# Patient Record
Sex: Female | Born: 1985 | Race: Black or African American | Hispanic: No | Marital: Single | Smoking: Former smoker
Health system: Southern US, Community
[De-identification: ages and names within clinical notes are randomized; demographics above are authoritative.]

## PROBLEM LIST (undated history)

## (undated) ENCOUNTER — Inpatient Hospital Stay (HOSPITAL_COMMUNITY): Payer: Self-pay

## (undated) DIAGNOSIS — K649 Unspecified hemorrhoids: Secondary | ICD-10-CM

## (undated) DIAGNOSIS — Z21 Asymptomatic human immunodeficiency virus [HIV] infection status: Secondary | ICD-10-CM

## (undated) DIAGNOSIS — F32A Depression, unspecified: Secondary | ICD-10-CM

## (undated) DIAGNOSIS — F329 Major depressive disorder, single episode, unspecified: Secondary | ICD-10-CM

## (undated) HISTORY — PX: DILATION AND CURETTAGE OF UTERUS: SHX78

## (undated) HISTORY — PX: SALPINGOOPHORECTOMY: SHX82

---
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
## 2006-02-20 ENCOUNTER — Emergency Department (HOSPITAL_COMMUNITY): Admission: EM | Admit: 2006-02-20 | Discharge: 2006-02-20 | Payer: Self-pay | Admitting: Family Medicine

## 2006-12-22 ENCOUNTER — Inpatient Hospital Stay (HOSPITAL_COMMUNITY): Admission: AD | Admit: 2006-12-22 | Discharge: 2006-12-22 | Payer: Self-pay | Admitting: Obstetrics

## 2007-01-03 ENCOUNTER — Ambulatory Visit (HOSPITAL_COMMUNITY): Admission: RE | Admit: 2007-01-03 | Discharge: 2007-01-03 | Payer: Self-pay | Admitting: Obstetrics & Gynecology

## 2007-02-21 ENCOUNTER — Ambulatory Visit (HOSPITAL_COMMUNITY): Admission: RE | Admit: 2007-02-21 | Discharge: 2007-02-21 | Payer: Self-pay | Admitting: Obstetrics & Gynecology

## 2007-04-04 ENCOUNTER — Inpatient Hospital Stay (HOSPITAL_COMMUNITY): Admission: AD | Admit: 2007-04-04 | Discharge: 2007-04-04 | Payer: Self-pay | Admitting: Obstetrics & Gynecology

## 2007-04-15 ENCOUNTER — Ambulatory Visit (HOSPITAL_COMMUNITY): Admission: RE | Admit: 2007-04-15 | Discharge: 2007-04-15 | Payer: Self-pay | Admitting: Obstetrics & Gynecology

## 2007-05-20 ENCOUNTER — Inpatient Hospital Stay (HOSPITAL_COMMUNITY): Admission: AD | Admit: 2007-05-20 | Discharge: 2007-05-20 | Payer: Self-pay | Admitting: Obstetrics

## 2007-05-26 ENCOUNTER — Inpatient Hospital Stay (HOSPITAL_COMMUNITY): Admission: AD | Admit: 2007-05-26 | Discharge: 2007-05-28 | Payer: Self-pay | Admitting: Obstetrics

## 2007-06-01 ENCOUNTER — Inpatient Hospital Stay (HOSPITAL_COMMUNITY): Admission: AD | Admit: 2007-06-01 | Discharge: 2007-06-01 | Payer: Self-pay | Admitting: Obstetrics & Gynecology

## 2007-06-28 ENCOUNTER — Inpatient Hospital Stay (HOSPITAL_COMMUNITY): Admission: AD | Admit: 2007-06-28 | Discharge: 2007-06-30 | Payer: Self-pay | Admitting: Obstetrics & Gynecology

## 2007-06-28 ENCOUNTER — Encounter: Payer: Self-pay | Admitting: Obstetrics & Gynecology

## 2008-01-30 ENCOUNTER — Emergency Department (HOSPITAL_COMMUNITY): Admission: EM | Admit: 2008-01-30 | Discharge: 2008-01-30 | Payer: Self-pay | Admitting: Emergency Medicine

## 2008-07-06 ENCOUNTER — Emergency Department (HOSPITAL_COMMUNITY): Admission: EM | Admit: 2008-07-06 | Discharge: 2008-07-07 | Payer: Self-pay | Admitting: Emergency Medicine

## 2008-08-27 ENCOUNTER — Emergency Department (HOSPITAL_COMMUNITY): Admission: EM | Admit: 2008-08-27 | Discharge: 2008-08-27 | Payer: Self-pay | Admitting: Emergency Medicine

## 2008-12-04 ENCOUNTER — Emergency Department (HOSPITAL_COMMUNITY): Admission: EM | Admit: 2008-12-04 | Discharge: 2008-12-04 | Payer: Self-pay | Admitting: Emergency Medicine

## 2009-05-27 ENCOUNTER — Emergency Department (HOSPITAL_COMMUNITY): Admission: EM | Admit: 2009-05-27 | Discharge: 2009-05-27 | Payer: Self-pay | Admitting: Emergency Medicine

## 2009-05-27 ENCOUNTER — Inpatient Hospital Stay (HOSPITAL_COMMUNITY): Admission: AD | Admit: 2009-05-27 | Discharge: 2009-05-27 | Payer: Self-pay | Admitting: Obstetrics

## 2009-06-04 ENCOUNTER — Encounter (INDEPENDENT_AMBULATORY_CARE_PROVIDER_SITE_OTHER): Payer: Self-pay | Admitting: *Deleted

## 2009-06-04 DIAGNOSIS — B20 Human immunodeficiency virus [HIV] disease: Secondary | ICD-10-CM

## 2009-06-04 LAB — CONVERTED CEMR LAB
Platelets: 228 10*3/uL
WBC: 7 10*3/uL

## 2009-06-28 ENCOUNTER — Encounter (INDEPENDENT_AMBULATORY_CARE_PROVIDER_SITE_OTHER): Payer: Self-pay | Admitting: *Deleted

## 2009-06-28 LAB — CONVERTED CEMR LAB
CD4 T Helper %: 28 %
HIV 1 RNA Quant: 1490 copies/mL
WBC: 7 10*3/uL

## 2009-07-08 ENCOUNTER — Encounter (INDEPENDENT_AMBULATORY_CARE_PROVIDER_SITE_OTHER): Payer: Self-pay | Admitting: *Deleted

## 2009-07-08 DIAGNOSIS — Z8619 Personal history of other infectious and parasitic diseases: Secondary | ICD-10-CM | POA: Insufficient documentation

## 2009-07-08 DIAGNOSIS — J45909 Unspecified asthma, uncomplicated: Secondary | ICD-10-CM | POA: Insufficient documentation

## 2009-07-08 DIAGNOSIS — A630 Anogenital (venereal) warts: Secondary | ICD-10-CM | POA: Insufficient documentation

## 2009-07-09 ENCOUNTER — Encounter (INDEPENDENT_AMBULATORY_CARE_PROVIDER_SITE_OTHER): Payer: Self-pay | Admitting: *Deleted

## 2009-07-19 ENCOUNTER — Ambulatory Visit: Payer: Self-pay | Admitting: Internal Medicine

## 2009-07-19 LAB — CONVERTED CEMR LAB
Albumin: 3.8 g/dL (ref 3.5–5.2)
Alkaline Phosphatase: 18 units/L — ABNORMAL LOW (ref 39–117)
BUN: 9 mg/dL (ref 6–23)
Basophils Absolute: 0 10*3/uL (ref 0.0–0.1)
Basophils Relative: 0 % (ref 0–1)
Chlamydia, Swab/Urine, PCR: NEGATIVE
Creatinine, Ser: 0.52 mg/dL (ref 0.40–1.20)
Eosinophils Absolute: 0.3 10*3/uL (ref 0.0–0.7)
Eosinophils Relative: 3 % (ref 0–5)
GC Probe Amp, Urine: NEGATIVE
Glucose, Bld: 81 mg/dL (ref 70–99)
HCT: 31.9 % — ABNORMAL LOW (ref 36.0–46.0)
HIV 1 RNA Quant: 3220 copies/mL — ABNORMAL HIGH (ref <48)
HIV-1 antibody: POSITIVE — AB
Hemoglobin: 10.4 g/dL — ABNORMAL LOW (ref 12.0–15.0)
Hep A Total Ab: NEGATIVE
Hep B S Ab: POSITIVE — AB
MCHC: 32.6 g/dL (ref 30.0–36.0)
Monocytes Absolute: 0.5 10*3/uL (ref 0.1–1.0)
RDW: 12.9 % (ref 11.5–15.5)
Total Bilirubin: 0.2 mg/dL — ABNORMAL LOW (ref 0.3–1.2)

## 2009-07-23 ENCOUNTER — Encounter: Payer: Self-pay | Admitting: Internal Medicine

## 2009-07-31 ENCOUNTER — Encounter: Payer: Self-pay | Admitting: Internal Medicine

## 2009-08-16 ENCOUNTER — Ambulatory Visit: Payer: Self-pay | Admitting: Internal Medicine

## 2009-08-16 ENCOUNTER — Ambulatory Visit (HOSPITAL_COMMUNITY): Admission: RE | Admit: 2009-08-16 | Discharge: 2009-08-16 | Payer: Self-pay | Admitting: Obstetrics & Gynecology

## 2009-09-13 ENCOUNTER — Ambulatory Visit (HOSPITAL_COMMUNITY): Admission: RE | Admit: 2009-09-13 | Discharge: 2009-09-13 | Payer: Self-pay | Admitting: Obstetrics & Gynecology

## 2009-09-19 ENCOUNTER — Encounter (INDEPENDENT_AMBULATORY_CARE_PROVIDER_SITE_OTHER): Payer: Self-pay | Admitting: *Deleted

## 2009-10-07 ENCOUNTER — Ambulatory Visit: Payer: Self-pay | Admitting: Internal Medicine

## 2009-10-07 LAB — CONVERTED CEMR LAB
BUN: 6 mg/dL (ref 6–23)
CO2: 25 meq/L (ref 19–32)
Calcium: 8.8 mg/dL (ref 8.4–10.5)
Chloride: 106 meq/L (ref 96–112)
Creatinine, Ser: 0.53 mg/dL (ref 0.40–1.20)
Glucose, Bld: 71 mg/dL (ref 70–99)
HIV 1 RNA Quant: 84 copies/mL — ABNORMAL HIGH (ref <48)
HIV-1 RNA Quant, Log: 1.92 — ABNORMAL HIGH (ref <1.68)
Hemoglobin: 9.5 g/dL — ABNORMAL LOW (ref 12.0–15.0)
Lymphocytes Relative: 21 % (ref 12–46)
Lymphs Abs: 2 10*3/uL (ref 0.7–4.0)
MCHC: 32.4 g/dL (ref 30.0–36.0)
Monocytes Absolute: 0.7 10*3/uL (ref 0.1–1.0)
Monocytes Relative: 7 % (ref 3–12)
Neutro Abs: 6.3 10*3/uL (ref 1.7–7.7)
RBC: 2.97 M/uL — ABNORMAL LOW (ref 3.87–5.11)

## 2009-11-07 ENCOUNTER — Telehealth (INDEPENDENT_AMBULATORY_CARE_PROVIDER_SITE_OTHER): Payer: Self-pay | Admitting: *Deleted

## 2009-11-20 ENCOUNTER — Ambulatory Visit (HOSPITAL_COMMUNITY): Admission: RE | Admit: 2009-11-20 | Discharge: 2009-11-20 | Payer: Self-pay | Admitting: Obstetrics & Gynecology

## 2009-12-12 ENCOUNTER — Encounter (INDEPENDENT_AMBULATORY_CARE_PROVIDER_SITE_OTHER): Payer: Self-pay | Admitting: *Deleted

## 2009-12-12 ENCOUNTER — Encounter: Payer: Self-pay | Admitting: Internal Medicine

## 2009-12-13 ENCOUNTER — Inpatient Hospital Stay (HOSPITAL_COMMUNITY): Admission: AD | Admit: 2009-12-13 | Discharge: 2009-12-13 | Payer: Self-pay | Admitting: Obstetrics & Gynecology

## 2009-12-16 ENCOUNTER — Inpatient Hospital Stay (HOSPITAL_COMMUNITY): Admission: AD | Admit: 2009-12-16 | Discharge: 2009-12-16 | Payer: Self-pay | Admitting: Obstetrics & Gynecology

## 2009-12-16 ENCOUNTER — Ambulatory Visit: Payer: Self-pay | Admitting: Nurse Practitioner

## 2009-12-24 ENCOUNTER — Encounter: Payer: Self-pay | Admitting: Internal Medicine

## 2009-12-25 ENCOUNTER — Inpatient Hospital Stay (HOSPITAL_COMMUNITY): Admission: AD | Admit: 2009-12-25 | Discharge: 2009-12-26 | Payer: Self-pay | Admitting: Obstetrics & Gynecology

## 2009-12-26 ENCOUNTER — Inpatient Hospital Stay (HOSPITAL_COMMUNITY): Admission: AD | Admit: 2009-12-26 | Discharge: 2009-12-29 | Payer: Self-pay | Admitting: Obstetrics

## 2009-12-31 ENCOUNTER — Telehealth: Payer: Self-pay | Admitting: Internal Medicine

## 2010-01-27 ENCOUNTER — Encounter (INDEPENDENT_AMBULATORY_CARE_PROVIDER_SITE_OTHER): Payer: Self-pay | Admitting: *Deleted

## 2010-02-28 ENCOUNTER — Ambulatory Visit: Payer: Self-pay | Admitting: Internal Medicine

## 2010-04-03 ENCOUNTER — Encounter (INDEPENDENT_AMBULATORY_CARE_PROVIDER_SITE_OTHER): Payer: Self-pay | Admitting: *Deleted

## 2010-04-13 ENCOUNTER — Encounter: Payer: Self-pay | Admitting: Obstetrics & Gynecology

## 2010-04-14 ENCOUNTER — Encounter: Payer: Self-pay | Admitting: Obstetrics & Gynecology

## 2010-04-14 ENCOUNTER — Encounter (INDEPENDENT_AMBULATORY_CARE_PROVIDER_SITE_OTHER): Payer: Self-pay | Admitting: *Deleted

## 2010-04-22 NOTE — Miscellaneous (Signed)
Summary: Bridge Counseling  Clinical Lists Changes  03/29/09-Bridge Counselors have received numerous referrals on client.  Client has missed numerous scheduled Bridge Counseling appts since 07/17/09.  Client may be frustrated or not wanting to receive bridge counseling services due to BC's having to report client to CPS after client left her children home a lone for almost an hour with parental care.  BC's closed client's file and will be willing work with client if she request bridge counseling services in the future.

## 2010-04-22 NOTE — Miscellaneous (Signed)
Summary: Orders Update  Clinical Lists Changes  Orders: Added new Referral order of Misc. Referral (Misc. Ref) - Signed 

## 2010-04-22 NOTE — Progress Notes (Signed)
Summary: Re: new medications  Phone Note Call from Patient   Caller: Patient Summary of Call: Pt called left message on voicemail: " I had my baby on 12-30-09. I would like to switch meds". Left message on machine for pt.  OV needed with labs.  Tomasita Morrow RN  December 31, 2009 11:02 AM   Initial call taken by: Tomasita Morrow RN,  December 31, 2009 11:00 AM

## 2010-04-22 NOTE — Miscellaneous (Signed)
Summary: Bridge Counselor Referral/for missed appt.  Clinical Lists Changes  Orders: Added new Referral order of Misc. Referral (Misc. Ref) - Signed

## 2010-04-22 NOTE — Miscellaneous (Signed)
Summary: HIPAA Restrictions  HIPAA Restrictions   Imported By: Florinda Marker 07/23/2009 15:51:24  _____________________________________________________________________  External Attachment:    Type:   Image     Comment:   External Document

## 2010-04-22 NOTE — Assessment & Plan Note (Signed)
Summary: new 042/preg 11 wks/   CC:  new patient and lab work.  History of Present Illness: Thisis the first ID clinic visit for [name] who is 3 months pregnant and who recently discovered that she is HIV (+). She has 2 other children at which time she as HIV (-). She is currenlty with a partner who is the father of the baby and she states that he told her he was HIV(-).   Preventive Screening-Counseling & Management  Alcohol-Tobacco     Alcohol drinks/day: 0     Smoking Status: current     Packs/Day: 0.5     Year Started: 2002  Caffeine-Diet-Exercise     Caffeine use/day: soda 3 per day     Does Patient Exercise: yes     Type of exercise: walking     Exercise (avg: min/session): 30-60     Times/week: 3  Safety-Violence-Falls     Seat Belt Use: yes      Sexual History:  currently monogamous.        Drug Use:  current and marijuana.     Current Allergies (reviewed today): No known allergies  Past History:  Past Medical History: Asthma  Social History: Sexual History:  currently monogamous Drug Use:  current, marijuana  Review of Systems  The patient denies anorexia, fever, and weight loss.    Vital Signs:  Patient profile:   25 year old female Height:      63 inches (160.02 cm) Weight:      160.0 pounds (72.73 kg) BMI:     28.45 Temp:     98.8 degrees F (37.11 degrees C) oral Pulse rate:   120 / minute BP sitting:   123 / 84  (right arm)  Vitals Entered By: [name] CMA Duncan Dull) (July 19, 2009 2:23 PM) CC: new patient, lab work Is Patient Diabetic? No Pain Assessment Patient in pain? no      Nutritional Status BMI of 25 - 29 = overweight Nutritional Status Detail appetite "good"  Have you ever been in a relationship where you felt threatened, hurt or afraid?No   Does patient need assistance? Functional Status Self care Ambulation Normal   Physical Exam  General:  alert, well-developed, well-nourished, and well-hydrated.   Head:   normocephalic and atraumatic.   Mouth:  pharynx pink and moist.   Lungs:  normal breath sounds.     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 We will obtain labs today including a genotype. Since she is pregnat she will need to initiate therapy. We will await the results of the genotype to determine the regimen.  I discussed the fact that if we can get the VL to <1000 and hopefully to <48 that she can have a normal vaginal delivery, otherwise she will need to have a c-section in order to protect the baby from being infected. I recommended that she be referred to High [facility] OB at [name] as they have experience delivering HIV (+) women and are familiar with the AZT protocols for the women and baby. The infants are usually followed at [name]. This is a decision she and her present MD will need to discuss. She will return in 3 weeks to get the results of her labs and get started on therapy. At that time I will go over potential side effects of the HIV meds. Orders: New  Patient Level III [phone]) T-CBC w/Diff [phone]) T-CD4SP [facility]) (CD4SP) T-Chlamydia  Probe, urine [phone]) T-GC Probe, urine [phone]) T-Comprehensive Metabolic Panel [phone]) T-Hepatitis B Surface Antigen [phone]) T-Hepatitis B Surface Antibody [phone]) T-Hepatitis C Antibody ([id number]) T-Hepatitis A Antibody ([id number]) T-HIV Antibody  (Reflex) [phone]) T-HIV1 Quant rflx Ultra or Genotype ([phone]) T-RPR (Syphilis) ([id number])  Patient Instructions: 1)  Please schedule a follow-up appointment in 3 weeks.

## 2010-04-22 NOTE — Miscellaneous (Signed)
Summary: RW update  Clinical Lists Changes  Observations: Added new observation of RWPARTICIP: Yes (12/12/2009 9:55)

## 2010-04-22 NOTE — Miscellaneous (Signed)
Summary: lab values updated  Clinical Lists Changes  Observations: Added new observation of CREATININE: 0.2 mg/dL (16/12/9602 54:09) Added new observation of PLATELETK/UL: 228 K/uL (06/28/2009 10:29) Added new observation of WBC COUNT: 7.0 10*3/microliter (06/28/2009 10:29) Added new observation of HGB: 11.8 g/dL (81/19/1478 29:56) Added new observation of GLUCOSE SER: 89 mg/dL (21/30/8657 84:69) Added new observation of HIV1RNA QA: 1490 copies/mL (06/28/2009 10:29) Added new observation of T-HELPER %: 28 % (06/28/2009 10:29) Added new observation of CD4 COUNT: 514 microliters (06/28/2009 10:29) Added new observation of PLATELETK/UL: 228 K/uL (06/04/2009 10:29) Added new observation of WBC COUNT: 7.0 10*3/microliter (06/04/2009 10:29) Added new observation of HGB: 11.8 g/dL (62/95/2841 32:44)      -  Date:  06/28/2009    CD4: 514    Glucose: 89

## 2010-04-22 NOTE — Progress Notes (Signed)
Summary: needs f/u  Phone Note Outgoing Call   Summary of Call: [facility] pt. kept. appt. 10/31/09, has another appt. 11/14/09.  Asked them to notify pt. to call this office to reschedule her appt. If pt. does not reschedule will be refer to [facility]. Initial call taken by: [name] CMA Duncan Dull),  November 07, 2009 9:56 AM  Follow-up for Phone Call        Pt. rescheduled for 12/10/09 at 3:15 Follow-up by: [name] CMA Duncan Dull),  November 20, 2009 12:31 PM

## 2010-04-22 NOTE — Consult Note (Signed)
Summary: [facility]  [facility]   Imported By: Florinda Marker 08/08/2009 09:18:38  _____________________________________________________________________  External Attachment:    Type:   Image     Comment:   External Document

## 2010-04-22 NOTE — Assessment & Plan Note (Signed)
Summary: fukam   CC:  follow-up visit and lab results.  History of Present Illness: Pt here for f/u.  She has an ultrasound schedule for this afternoon. She has been feeling well.  Preventive Screening-Counseling & Management  Alcohol-Tobacco     Alcohol drinks/day: 0     Smoking Status: current     Packs/Day: 0.5     Year Started: 2002  Caffeine-Diet-Exercise     Caffeine use/day: soda occasional     Does Patient Exercise: yes     Type of exercise: walking     Exercise (avg: min/session): 30-60     Times/week: 3  Safety-Violence-Falls     Seat Belt Use: yes      Sexual History:  currently monogamous.        Drug Use:  current and marijuana.     Updated Prior Medication List: COMBIVIR 150-300 MG TABS (LAMIVUDINE-ZIDOVUDINE) Take 1 tablet by mouth two times a day KALETRA 200-50 MG TABS (LOPINAVIR-RITONAVIR) Take 2 tablets by mouth two times a day  Current Allergies (reviewed today): No known allergies  Past History:  Past Medical History: Last updated: 07/19/2009 Asthma  Vital Signs:  Patient profile:   25 year old female Height:      63 inches (160.02 cm) Weight:      163.12 pounds (74.15 kg) BMI:     29.00 Temp:     98.2 degrees F (36.78 degrees C) oral Pulse rate:   92 / minute BP sitting:   116 / 76  (right arm)  Vitals Entered By: [name] CMA Duncan Dull) (Aug 16, 2009 10:10 AM) CC: follow-up visit, lab results Is Patient Diabetic? No Pain Assessment Patient in pain? no      Nutritional Status BMI of 25 - 29 = overweight Nutritional Status Detail appetite "great"  Does patient need assistance? Functional Status Self care Ambulation Normal Comments OBGYN was suppose to refer pt. to [facility]. but she has not heard anything back yet Has appt. today at [facility] for an ultrasound   Physical Exam  General:  alert, well-developed, well-nourished, and well-hydrated.   Head:  normocephalic and atraumatic.   Mouth:  pharynx pink and moist.    Lungs:  normal breath sounds.          Medication Adherence: 08/16/2009   Adherence to medications reviewed with patient. Counseling to provide adequate adherence provided   Prevention For Positives: 08/15/2009   Safe sex practices discussed with patient. Condoms offered.                             Impression & Recommendations:  Problem # 1:  HIV INFECTION (ICD-042) Genotype does not show any resistance. Will stat Kaletra and combivir.  Potential side effects discussed.  She will return in 6 weeks for repeat labs.  The need to take her meds every day was stressed. Diagnostics Reviewed:  HIV: REACTIVE (07/19/2009)   HIV-Western blot: Positive (07/19/2009)   WBC: 8.2 (07/19/2009)   Hgb: 10.4 (07/19/2009)   HCT: 31.9 (07/19/2009)   Platelets: 255 (07/19/2009) HIV genotype: See Comment (07/19/2009)   HIV-1 RNA: 3220 (07/19/2009)   HBSAg: NEG (07/19/2009)  Orders: Est. Patient Level III (99213)Future Orders: T-CD4SP ([facility]) (CD4SP) ... 09/27/2009 T-HIV Viral Load [id number]) ... 09/27/2009 T-Comprehensive Metabolic Panel [id number]) ... 09/27/2009 T-CBC w/Diff ([id number]) ... 09/27/2009  Medications Added to Medication List This Visit: 1)  Combivir 150-300 Mg Tabs (Lamivudine-zidovudine) .... Take 1 tablet by  mouth two times a day 2)  Kaletra 200-50 Mg Tabs (Lopinavir-ritonavir) .... Take 2 tablets by mouth two times a day  Patient Instructions: 1)  Please schedule a follow-up appointment in 8 weeks, 2weeks after labs.  Prescriptions: KALETRA 200-50 MG TABS (LOPINAVIR-RITONAVIR) Take 2 tablets by mouth two times a day  #120 x 5   Entered and Authorized by:   Yisroel Ramming MD   Signed by:   Yisroel Ramming MD on 08/16/2009   Method used:   Print then Give to Patient   RxID:   [id number] COMBIVIR 150-300 MG TABS (LAMIVUDINE-ZIDOVUDINE) Take 1 tablet by mouth two times a day  #60 x 5   Entered and Authorized by:   Yisroel Ramming MD   Signed by:   Yisroel Ramming  MD on 08/16/2009   Method used:   Print then Give to Patient   RxID:   [id number]

## 2010-04-22 NOTE — Miscellaneous (Signed)
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

## 2010-04-24 NOTE — Miscellaneous (Signed)
  Clinical Lists Changes  Observations: Added new observation of #CHILDDELHIV: 0  (04/14/2010 14:58) Added new observation of #CHILDDEL: 1  (04/14/2010 14:58) Added new observation of DELIV TYPE: Vaginal  (04/14/2010 14:58)

## 2010-04-24 NOTE — Miscellaneous (Signed)
  Clinical Lists Changes  Observations: Added new observation of DELIV TYPE: Unknown (04/03/2010 15:19)

## 2010-05-01 ENCOUNTER — Encounter (INDEPENDENT_AMBULATORY_CARE_PROVIDER_SITE_OTHER): Payer: Self-pay | Admitting: *Deleted

## 2010-05-05 ENCOUNTER — Other Ambulatory Visit: Payer: Self-pay | Admitting: Adult Health

## 2010-05-05 ENCOUNTER — Encounter: Payer: Self-pay | Admitting: Adult Health

## 2010-05-05 ENCOUNTER — Other Ambulatory Visit (INDEPENDENT_AMBULATORY_CARE_PROVIDER_SITE_OTHER): Payer: Medicaid Other

## 2010-05-05 DIAGNOSIS — B20 Human immunodeficiency virus [HIV] disease: Secondary | ICD-10-CM

## 2010-05-05 LAB — CONVERTED CEMR LAB
Alkaline Phosphatase: 26 units/L — ABNORMAL LOW (ref 39–117)
BUN: 13 mg/dL (ref 6–23)
Basophils Relative: 0 % (ref 0–1)
Creatinine, Ser: 0.68 mg/dL (ref 0.40–1.20)
Eosinophils Absolute: 0.1 10*3/uL (ref 0.0–0.7)
Glucose, Bld: 83 mg/dL (ref 70–99)
HCT: 38.3 % (ref 36.0–46.0)
Hemoglobin: 12.5 g/dL (ref 12.0–15.0)
Lymphs Abs: 2.1 10*3/uL (ref 0.7–4.0)
MCHC: 32.6 g/dL (ref 30.0–36.0)
MCV: 99.7 fL (ref 78.0–100.0)
Monocytes Absolute: 0.3 10*3/uL (ref 0.1–1.0)
Monocytes Relative: 5 % (ref 3–12)
RBC: 3.84 M/uL — ABNORMAL LOW (ref 3.87–5.11)
Sodium: 137 meq/L (ref 135–145)
Total Bilirubin: 0.3 mg/dL (ref 0.3–1.2)
WBC: 6.6 10*3/uL (ref 4.0–10.5)

## 2010-05-08 NOTE — Miscellaneous (Signed)
  Clinical Lists Changes  Observations: Added new observation of PAYOR: Medicaid (05/01/2010 15:08) Added new observation of LATINO/HISP: No (05/01/2010 15:08) Added new observation of RACE: African American (05/01/2010 15:08)

## 2010-05-14 NOTE — Miscellaneous (Signed)
  Clinical Lists Changes  Orders: Added new Test order of T-CBC w/Diff [phone]) - Signed Added new Test order of T-CD4SP [facility]) (CD4SP) - Signed Added new Test order of T-Comprehensive Metabolic Panel [phone]) - Signed Added new Test order of T-HIV Viral Load [phone]) - Signed     Process Orders Check Orders Results:     [facility]: Order checked:     Talmadge Chad NP NOT AUTHORIZED TO ORDER Tests Sent for requisitioning (May 05, 2010 10:02 AM):     05/05/2010: [facility] -- T-CBC w/Diff [[id number]] (signed)     05/05/2010: [facility] -- T-Comprehensive Metabolic Panel [[id number]] (signed)     05/05/2010: [facility] -- T-HIV Viral Load [phone] (signed)

## 2010-05-20 ENCOUNTER — Ambulatory Visit: Payer: Self-pay | Admitting: Adult Health

## 2010-06-05 LAB — CBC
HCT: 29.2 % — ABNORMAL LOW (ref 36.0–46.0)
HCT: 32.3 % — ABNORMAL LOW (ref 36.0–46.0)
HCT: 32.4 % — ABNORMAL LOW (ref 36.0–46.0)
Hemoglobin: 10.8 g/dL — ABNORMAL LOW (ref 12.0–15.0)
Hemoglobin: 10.9 g/dL — ABNORMAL LOW (ref 12.0–15.0)
MCH: 35.2 pg — ABNORMAL HIGH (ref 26.0–34.0)
MCH: 35.6 pg — ABNORMAL HIGH (ref 26.0–34.0)
MCH: 35.9 pg — ABNORMAL HIGH (ref 26.0–34.0)
MCHC: 33.7 g/dL (ref 30.0–36.0)
MCHC: 33.7 g/dL (ref 30.0–36.0)
MCV: 103.8 fL — ABNORMAL HIGH (ref 78.0–100.0)
MCV: 104.7 fL — ABNORMAL HIGH (ref 78.0–100.0)
MCV: 105.5 fL — ABNORMAL HIGH (ref 78.0–100.0)
MCV: 105.8 fL — ABNORMAL HIGH (ref 78.0–100.0)
Platelets: 178 10*3/uL (ref 150–400)
Platelets: 220 10*3/uL (ref 150–400)
RBC: 3 MIL/uL — ABNORMAL LOW (ref 3.87–5.11)
RBC: 3.09 MIL/uL — ABNORMAL LOW (ref 3.87–5.11)
RDW: 16.2 % — ABNORMAL HIGH (ref 11.5–15.5)
RDW: 16.2 % — ABNORMAL HIGH (ref 11.5–15.5)
RDW: 16.6 % — ABNORMAL HIGH (ref 11.5–15.5)
WBC: 7.6 10*3/uL (ref 4.0–10.5)
WBC: 7.7 10*3/uL (ref 4.0–10.5)
WBC: 7.9 10*3/uL (ref 4.0–10.5)

## 2010-06-05 LAB — COMPREHENSIVE METABOLIC PANEL
AST: 17 U/L (ref 0–37)
Alkaline Phosphatase: 68 U/L (ref 39–117)
CO2: 26 mEq/L (ref 19–32)
Chloride: 103 mEq/L (ref 96–112)
Creatinine, Ser: 0.54 mg/dL (ref 0.4–1.2)
GFR calc Af Amer: >60 mL/min (ref >60)
GFR calc non Af Amer: >60 mL/min (ref >60)
Potassium: 3.5 mEq/L (ref 3.5–5.1)
Total Bilirubin: 0.7 mg/dL (ref 0.3–1.2)

## 2010-06-15 LAB — POCT URINALYSIS DIP (DEVICE)
Glucose, UA: NEGATIVE mg/dL
Hgb urine dipstick: NEGATIVE
Nitrite: NEGATIVE
Protein, ur: NEGATIVE mg/dL
Specific Gravity, Urine: 1.015 (ref 1.005–1.030)
Urobilinogen, UA: 0.2 mg/dL (ref 0.0–1.0)
pH: 8.5 — ABNORMAL HIGH (ref 5.0–8.0)

## 2010-06-15 LAB — HCG, QUANTITATIVE, PREGNANCY: hCG, Beta Chain, Quant, S: 71912 m[IU]/mL — ABNORMAL HIGH (ref <5)

## 2010-06-15 LAB — CBC
Hemoglobin: 13.2 g/dL (ref 12.0–15.0)
MCHC: 33.5 g/dL (ref 30.0–36.0)
MCV: 91.3 fL (ref 78.0–100.0)
RBC: 4.31 MIL/uL (ref 3.87–5.11)
WBC: 7.8 10*3/uL (ref 4.0–10.5)

## 2010-06-15 LAB — URINE CULTURE: Culture: NO GROWTH

## 2010-06-15 LAB — URINALYSIS, ROUTINE W REFLEX MICROSCOPIC
Bilirubin Urine: NEGATIVE
Glucose, UA: NEGATIVE mg/dL
Hgb urine dipstick: NEGATIVE
Ketones, ur: NEGATIVE mg/dL
Protein, ur: NEGATIVE mg/dL
pH: 7 (ref 5.0–8.0)

## 2010-06-15 LAB — WET PREP, GENITAL: Yeast Wet Prep HPF POC: NONE SEEN

## 2010-06-15 LAB — ABO/RH: ABO/RH(D): O POS

## 2010-06-27 LAB — WET PREP, GENITAL
Trich, Wet Prep: NONE SEEN
Yeast Wet Prep HPF POC: NONE SEEN

## 2010-06-27 LAB — DIFFERENTIAL
Eosinophils Relative: 3 % (ref 0–5)
Lymphocytes Relative: 24 % (ref 12–46)
Monocytes Absolute: 0.6 10*3/uL (ref 0.1–1.0)
Monocytes Relative: 6 % (ref 3–12)
Neutro Abs: 6.6 10*3/uL (ref 1.7–7.7)

## 2010-06-27 LAB — URINE MICROSCOPIC-ADD ON

## 2010-06-27 LAB — CBC
HCT: 39.1 % (ref 36.0–46.0)
Hemoglobin: 13.1 g/dL (ref 12.0–15.0)
RBC: 4.28 MIL/uL (ref 3.87–5.11)
RDW: 13.8 % (ref 11.5–15.5)

## 2010-06-27 LAB — URINALYSIS, ROUTINE W REFLEX MICROSCOPIC
Bilirubin Urine: NEGATIVE
Glucose, UA: NEGATIVE mg/dL
Hgb urine dipstick: NEGATIVE
Ketones, ur: NEGATIVE mg/dL
Specific Gravity, Urine: 1.022 (ref 1.005–1.030)
pH: 6.5 (ref 5.0–8.0)

## 2010-06-27 LAB — URINE CULTURE: Colony Count: >=100000

## 2010-06-27 LAB — GC/CHLAMYDIA PROBE AMP, GENITAL: GC Probe Amp, Genital: POSITIVE — AB

## 2010-06-30 LAB — POCT PREGNANCY, URINE: Preg Test, Ur: NEGATIVE

## 2010-07-02 LAB — CBC
HCT: 45.4 % (ref 36.0–46.0)
Hemoglobin: 15.4 g/dL — ABNORMAL HIGH (ref 12.0–15.0)
MCHC: 34 g/dL (ref 30.0–36.0)
RBC: 4.98 MIL/uL (ref 3.87–5.11)
RDW: 12.8 % (ref 11.5–15.5)

## 2010-07-02 LAB — URINALYSIS, ROUTINE W REFLEX MICROSCOPIC
Nitrite: NEGATIVE
Protein, ur: >300 mg/dL — AB
Specific Gravity, Urine: 1.034 — ABNORMAL HIGH (ref 1.005–1.030)
Urobilinogen, UA: 1 mg/dL (ref 0.0–1.0)

## 2010-07-02 LAB — DIFFERENTIAL
Basophils Absolute: 0 10*3/uL (ref 0.0–0.1)
Basophils Relative: 0 % (ref 0–1)
Eosinophils Absolute: 0 10*3/uL (ref 0.0–0.7)
Neutro Abs: 3.7 10*3/uL (ref 1.7–7.7)
Neutrophils Relative %: 67 % (ref 43–77)

## 2010-07-02 LAB — COMPREHENSIVE METABOLIC PANEL
ALT: 14 U/L (ref 0–35)
Alkaline Phosphatase: 28 U/L — ABNORMAL LOW (ref 39–117)
BUN: 15 mg/dL (ref 6–23)
CO2: 24 mEq/L (ref 19–32)
GFR calc non Af Amer: >60 mL/min (ref >60)
Glucose, Bld: 83 mg/dL (ref 70–99)
Potassium: 3.9 mEq/L (ref 3.5–5.1)
Sodium: 130 mEq/L — ABNORMAL LOW (ref 135–145)

## 2010-07-02 LAB — URINE MICROSCOPIC-ADD ON

## 2010-07-02 LAB — GLUCOSE, CAPILLARY: Glucose-Capillary: 97 mg/dL (ref 70–99)

## 2010-08-01 ENCOUNTER — Ambulatory Visit: Payer: Medicaid Other | Admitting: Adult Health

## 2010-08-05 NOTE — Discharge Summary (Signed)
[name], [name]              ACCOUNT NO.:  [id number]   MEDICAL RECORD NO.:  [id number]          PATIENT TYPE:  INP   LOCATION:  9155                          FACILITY:  [facility]   PHYSICIAN:  Roseanna Rainbow, M.D.DATE OF BIRTH:  1985-07-25   DATE OF ADMISSION:  05/26/2007  DATE OF DISCHARGE:  05/28/2007                               DISCHARGE SUMMARY   CHIEF COMPLAINT:  The patient is a 25 year old para 1 with an  intrauterine pregnancy at 31 plus weeks complaining of uterine  contractions.  Please see the dictated history and physical for further  details.   HOSPITAL COURSE:  The patient was admitted.  She received a course of  betamethasone.  An ultrasound on May 27, 2007, demonstrated an  estimated fetal weight percentile at the 71st percentile and the  cervical length was greater than 3 cm long.  She remained without  regular contractions, and she was discharged to home on May 28, 2007.   DISCHARGE DIAGNOSIS:  Intrauterine pregnancy at 31 plus weeks,  threatened preterm labor.   CONDITION:  Stable.   DIET:  Regular.   ACTIVITY:  Pelvic rest, modified bed rest.   MEDICATIONS:  Resume preadmission medications.   DISPOSITION:  The patient was to follow up in the office in 1 week.      Roseanna Rainbow, M.D.  Electronically Signed     LAJ/MEDQ  D:  06/24/2007  T:  06/25/2007  Job:  [id number]

## 2010-08-05 NOTE — H&P (Signed)
NAMEJANEKA, [name]              ACCOUNT NO.:  [id number]   MEDICAL RECORD NO.:  [id number]          PATIENT TYPE:  INP   LOCATION:  9139                          [facility]ACILITY[facility]  [facility]   PHYSICIAN:  Roseanna Rainbow, M.D.DATE OF BIRTH:  1986/03/01   DATE OF ADMISSION:  06/28/2007  DATE OF DISCHARGE:                              HISTORY & PHYSICAL   CHIEF COMPLAINT:  The patient is a 25 year old para 1 with an estimated  date of confinement of May 2 with an intrauterine pregnancy at 36+ weeks  complaining of ruptured membranes.   History of Present Illness, please see the above.   ALLERGIES:  No known drug allergies.   MEDICATIONS:  Please see the medication reconciliation form.   OBSTETRIC RISK FACTORS:  Asthma, positive Chlamydia probe.   PRENATAL LABORATORIES:  Quad screen normal, Chlamydia probe positive  October 2008.  Urine culture sensitivity:  No uropathogens.  Pap smear  negative.  GC probe negative.  Fetal fibronectin negative on April 14, 2007.  1-hour GCT 88.  Hepatitis B surface antigen negative.  Hematocrit  34.5, hemoglobin 10.9.  HIV nonreactive.  Blood type is O+, antibody  screen negative, RPR nonreactive, rubella immune.  Sickle cell negative.  Varicella immune.   PAST OBSTETRICAL HISTORY:  In December 2006 she was delivered of a live  born female vaginal delivery 6 pounds 10 ounces, full-term, no  complications.   Past GYN history, please see the above.   Past Medical History, please see the above.   PAST SURGICAL HISTORY:  No previous surgeries.   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  Recently quit using  cigarettes.  Denies illicit drug use.   FAMILY HISTORY:  Adult-onset diabetes, hypertension, kidney failure,  migraine headaches.   PHYSICAL EXAMINATION:  VITAL SIGNS:  Stable, afebrile.  Fetal heart  tracing reassuring.  Tocodynamometer:  Irregular uterine contractions.  GENERAL:  Minimal distress.  PELVIC:  Per the RN gross  rupture of membranes.   ASSESSMENT:  Primipara at 36+ weeks early labor.  Fetal heart tracing  consistent with fetal well-being.  GBS negative.   PLAN:  Admission, expectant management for now.      Roseanna Rainbow, M.D.  Electronically Signed     LAJ/MEDQ  D:  06/28/2007  T:  06/29/2007  Job:  [id number]

## 2010-08-05 NOTE — H&P (Signed)
[name], [name]              ACCOUNT NO.:  [id number]   MEDICAL RECORD NO.:  [id number]          PATIENT TYPE:  INP   LOCATION:  9155                          [facility]ILITY:  [facility]   PHYSICIAN:  Roseanna Rainbow, M.D.DATE OF BIRTH:  06/26/1985   DATE OF ADMISSION:  05/26/2007  DATE OF DISCHARGE:                              HISTORY & PHYSICAL   CHIEF COMPLAINT:  The patient is a 25 year old, para 1 with an  intrauterine pregnancy at 31+ weeks complaining of uterine contractions.   HISTORY OF PRESENT ILLNESS:  Please see the above.  The patient had  similar complaints several days prior.  She had also had vaginal  bleeding.  She presented to the maternity admissions unit and her  cervical length ultrasound was normal.  She denies any vaginal bleeding  today or rupture of membranes.  She does complain of dysuria.   ALLERGIES:  No known drug allergies.   MEDICATIONS:  Please see the medication reconciliation form.   PRENATAL LABS:  Chlamydia positive October 2008, treated. Several urine  culture and sensitivities insignificant growth.  Quad screen negative.  Pap smear negative.  Fetal fibronectin negative on January 22.  GC probe  negative.  1-hour GTT 88.  Hepatitis B surface antigen negative,  hematocrit 34.5, hemoglobin 10.9, HIV nonreactive. Blood type is O+,  antibody screen negative, platelets 269,000.  RPR nonreactive, rubella  immune.  Sickle cell negative.  Varicella immune.   PAST GYN HISTORY:  Noncontributory.   PAST MEDICAL HISTORY:  Asthma.   PAST SURGICAL HISTORY:  No previous surgeries.   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  She is single.  She denies alcohol usage, recently stopped smoking cigarettes.  Denies  illicit drug use.   FAMILY HISTORY:  Adult-onset diabetes, hypertension, kidney failure,  migraine headaches.   PAST OBSTETRICAL HISTORY:  In December 2006, she was delivered of a live  born female, vaginal delivery 6 pounds 10  ounces, full term.   PHYSICAL EXAM:  VITAL SIGNS:  Stable, afebrile.  Fetal heart rate by  Doppler 140s. Fetal heart tracing in the office minimally reactive.  Tocodynamometer with minimal uterine activity.  ABDOMEN:  Gravid, nontender.  Sterile vaginal exam by me, the cervix is 2 cm dilated, long, soft.  Informal ultrasound confirms a cephalic presentation.   ASSESSMENT:  1. Intrauterine pregnancy at 31+ weeks.  2. Threatened preterm labor.   PLAN:  1. Admission for observation.  2. Possible tocolytics as needed.  3. Will also order a course of steroids.      Roseanna Rainbow, M.D.  Electronically Signed     LAJ/MEDQ  D:  05/27/2007  T:  05/28/2007  Job:  [id number]

## 2010-08-08 NOTE — H&P (Signed)
[name], [name]              ACCOUNT NO.:  [id number]   MEDICAL RECORD NO.:  [id number]          PATIENT TYPE:  INP   LOCATION:  9168                          [facility]ILITY[facility]  [facility]   PHYSICIAN:  Roseanna Rainbow, M.D.DATE OF BIRTH:  [birth year]   DATE OF ADMISSION:  03/12/2005  DATE OF DISCHARGE:                                HISTORY & PHYSICAL   CHIEF COMPLAINT:  The patient an 25 year old gravida 1, para 0, with an  estimated date of confinement of March 20, 2005 with an intrauterine  pregnancy at 38+ weeks with pregnancy-induced hypertension for induction of  labor.   HISTORY OF PRESENT ILLNESS:  Please see the above.  Initial blood pressures  during the second trimester of pregnancy were 130's-140's/80's.  However,  for approximately two weeks prior to presentation her blood pressures have  been in the 140's-150's/80's-90's.  Serial laboratory studies have not been  consistent with pre-eclampsia or HELP syndrome.  Fetal testing has been  reassuring.   ALLERGIES:  No known drug allergies.   MEDICATIONS:  Prenatal vitamins.   LABORATORY DATA:  Blood type is O positive, antibody screen negative,  hemoglobin 10.5, hematocrit 32.1, platelet count 281,000.  Gonorrhea and  Chlamydia negative.  One hour GTT 73.  HBsAg negative.  HIV negative. RPR  nonreactive. Rubella immune.  Sickle cell negative.  Urine culture and  sensitivity no growth.  An ultrasound at 16 weeks, 3 days was a normal  anatomic survey, no previa.  24 hour urine on March 03, 2005 with 253 mg  of protein.  PIH panel normal.  Urinalysis no protein, moderate leukocyte  esterase.   PAST OBSTETRICAL HISTORY:  Unremarkable.   PAST MEDICAL HISTORY:  No significant history of medical disease.   PAST SURGICAL HISTORY:  No previous surgery.   SOCIAL HISTORY:  She is a Consulting civil engineer, a Holiday representative at [employer].  She does  not give any significant history of alcohol usage. She has no significant  smoking history.   She denies illicit drug use.   FAMILY HISTORY:  Adult onset diabetes.   PHYSICAL EXAMINATION:  VITAL SIGNS:  Stable, afebrile.  Blood pressure  148/111.  Fetal heart tracing reassuring.  Tocodynamometer irregular uterine  contractions.  PELVIC:  Sterile vaginal examination as per the R. N. Cervix is 2 cm  dilated, 50% effaced with vertex at a 0 station.   ASSESSMENT:  Primigravida at term with mild pregnancy-induced hypertension,  borderline Bishop's score.  Fetal heart tracing consistent with fetal well  being.   PLAN:  Admission.  Cervical ripening to be followed by Pitocin.      Roseanna Rainbow, M.D.  Electronically Signed     LAJ/MEDQ  D:  03/13/2005  T:  03/13/2005  Job:  [id number]

## 2010-08-19 ENCOUNTER — Ambulatory Visit: Payer: Medicaid Other | Admitting: Adult Health

## 2010-08-20 ENCOUNTER — Ambulatory Visit (INDEPENDENT_AMBULATORY_CARE_PROVIDER_SITE_OTHER): Payer: Medicaid Other | Admitting: Adult Health

## 2010-08-20 ENCOUNTER — Encounter: Payer: Self-pay | Admitting: Adult Health

## 2010-08-20 DIAGNOSIS — B20 Human immunodeficiency virus [HIV] disease: Secondary | ICD-10-CM

## 2010-08-20 DIAGNOSIS — Z21 Asymptomatic human immunodeficiency virus [HIV] infection status: Secondary | ICD-10-CM

## 2010-08-20 DIAGNOSIS — Z79899 Other long term (current) drug therapy: Secondary | ICD-10-CM

## 2010-08-20 LAB — CBC WITH DIFFERENTIAL/PLATELET
Eosinophils Absolute: 0.2 10*3/uL (ref 0.0–0.7)
Hemoglobin: 12.3 g/dL (ref 12.0–15.0)
Lymphocytes Relative: 26 % (ref 12–46)
Lymphs Abs: 1.8 10*3/uL (ref 0.7–4.0)
Monocytes Relative: 6 % (ref 3–12)
Neutro Abs: 4.5 10*3/uL (ref 1.7–7.7)
Neutrophils Relative %: 65 % (ref 43–77)
Platelets: 252 10*3/uL (ref 150–400)
RBC: 4.12 MIL/uL (ref 3.87–5.11)
WBC: 6.8 10*3/uL (ref 4.0–10.5)

## 2010-08-20 NOTE — Progress Notes (Signed)
  Subjective:    Patient ID: [name], female    DOB: May 13, 1985, 25 y.o.   MRN: [id number]  HPI Presents to clinic for followup from labs that were obtained. Postpartum in February 2012. During pregnancy, she was on Kaletra and Combivir, but stopped therapy after she delivered her child and did not have any further followup since then. She denies any specific complaints at present, and denies any documented HIV associated problems since stopping her therapy.  Review of Systems  Constitutional: Negative.   HENT: Negative.   Eyes: Negative.   Respiratory: Negative.   Cardiovascular: Negative.   Gastrointestinal: Negative.   Genitourinary: Negative.   Musculoskeletal: Negative.   Skin: Negative.   Neurological: Negative.   Hematological: Negative.   Psychiatric/Behavioral: Negative.        Objective:   Physical Exam  Constitutional: She is oriented to person, place, and time. She appears well-developed and well-nourished.  HENT:  Head: Normocephalic and atraumatic.  Eyes: Conjunctivae and EOM are normal. Pupils are equal, round, and reactive to light.  Neck: Normal range of motion. Neck supple.  Cardiovascular: Normal rate and regular rhythm.   Pulmonary/Chest: Effort normal and breath sounds normal.  Abdominal: Soft. Bowel sounds are normal.  Musculoskeletal: Normal range of motion.  Neurological: She is alert and oriented to person, place, and time. No cranial nerve deficit. She exhibits normal muscle tone. Coordination normal.  Skin: Skin is warm and dry.  Psychiatric: She has a normal mood and affect. Her behavior is normal. Judgment and thought content normal.          Assessment & Plan:   1. HIV. Her labs obtained in February 2012 show a CD4 count of 540 at 29% with a viral load of 2080 copies/mL. While this is still below viral load or CD4 counts at this point show some decline from when she was pregnant. However, this may be not easy to assess being that this  was immediately postpartum. Before discussing any possibilities or needs for antiretroviral therapy, we should obtain repeat staging labs, with a viral load, reflex to a genotype to determine any problems, that might exist, and see whether she remains virologically an immunologically stable. At that point, we may discuss her feelings about antiretroviral therapy, now, or for future treatment options. We will obtain labs today, and ask that she return in 2 weeks for followup.  She verbally acknowledged this and agreed with plan of care.

## 2010-08-21 LAB — LIPID PANEL
Cholesterol: 151 mg/dL (ref 0–200)
LDL Cholesterol: 86 mg/dL (ref 0–99)
Total CHOL/HDL Ratio: 2.8 Ratio
VLDL: 12 mg/dL (ref 0–40)

## 2010-08-21 LAB — COMPLETE METABOLIC PANEL WITH GFR
ALT: 11 U/L (ref 0–35)
AST: 16 U/L (ref 0–37)
Albumin: 4 g/dL (ref 3.5–5.2)
Alkaline Phosphatase: 27 U/L — ABNORMAL LOW (ref 39–117)
BUN: 10 mg/dL (ref 6–23)
Potassium: 4.6 mEq/L (ref 3.5–5.3)
Sodium: 140 mEq/L (ref 135–145)

## 2010-08-21 LAB — HIV-1 RNA ULTRAQUANT REFLEX TO GENTYP+
HIV 1 RNA Quant: 5340 copies/mL — ABNORMAL HIGH (ref <20)
HIV-1 RNA Quant, Log: 3.73 {Log} — ABNORMAL HIGH (ref <1.30)

## 2010-08-21 LAB — T-HELPER CELL (CD4) - (RCID CLINIC ONLY): CD4 % Helper T Cell: 32 % — ABNORMAL LOW (ref 33–55)

## 2010-09-03 ENCOUNTER — Ambulatory Visit: Payer: Medicaid Other | Admitting: Adult Health

## 2010-09-05 ENCOUNTER — Ambulatory Visit (INDEPENDENT_AMBULATORY_CARE_PROVIDER_SITE_OTHER): Payer: Medicaid Other | Admitting: Internal Medicine

## 2010-09-05 ENCOUNTER — Encounter: Payer: Self-pay | Admitting: Internal Medicine

## 2010-09-05 VITALS — BP 124/84 | HR 87 | Temp 98.1°F | Wt 169.5 lb

## 2010-09-05 DIAGNOSIS — Z23 Encounter for immunization: Secondary | ICD-10-CM

## 2010-09-05 DIAGNOSIS — B20 Human immunodeficiency virus [HIV] disease: Secondary | ICD-10-CM

## 2010-09-05 MED ORDER — ATAZANAVIR SULFATE 150 MG PO CAPS: 300.0000 mg | ORAL_CAPSULE | Freq: Every day | ORAL | Status: DC

## 2010-09-05 MED ORDER — EMTRICITABINE-TENOFOVIR DF 200-300 MG PO TABS: 1.0000 | ORAL_TABLET | Freq: Every day | ORAL | Status: DC

## 2010-09-05 MED ORDER — RITONAVIR 100 MG PO TABS: 100.0000 mg | ORAL_TABLET | Freq: Every day | ORAL | Status: DC

## 2010-09-05 NOTE — Assessment & Plan Note (Addendum)
HIV - discussed at length her options for treatment or waiting, though would need to soon start regardless. I discussed that with any ARV she needs to take daily.  She states that she is ready to take meds daily.  Due to her potential for pregnancy in the future, I will opt for boosted Reyataz with Truvada.  I discussed side effects with her extensively.  I discussed that it is long term.  She voiced her understanding and is eager to start.  Will follow up in 6 weeks following labs.  Refer for PAP.

## 2010-09-05 NOTE — Progress Notes (Signed)
  Subjective:    Patient ID: [name], female    DOB: 1985/10/25, 25 y.o.   MRN: [id number]  HPI 25 year old previously on combivir and Kaletra with pregnancy 1 year ago and then stopped here reestablishing care, now her second visit.  Had recent labs at that visit with genotype wild type, CD4 560 and vl 5500.  Patient has had no recent illnesses or issues.  Her baby from last pregnancy passed away.  No recent STIs, is not on birth control.     Review of Systems  Constitutional: Negative.   HENT: Negative.   Eyes: Negative.   Respiratory: Negative.   Cardiovascular: Negative.   Gastrointestinal: Negative.   Genitourinary: Negative.   Musculoskeletal: Negative.   Skin: Negative.   Neurological: Negative.   Hematological: Negative.   Psychiatric/Behavioral: Negative.        Objective:   Physical Exam  Constitutional: She appears well-developed and well-nourished.  Eyes: No scleral icterus.  Cardiovascular: Normal rate, regular rhythm and normal heart sounds.  Exam reveals no gallop.   No murmur heard. Pulmonary/Chest: Effort normal and breath sounds normal. No respiratory distress. She has no wheezes.  Abdominal: Soft. Bowel sounds are normal. She exhibits no distension.  Lymphadenopathy:    She has no cervical adenopathy.  Skin: Skin is warm and dry. No erythema.  Psychiatric: She has a normal mood and affect. Her behavior is normal.          Assessment & Plan:

## 2010-10-06 ENCOUNTER — Other Ambulatory Visit: Payer: Medicaid Other

## 2010-12-11 LAB — COMPREHENSIVE METABOLIC PANEL
ALT: 11
AST: 12
CO2: 25
Chloride: 106
GFR calc Af Amer: >60
GFR calc non Af Amer: >60
Potassium: 4
Sodium: 136
Total Bilirubin: 0.3

## 2010-12-11 LAB — URINALYSIS, ROUTINE W REFLEX MICROSCOPIC
Glucose, UA: NEGATIVE
Ketones, ur: NEGATIVE
pH: 7.5

## 2010-12-11 LAB — WET PREP, GENITAL

## 2010-12-11 LAB — URINE MICROSCOPIC-ADD ON

## 2010-12-11 LAB — CBC
RBC: 3.34 — ABNORMAL LOW
WBC: 12.4 — ABNORMAL HIGH

## 2010-12-12 LAB — WET PREP, GENITAL

## 2010-12-15 LAB — CBC
Hemoglobin: 10.3 — ABNORMAL LOW
MCHC: 34.5
MCV: 89.9
RBC: 3.33 — ABNORMAL LOW

## 2010-12-15 LAB — URINALYSIS, ROUTINE W REFLEX MICROSCOPIC
Glucose, UA: NEGATIVE
Nitrite: NEGATIVE
Protein, ur: NEGATIVE
Urobilinogen, UA: 0.2
pH: 6.5

## 2010-12-15 LAB — URINE CULTURE: Special Requests: NEGATIVE

## 2010-12-15 LAB — URINE MICROSCOPIC-ADD ON: RBC / HPF: NONE SEEN

## 2010-12-16 LAB — CBC
HCT: 29.2 — ABNORMAL LOW
MCHC: 34.1
MCHC: 34.2
MCV: 90.9
Platelets: 136 — ABNORMAL LOW
Platelets: 161
RBC: 3.58 — ABNORMAL LOW
RDW: 14.3

## 2010-12-16 LAB — RPR: RPR Ser Ql: NONREACTIVE

## 2011-01-01 LAB — URINALYSIS, ROUTINE W REFLEX MICROSCOPIC
Bilirubin Urine: NEGATIVE
Nitrite: NEGATIVE
Protein, ur: NEGATIVE
Specific Gravity, Urine: 1.01
Urobilinogen, UA: 0.2

## 2011-01-01 LAB — URINE MICROSCOPIC-ADD ON

## 2011-01-06 ENCOUNTER — Other Ambulatory Visit: Payer: Self-pay | Admitting: Obstetrics & Gynecology

## 2011-01-06 ENCOUNTER — Emergency Department (HOSPITAL_COMMUNITY): Payer: Medicaid Other

## 2011-01-06 ENCOUNTER — Encounter (HOSPITAL_COMMUNITY): Payer: Self-pay | Admitting: *Deleted

## 2011-01-06 ENCOUNTER — Inpatient Hospital Stay (HOSPITAL_COMMUNITY)
Admission: AD | Admit: 2011-01-06 | Discharge: 2011-01-09 | DRG: 777 | Disposition: A | Discharge status: Home / Self Care | Payer: Medicaid Other | Source: Ambulatory Visit | Attending: Obstetrics & Gynecology | Admitting: Obstetrics & Gynecology

## 2011-01-06 ENCOUNTER — Encounter (HOSPITAL_COMMUNITY)
Admission: AD | Disposition: A | Discharge status: Home / Self Care | Payer: Self-pay | Source: Ambulatory Visit | Attending: Obstetrics & Gynecology

## 2011-01-06 ENCOUNTER — Encounter (HOSPITAL_COMMUNITY): Payer: Self-pay | Admitting: Anesthesiology

## 2011-01-06 ENCOUNTER — Inpatient Hospital Stay (HOSPITAL_COMMUNITY): Payer: Medicaid Other | Admitting: Anesthesiology

## 2011-01-06 ENCOUNTER — Emergency Department (HOSPITAL_COMMUNITY)
Admission: EM | Admit: 2011-01-06 | Discharge: 2011-01-06 | Disposition: A | Discharge status: Home / Self Care | Payer: Medicaid Other | Source: Home / Self Care | Attending: Emergency Medicine | Admitting: Emergency Medicine

## 2011-01-06 DIAGNOSIS — Z5331 Laparoscopic surgical procedure converted to open procedure: Secondary | ICD-10-CM

## 2011-01-06 DIAGNOSIS — B20 Human immunodeficiency virus [HIV] disease: Secondary | ICD-10-CM

## 2011-01-06 DIAGNOSIS — O009 Unspecified ectopic pregnancy without intrauterine pregnancy: Secondary | ICD-10-CM | POA: Diagnosis present

## 2011-01-06 DIAGNOSIS — D62 Acute posthemorrhagic anemia: Secondary | ICD-10-CM | POA: Diagnosis present

## 2011-01-06 DIAGNOSIS — R100 Acute abdomen: Secondary | ICD-10-CM

## 2011-01-06 DIAGNOSIS — D500 Iron deficiency anemia secondary to blood loss (chronic): Secondary | ICD-10-CM | POA: Diagnosis present

## 2011-01-06 DIAGNOSIS — R110 Nausea: Secondary | ICD-10-CM | POA: Insufficient documentation

## 2011-01-06 DIAGNOSIS — N83209 Unspecified ovarian cyst, unspecified side: Secondary | ICD-10-CM | POA: Diagnosis present

## 2011-01-06 DIAGNOSIS — O00109 Unspecified tubal pregnancy without intrauterine pregnancy: Principal | ICD-10-CM | POA: Diagnosis present

## 2011-01-06 DIAGNOSIS — N949 Unspecified condition associated with female genital organs and menstrual cycle: Secondary | ICD-10-CM | POA: Insufficient documentation

## 2011-01-06 HISTORY — PX: LAPAROSCOPY: SHX197

## 2011-01-06 HISTORY — PX: LAPAROTOMY: SHX154

## 2011-01-06 LAB — HCG, QUANTITATIVE, PREGNANCY: hCG, Beta Chain, Quant, S: 2683 m[IU]/mL — ABNORMAL HIGH (ref <5)

## 2011-01-06 LAB — COMPREHENSIVE METABOLIC PANEL
Albumin: 3.6 g/dL (ref 3.5–5.2)
BUN: 10 mg/dL (ref 6–23)
Creatinine, Ser: 0.63 mg/dL (ref 0.50–1.10)
GFR calc Af Amer: >90 mL/min (ref >90)
Glucose, Bld: 151 mg/dL — ABNORMAL HIGH (ref 70–99)
Total Protein: 7.4 g/dL (ref 6.0–8.3)

## 2011-01-06 LAB — DIFFERENTIAL
Basophils Absolute: 0 10*3/uL (ref 0.0–0.1)
Eosinophils Relative: 2 % (ref 0–5)
Lymphocytes Relative: 25 % (ref 12–46)
Lymphs Abs: 2 10*3/uL (ref 0.7–4.0)
Monocytes Absolute: 0.3 10*3/uL (ref 0.1–1.0)
Monocytes Relative: 4 % (ref 3–12)

## 2011-01-06 LAB — URINALYSIS, ROUTINE W REFLEX MICROSCOPIC
Bilirubin Urine: NEGATIVE
Hgb urine dipstick: NEGATIVE
Nitrite: NEGATIVE
Protein, ur: NEGATIVE mg/dL
Specific Gravity, Urine: 1.02 (ref 1.005–1.030)
Urobilinogen, UA: 0.2 mg/dL (ref 0.0–1.0)

## 2011-01-06 LAB — POCT PREGNANCY, URINE: Preg Test, Ur: POSITIVE

## 2011-01-06 LAB — CBC
HCT: 34.7 % — ABNORMAL LOW (ref 36.0–46.0)
HCT: 25.2 % — ABNORMAL LOW (ref 36.0–46.0)
Hemoglobin: 11.4 g/dL — ABNORMAL LOW (ref 12.0–15.0)
Hemoglobin: 8.6 g/dL — ABNORMAL LOW (ref 12.0–15.0)
MCH: 30.6 pg (ref 26.0–34.0)
MCHC: 32.9 g/dL (ref 30.0–36.0)
MCHC: 34.1 g/dL (ref 30.0–36.0)
MCV: 93.3 fL (ref 78.0–100.0)
RBC: 2.68 MIL/uL — ABNORMAL LOW (ref 3.87–5.11)
RDW: 12.8 % (ref 11.5–15.5)

## 2011-01-06 LAB — APTT: aPTT: 28 seconds (ref 24–37)

## 2011-01-06 LAB — ABO/RH: ABO/RH(D): O POS

## 2011-01-06 LAB — WET PREP, GENITAL: Clue Cells Wet Prep HPF POC: NONE SEEN

## 2011-01-06 LAB — PROTIME-INR
INR: 1.17 (ref 0.00–1.49)
Prothrombin Time: 15.1 seconds (ref 11.6–15.2)

## 2011-01-06 LAB — LIPASE, BLOOD: Lipase: 16 U/L (ref 11–59)

## 2011-01-06 SURGERY — LAPAROSCOPY OPERATIVE
Anesthesia: General | Site: Abdomen | Laterality: Right | Wound class: Clean Contaminated

## 2011-01-06 MED ORDER — SUCCINYLCHOLINE CHLORIDE 20 MG/ML IJ SOLN
INTRAMUSCULAR | Status: DC | PRN
  Administered 2011-01-06: 100 mg via INTRAVENOUS

## 2011-01-06 MED ORDER — LIDOCAINE HCL (CARDIAC) 20 MG/ML IV SOLN
INTRAVENOUS | Status: DC | PRN
  Administered 2011-01-06: 50 mg via INTRAVENOUS

## 2011-01-06 MED ORDER — PROPOFOL 10 MG/ML IV EMUL
INTRAVENOUS | Status: DC | PRN
  Administered 2011-01-06: 150 mg via INTRAVENOUS

## 2011-01-06 MED ORDER — MIDAZOLAM HCL 5 MG/5ML IJ SOLN
INTRAMUSCULAR | Status: DC | PRN
  Administered 2011-01-06: 2 mg via INTRAVENOUS

## 2011-01-06 MED ORDER — FAMOTIDINE IN NACL 20-0.9 MG/50ML-% IV SOLN
INTRAVENOUS | Status: AC
  Administered 2011-01-06: 20 mg via INTRAVENOUS
  Filled 2011-01-06: qty 50

## 2011-01-06 MED ORDER — PHENYLEPHRINE HCL 10 MG/ML IJ SOLN
INTRAMUSCULAR | Status: DC | PRN
  Administered 2011-01-06: 80 ug via INTRAVENOUS
  Administered 2011-01-06: 40 ug via INTRAVENOUS
  Administered 2011-01-06: 80 ug via INTRAVENOUS

## 2011-01-06 MED ORDER — LACTATED RINGERS IV SOLN
INTRAVENOUS | Status: DC | PRN
  Administered 2011-01-06: via INTRAVENOUS

## 2011-01-06 MED ORDER — CITRIC ACID-SODIUM CITRATE 334-500 MG/5ML PO SOLN
30.0000 mL | Freq: Once | ORAL | Status: AC
  Administered 2011-01-06: 30 mL via ORAL

## 2011-01-06 MED ORDER — FAMOTIDINE IN NACL 20-0.9 MG/50ML-% IV SOLN
20.0000 mg | Freq: Once | INTRAVENOUS | Status: AC
  Administered 2011-01-06: 20 mg via INTRAVENOUS

## 2011-01-06 MED ORDER — ROCURONIUM BROMIDE 100 MG/10ML IV SOLN
INTRAVENOUS | Status: DC | PRN
  Administered 2011-01-06: 30 mg via INTRAVENOUS
  Administered 2011-01-07: 20 mg via INTRAVENOUS

## 2011-01-06 MED ORDER — SODIUM CHLORIDE 0.9 % IV SOLN
INTRAVENOUS | Status: DC
  Administered 2011-01-06: 23:00:00 via INTRAVENOUS

## 2011-01-06 MED ORDER — FENTANYL CITRATE 0.05 MG/ML IJ SOLN
INTRAMUSCULAR | Status: DC | PRN
  Administered 2011-01-06: 100 ug via INTRAVENOUS
  Administered 2011-01-06: 50 ug via INTRAVENOUS
  Administered 2011-01-06 – 2011-01-07 (×2): 100 ug via INTRAVENOUS

## 2011-01-06 MED ORDER — CITRIC ACID-SODIUM CITRATE 334-500 MG/5ML PO SOLN
ORAL | Status: AC
  Filled 2011-01-06: qty 15

## 2011-01-06 SURGICAL SUPPLY — 44 items
ADH SKN CLS APL DERMABOND .7 (GAUZE/BANDAGES/DRESSINGS) ×4
BAG SPEC RTRVL LRG 6X4 10 (ENDOMECHANICALS)
BRR ADH 6X5 SEPRAFILM 1 SHT (MISCELLANEOUS) ×2
CABLE HIGH FREQUENCY MONO STRZ (ELECTRODE) IMPLANT
CANISTER SUCTION 2500CC (MISCELLANEOUS) ×2 IMPLANT
CATH ROBINSON RED A/P 16FR (CATHETERS) ×2 IMPLANT
CHLORAPREP W/TINT 26ML (MISCELLANEOUS) ×3 IMPLANT
CLOTH BEACON ORANGE TIMEOUT ST (SAFETY) ×3 IMPLANT
CONT SPECI 4OZ STER CLIK (MISCELLANEOUS) IMPLANT
COVER MAYO STAND STRL (DRAPES) ×1 IMPLANT
DERMABOND ADVANCED (GAUZE/BANDAGES/DRESSINGS) ×2
DERMABOND ADVANCED .7 DNX12 (GAUZE/BANDAGES/DRESSINGS) IMPLANT
DRAPE UTILITY XL STRL (DRAPES) ×3 IMPLANT
DRSG COVADERM 4X8 (GAUZE/BANDAGES/DRESSINGS) ×1 IMPLANT
ELECT BLADE 6 FLAT ULTRCLN (ELECTRODE) ×1 IMPLANT
ELECT REM PT RETURN 9FT ADLT (ELECTROSURGICAL) ×3
ELECTRODE REM PT RTRN 9FT ADLT (ELECTROSURGICAL) IMPLANT
GLOVE BIO SURGEON STRL SZ 6.5 (GLOVE) ×6 IMPLANT
GLOVE SKINSENSE NS SZ7.5 (GLOVE) ×1
GLOVE SKINSENSE NS SZ8.0 LF (GLOVE) ×1
GLOVE SKINSENSE STRL SZ7.5 (GLOVE) IMPLANT
GLOVE SKINSENSE STRL SZ8.0 LF (GLOVE) IMPLANT
GOWN PREVENTION PLUS LG XLONG (DISPOSABLE) ×6 IMPLANT
NS IRRIG 1000ML POUR BTL (IV SOLUTION) ×5 IMPLANT
PACK LAPAROSCOPY BASIN (CUSTOM PROCEDURE TRAY) ×3 IMPLANT
PENCIL BUTTON HOLSTER BLD 10FT (ELECTRODE) ×1 IMPLANT
POUCH SPECIMEN RETRIEVAL 10MM (ENDOMECHANICALS) IMPLANT
SEALER TISSUE G2 CVD JAW 35 (ENDOMECHANICALS) IMPLANT
SEALER TISSUE G2 CVD JAW 45CM (ENDOMECHANICALS) IMPLANT
SEPRAFILM MEMBRANE 5X6 (MISCELLANEOUS) ×1 IMPLANT
SET IRRIG TUBING LAPAROSCOPIC (IRRIGATION / IRRIGATOR) ×1 IMPLANT
SPONGE LAP 18X18 X RAY DECT (DISPOSABLE) ×3 IMPLANT
STAPLER VISISTAT 35W (STAPLE) ×1 IMPLANT
SUT MNCRL AB 4-0 PS2 18 (SUTURE) ×2 IMPLANT
SUT VIC AB 0 CTB1 27 (SUTURE) ×2 IMPLANT
SUT VICRYL 0 UR6 27IN ABS (SUTURE) ×4 IMPLANT
SUT VICRYL 4-0 PS2 18IN ABS (SUTURE) IMPLANT
TOWEL OR 17X24 6PK STRL BLUE (TOWEL DISPOSABLE) ×6 IMPLANT
TRAY FOLEY CATH 14FR (SET/KITS/TRAYS/PACK) ×1 IMPLANT
TROCAR XCEL NON-BLD 11X100MML (ENDOMECHANICALS) ×4 IMPLANT
TROCAR XCEL NON-BLD 5MMX100MML (ENDOMECHANICALS) ×6 IMPLANT
TUBING NON-CON 1/4 X 20 CONN (TUBING) ×1 IMPLANT
WATER STERILE IRR 1000ML POUR (IV SOLUTION) ×2 IMPLANT
YANKAUER SUCT BULB TIP NO VENT (SUCTIONS) ×1 IMPLANT

## 2011-01-06 NOTE — Progress Notes (Signed)
ARRIVED VIA CARELINK

## 2011-01-06 NOTE — H&P (Addendum)
[name] is an 25 y.o. female. She presents with abdominal pain.  HPI:  She presented to the [facility] with abdominal pain.  Minimal concomitant symptoms.  Found to be pregnant with a quantitative bHCG of approximately 2600.  A pelvic U/S showed a possible pseudosac.  No ectopic pregnancy or free peritoneal fluid.  The patient has had several hypotensive episodes.  Here Hgb/Hct is trending down.  There is a leukocytosis.    Menstrual History:  ?LMP   PMH: HIV pos  PSH: She denies  FH: Non-contributory  Social History:  Current tobacco, ethanol and cannabis abuse  Allergies: No Known Allergies  Prescriptions prior to admission  Medication Sig Dispense Refill  . atazanavir (REYATAZ) 150 MG capsule Take 2 capsules (300 mg total) by mouth daily with breakfast.  60 capsule  11  . emtricitabine-tenofovir (TRUVADA) 200-300 MG per tablet Take 1 tablet by mouth daily.  30 tablet  11  . ritonavir (NORVIR) 100 MG TABS Take 1 tablet (100 mg total) by mouth daily with breakfast.  30 tablet  11    Review of Systems  Gastrointestinal: Positive for abdominal pain. Negative for nausea and vomiting.  Neurological: Positive for weakness.  Psychiatric/Behavioral: Positive for substance abuse.    Blood pressure 91/49, pulse 105, temperature 97 F (36.1 C), resp. rate 22, SpO2 99.00%. Physical Exam  Constitutional: She appears well-developed and well-nourished. She appears distressed.  GI: Soft. She exhibits distension. There is tenderness. There is rebound and guarding.  Neurological: She is alert.    Results for orders placed during the hospital encounter of 01/06/11 (from the past 24 hour(s))  DIFFERENTIAL     Status: Normal   Collection Time   01/06/11  5:30 PM      Component Value Range   Neutrophils Relative 69  43 - 77 (%)   Neutro Abs 5.3  1.7 - 7.7 (K/uL)   Lymphocytes Relative 25  12 - 46 (%)   Lymphs Abs 2.0  0.7 - 4.0 (K/uL)   Monocytes Relative 4  3 - 12 (%)   Monocytes  Absolute 0.3  0.1 - 1.0 (K/uL)   Eosinophils Relative 2  0 - 5 (%)   Eosinophils Absolute 0.1  0.0 - 0.7 (K/uL)   Basophils Relative 0  0 - 1 (%)   Basophils Absolute 0.0  0.0 - 0.1 (K/uL)  CBC     Status: Abnormal   Collection Time   01/06/11  5:30 PM      Component Value Range   WBC 7.8  4.0 - 10.5 (K/uL)   RBC 3.72 (*) 3.87 - 5.11 (MIL/uL)   Hemoglobin 11.4 (*) 12.0 - 15.0 (g/dL)   HCT 96.0 (*) 45.4 - 46.0 (%)   MCV 93.3  78.0 - 100.0 (fL)   MCH 30.6  26.0 - 34.0 (pg)   MCHC 32.9  30.0 - 36.0 (g/dL)   RDW 09.8  11.9 - 14.7 (%)   Platelets 263  150 - 400 (K/uL)  COMPREHENSIVE METABOLIC PANEL     Status: Abnormal   Collection Time   01/06/11  5:30 PM      Component Value Range   Sodium 134 (*) 135 - 145 (mEq/L)   Potassium 3.7  3.5 - 5.1 (mEq/L)   Chloride 102  96 - 112 (mEq/L)   CO2 23  19 - 32 (mEq/L)   Glucose, Bld 151 (*) 70 - 99 (mg/dL)   BUN 10  6 - 23 (mg/dL)   Creatinine, Ser  0.63  0.50 - 1.10 (mg/dL)   Calcium 9.0  8.4 - 96.0 (mg/dL)   Total Protein 7.4  6.0 - 8.3 (g/dL)   Albumin 3.6  3.5 - 5.2 (g/dL)   AST 16  0 - 37 (U/L)   ALT 11  0 - 35 (U/L)   Alkaline Phosphatase 31 (*) 39 - 117 (U/L)   Total Bilirubin 2.2 (*) 0.3 - 1.2 (mg/dL)   GFR calc non Af Amer >90  >90 (mL/min)   GFR calc Af Amer >90  >90 (mL/min)  LIPASE, BLOOD     Status: Normal   Collection Time   01/06/11  5:30 PM      Component Value Range   Lipase 16  11 - 59 (U/L)  POCT PREGNANCY, URINE     Status: Normal   Collection Time   01/06/11  6:26 PM      Component Value Range   Preg Test, Ur POSITIVE    URINALYSIS, ROUTINE W REFLEX MICROSCOPIC     Status: Abnormal   Collection Time   01/06/11  6:27 PM      Component Value Range   Color, Urine YELLOW  YELLOW    Appearance CLOUDY (*) CLEAR    Specific Gravity, Urine 1.020  1.005 - 1.030    pH 6.0  5.0 - 8.0    Glucose, UA NEGATIVE  NEGATIVE (mg/dL)   Hgb urine dipstick NEGATIVE  NEGATIVE    Bilirubin Urine NEGATIVE  NEGATIVE    Ketones,  ur TRACE (*) NEGATIVE (mg/dL)   Protein, ur NEGATIVE  NEGATIVE (mg/dL)   Urobilinogen, UA 0.2  0.0 - 1.0 (mg/dL)   Nitrite NEGATIVE  NEGATIVE    Leukocytes, UA NEGATIVE  NEGATIVE   ABO/RH     Status: Normal   Collection Time   01/06/11  7:00 PM      Component Value Range   ABO/RH(D) O POS    WET PREP, GENITAL     Status: Abnormal   Collection Time   01/06/11  7:04 PM      Component Value Range   Yeast, Wet Prep NONE SEEN  NONE SEEN    Trich, Wet Prep NONE SEEN  NONE SEEN    Clue Cells, Wet Prep NONE SEEN  NONE SEEN    WBC, Wet Prep HPF POC FEW (*) NONE SEEN   HCG, QUANTITATIVE, PREGNANCY     Status: Abnormal   Collection Time   01/06/11  8:35 PM      Component Value Range   hCG, Beta Chain, Quant, S 2683 (*) <5 (mIU/mL)  CBC     Status: Abnormal   Collection Time   01/06/11  9:23 PM      Component Value Range   WBC 17.6 (*) 4.0 - 10.5 (K/uL)   RBC 2.68 (*) 3.87 - 5.11 (MIL/uL)   Hemoglobin 8.6 (*) 12.0 - 15.0 (g/dL)   HCT 45.4 (*) 09.8 - 46.0 (%)   MCV 94.0  78.0 - 100.0 (fL)   MCH 32.1  26.0 - 34.0 (pg)   MCHC 34.1  30.0 - 36.0 (g/dL)   RDW 11.9  14.7 - 82.9 (%)   Platelets 224  150 - 400 (K/uL)    US Ob Comp Less 14 Wks  01/06/2011  *RADIOLOGY REPORT*  Clinical Data: pain; ; POSITIVE PREGNANCY TEST.  OBSTETRIC <14 WK [place] AND TRANSVAGINAL OB US  Technique: Both transabdominal and transvaginal ultrasound examinations were performed for complete evaluation of the gestation as well as  the maternal uterus, adnexal regions, and pelvic cul-de-sac.  Comparison: None.  Findings: No definite intrauterine gestational sac.  There is a tiny cystic area noted centrally within the uterus on one image. This could represent a very early gestational sac although this is too small to characterize at this time.  Uterus is unremarkable.  The ovaries are difficult to visualize.  The patient was extremely tender during examination, limiting the examination.  Right ovary appears to measure 4.3 x 2.4  x 2.2 cm.  Left ovary likely measures 3.3 x 2.8 cm. Not well visualized in the third planes.  Small follicles seen within the left ovary.  No visible findings to suggest ectopic pregnancy at this time.  No free fluid seen.  IMPRESSION: Technically limited study due to the patient's pain.  No definite intrauterine gestation (although tiny central cystic area could represent a very early gestational sac).  No visible changes to suggest ectopic pregnancy.  Recommend close clinical follow-up and serial quantitative beta HCGs and ultrasounds.  Original Report Authenticated By: Cyndie Chime, M.D.   US Ob Transvaginal  01/06/2011  *RADIOLOGY REPORT*  Clinical Data: pain; ; POSITIVE PREGNANCY TEST.  OBSTETRIC <14 WK [place] AND TRANSVAGINAL OB US  Technique: Both transabdominal and transvaginal ultrasound examinations were performed for complete evaluation of the gestation as well as the maternal uterus, adnexal regions, and pelvic cul-de-sac.  Comparison: None.  Findings: No definite intrauterine gestational sac.  There is a tiny cystic area noted centrally within the uterus on one image. This could represent a very early gestational sac although this is too small to characterize at this time.  Uterus is unremarkable.  The ovaries are difficult to visualize.  The patient was extremely tender during examination, limiting the examination.  Right ovary appears to measure 4.3 x 2.4 x 2.2 cm.  Left ovary likely measures 3.3 x 2.8 cm. Not well visualized in the third planes.  Small follicles seen within the left ovary.  No visible findings to suggest ectopic pregnancy at this time.  No free fluid seen.  IMPRESSION: Technically limited study due to the patient's pain.  No definite intrauterine gestation (although tiny central cystic area could represent a very early gestational sac).  No visible changes to suggest ectopic pregnancy.  Recommend close clinical follow-up and serial quantitative beta HCGs and ultrasounds.  Original  Report Authenticated By: Cyndie Chime, M.D.    Assessment/Plan: Early pregnant.  Acute abdomen.  Rule out an intraperitoneal bleed.  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

## 2011-01-06 NOTE — Anesthesia Procedure Notes (Addendum)
Procedure Name: Intubation Date/Time: 01/06/2011 11:40 PM Performed by: Madison Hickman Pre-anesthesia Checklist: Patient identified, Patient being monitored, Emergency Drugs available, Timeout performed and Suction available Patient Re-evaluated:Patient Re-evaluated prior to inductionOxygen Delivery Method: Circle System Utilized Preoxygenation: Pre-oxygenation with 100% oxygen Intubation Type: IV induction Laryngoscope Size: Mac and 3 Grade View: Grade II Tube type: Oral Tube size: 7.0 mm Number of attempts: 1 Airway Equipment and Method: stylet Placement Confirmation: ETT inserted through vocal cords under direct vision,  breath sounds checked- equal and bilateral and positive ETCO2 Secured at: 22 cm Tube secured with: Tape Dental Injury: Teeth and Oropharynx as per pre-operative assessment

## 2011-01-06 NOTE — Progress Notes (Signed)
TO OR VIA STRETCHER.

## 2011-01-06 NOTE — Anesthesia Preprocedure Evaluation (Signed)
Anesthesia Evaluation  Name, MR# and DOB Patient awake  General Assessment Comment  Reviewed: Allergy & Precautions, H&P , NPO status , Patient's Chart, lab work & pertinent test results  Airway Mallampati: III TM Distance: >3 FB Neck ROM: full    Dental  (+) Missing, Poor Dentition and Dental Advidsory Given   Pulmonary    Pulmonary exam normal       Cardiovascular regular Normal    Neuro/Psych Negative Neurological ROS  Negative Psych ROS   GI/Hepatic negative GI ROS Neg liver ROS    Endo/Other  Negative Endocrine ROS  Renal/GU negative Renal ROS  Genitourinary negative   Musculoskeletal   Abdominal   Peds  Hematology negative hematology ROS (+)   Anesthesia Other Findings   Reproductive/Obstetrics (+) Pregnancy                           Anesthesia Physical Anesthesia Plan  ASA: II and Emergent  Anesthesia Plan: General   Post-op Pain Management:    Induction:   Airway Management Planned:   Additional Equipment:   Intra-op Plan:   Post-operative Plan:   Informed Consent: I have reviewed the patients History and Physical, chart, labs and discussed the procedure including the risks, benefits and alternatives for the proposed anesthesia with the patient or authorized representative who has indicated his/her understanding and acceptance.   Dental Advisory Given  Plan Discussed with: Anesthesiologist, CRNA and Surgeon  Anesthesia Plan Comments:         Anesthesia Quick Evaluation

## 2011-01-07 ENCOUNTER — Encounter (HOSPITAL_COMMUNITY): Payer: Self-pay | Admitting: *Deleted

## 2011-01-07 DIAGNOSIS — O009 Unspecified ectopic pregnancy without intrauterine pregnancy: Secondary | ICD-10-CM | POA: Diagnosis present

## 2011-01-07 LAB — CBC
HCT: 19.4 % — ABNORMAL LOW (ref 36.0–46.0)
MCV: 91.9 fL (ref 78.0–100.0)
Platelets: 147 10*3/uL — ABNORMAL LOW (ref 150–400)
RBC: 2.11 MIL/uL — ABNORMAL LOW (ref 3.87–5.11)
RDW: 13.6 % (ref 11.5–15.5)
WBC: 8.6 10*3/uL (ref 4.0–10.5)

## 2011-01-07 LAB — MRSA PCR SCREENING: MRSA by PCR: NEGATIVE

## 2011-01-07 LAB — GC/CHLAMYDIA PROBE AMP, GENITAL: Chlamydia, DNA Probe: NEGATIVE

## 2011-01-07 LAB — PREPARE RBC (CROSSMATCH)

## 2011-01-07 LAB — HEMOGLOBIN AND HEMATOCRIT, BLOOD: HCT: 17.9 % — ABNORMAL LOW (ref 36.0–46.0)

## 2011-01-07 LAB — URINE CULTURE
Colony Count: NO GROWTH
Culture  Setup Time: 201210170057

## 2011-01-07 LAB — HCG, QUANTITATIVE, PREGNANCY: hCG, Beta Chain, Quant, S: 833 m[IU]/mL — ABNORMAL HIGH

## 2011-01-07 MED ORDER — DIPHENHYDRAMINE HCL 12.5 MG/5ML PO ELIX
12.5000 mg | ORAL_SOLUTION | Freq: Four times a day (QID) | ORAL | Status: DC | PRN
  Filled 2011-01-07: qty 5

## 2011-01-07 MED ORDER — ONDANSETRON HCL 4 MG/2ML IJ SOLN: 4.0000 mg | Freq: Four times a day (QID) | INTRAMUSCULAR | Status: DC | PRN

## 2011-01-07 MED ORDER — ONDANSETRON HCL 4 MG PO TABS
4.0000 mg | ORAL_TABLET | Freq: Four times a day (QID) | ORAL | Status: DC | PRN
  Administered 2011-01-08 – 2011-01-09 (×2): 4 mg via ORAL
  Filled 2011-01-07 (×2): qty 1

## 2011-01-07 MED ORDER — ZOLPIDEM TARTRATE 10 MG PO TABS
10.0000 mg | ORAL_TABLET | Freq: Every day | ORAL | Status: DC
  Filled 2011-01-07: qty 1

## 2011-01-07 MED ORDER — OXYCODONE-ACETAMINOPHEN 5-325 MG PO TABS
1.0000 | ORAL_TABLET | ORAL | Status: DC | PRN
  Administered 2011-01-07 – 2011-01-08 (×2): 2 via ORAL
  Administered 2011-01-08 (×2): 1 via ORAL
  Administered 2011-01-08: 2 via ORAL
  Administered 2011-01-08 – 2011-01-09 (×7): 1 via ORAL
  Filled 2011-01-07: qty 2
  Filled 2011-01-07 (×3): qty 1
  Filled 2011-01-07 (×2): qty 2
  Filled 2011-01-07 (×2): qty 1
  Filled 2011-01-07 (×2): qty 2
  Filled 2011-01-07 (×2): qty 1

## 2011-01-07 MED ORDER — MEPERIDINE HCL 25 MG/ML IJ SOLN: 6.2500 mg | INTRAMUSCULAR | Status: DC | PRN

## 2011-01-07 MED ORDER — BUPIVACAINE HCL (PF) 0.25 % IJ SOLN
INTRAMUSCULAR | Status: DC | PRN
  Administered 2011-01-06: 5 mL

## 2011-01-07 MED ORDER — DIPHENHYDRAMINE HCL 50 MG/ML IJ SOLN
12.5000 mg | Freq: Four times a day (QID) | INTRAMUSCULAR | Status: DC | PRN
  Administered 2011-01-07: 25 mg via INTRAVENOUS

## 2011-01-07 MED ORDER — METOCLOPRAMIDE HCL 5 MG/ML IJ SOLN
10.0000 mg | Freq: Once | INTRAMUSCULAR | Status: DC | PRN
  Administered 2011-01-07: 10 mg via INTRAVENOUS

## 2011-01-07 MED ORDER — DEXTROSE-NACL 5-0.2 % IV SOLN
INTRAVENOUS | Status: DC
  Administered 2011-01-07: 04:00:00 via INTRAVENOUS

## 2011-01-07 MED ORDER — CEFAZOLIN SODIUM 1-5 GM-% IV SOLN
INTRAVENOUS | Status: AC
  Filled 2011-01-07: qty 100

## 2011-01-07 MED ORDER — PNEUMOCOCCAL VAC POLYVALENT 25 MCG/0.5ML IJ INJ
0.5000 mL | INJECTION | INTRAMUSCULAR | Status: DC
  Filled 2011-01-07: qty 0.5

## 2011-01-07 MED ORDER — NEOSTIGMINE METHYLSULFATE 1 MG/ML IJ SOLN
INTRAMUSCULAR | Status: AC
  Filled 2011-01-07: qty 10

## 2011-01-07 MED ORDER — DIPHENHYDRAMINE HCL 50 MG/ML IJ SOLN
12.5000 mg | Freq: Four times a day (QID) | INTRAMUSCULAR | Status: DC | PRN
  Administered 2011-01-07: 12.5 mg via INTRAVENOUS
  Filled 2011-01-07: qty 1

## 2011-01-07 MED ORDER — NALBUPHINE HCL 10 MG/ML IJ SOLN
10.0000 mg | Freq: Four times a day (QID) | INTRAMUSCULAR | Status: DC | PRN
  Administered 2011-01-07: 10 mg via INTRAVENOUS
  Filled 2011-01-07 (×2): qty 1

## 2011-01-07 MED ORDER — METOCLOPRAMIDE HCL 5 MG/ML IJ SOLN
INTRAMUSCULAR | Status: AC
  Administered 2011-01-07: 10 mg via INTRAVENOUS
  Filled 2011-01-07: qty 2

## 2011-01-07 MED ORDER — INFLUENZA VIRUS VACC SPLIT PF IM SUSP
0.5000 mL | Freq: Once | INTRAMUSCULAR | Status: AC
  Administered 2011-01-08: 0.5 mL via INTRAMUSCULAR
  Filled 2011-01-07: qty 0.5

## 2011-01-07 MED ORDER — HYDROMORPHONE 0.3 MG/ML IV SOLN
INTRAVENOUS | Status: AC
  Filled 2011-01-07: qty 25

## 2011-01-07 MED ORDER — SODIUM CHLORIDE 0.9 % IJ SOLN
INTRAMUSCULAR | Status: AC
  Administered 2011-01-07: 9 mL via INTRAVENOUS
  Filled 2011-01-07: qty 3

## 2011-01-07 MED ORDER — SODIUM CHLORIDE 0.9 % IJ SOLN: 9.0000 mL | INTRAMUSCULAR | Status: DC | PRN

## 2011-01-07 MED ORDER — NALOXONE HCL 0.4 MG/ML IJ SOLN: 0.4000 mg | INTRAMUSCULAR | Status: DC | PRN

## 2011-01-07 MED ORDER — DEXTROSE-NACL 5-0.45 % IV SOLN
INTRAVENOUS | Status: DC
  Administered 2011-01-07 – 2011-01-08 (×4): via INTRAVENOUS

## 2011-01-07 MED ORDER — FENTANYL 10 MCG/ML IV SOLN
INTRAVENOUS | Status: DC
  Administered 2011-01-07: 450 ug via INTRAVENOUS
  Administered 2011-01-07: 11:00:00 via INTRAVENOUS
  Filled 2011-01-07: qty 50

## 2011-01-07 MED ORDER — ONDANSETRON HCL 4 MG/2ML IJ SOLN
4.0000 mg | Freq: Four times a day (QID) | INTRAMUSCULAR | Status: DC | PRN
  Administered 2011-01-07: 4 mg via INTRAVENOUS
  Filled 2011-01-07: qty 2

## 2011-01-07 MED ORDER — SODIUM CHLORIDE 0.9 % IJ SOLN
9.0000 mL | INTRAMUSCULAR | Status: DC | PRN
  Administered 2011-01-07: 9 mL via INTRAVENOUS

## 2011-01-07 MED ORDER — HYDROMORPHONE 0.3 MG/ML IV SOLN
INTRAVENOUS | Status: DC
  Administered 2011-01-07: 1.8 mg via INTRAVENOUS
  Administered 2011-01-07: 06:00:00 via INTRAVENOUS

## 2011-01-07 MED ORDER — PHENOL 1.4 % MT LIQD
1.0000 | OROMUCOSAL | Status: DC | PRN
  Filled 2011-01-07: qty 177

## 2011-01-07 MED ORDER — GLYCOPYRROLATE 0.2 MG/ML IJ SOLN
INTRAMUSCULAR | Status: AC
  Filled 2011-01-07: qty 1

## 2011-01-07 MED ORDER — MORPHINE SULFATE 2 MG/ML IJ SOLN: 1.0000 mg | INTRAMUSCULAR | Status: DC | PRN

## 2011-01-07 MED ORDER — SIMETHICONE 80 MG PO CHEW: 80.0000 mg | CHEWABLE_TABLET | Freq: Four times a day (QID) | ORAL | Status: DC | PRN

## 2011-01-07 MED ORDER — FENTANYL CITRATE 0.05 MG/ML IJ SOLN
INTRAMUSCULAR | Status: AC
  Filled 2011-01-07: qty 2

## 2011-01-07 MED ORDER — SODIUM CHLORIDE 0.9 % IR SOLN
Status: DC | PRN
  Administered 2011-01-07: 3000 mL

## 2011-01-07 MED ORDER — LACTATED RINGERS IR SOLN
Status: DC | PRN
  Administered 2011-01-06: 3000 mL

## 2011-01-07 MED ORDER — HYDROMORPHONE HCL 1 MG/ML IJ SOLN
INTRAMUSCULAR | Status: DC | PRN
  Administered 2011-01-07: 1 mg via INTRAVENOUS

## 2011-01-07 NOTE — Progress Notes (Signed)
25 y/o pt rec'd from PACU following diagnostic lap/exp lap/rt salpingectomy - ruptured ectopic pregnancy..  Transferred from stretcher to bed with encouragement.  Pt sedated, roused and responded appropriately to commands.    IVF - D51/2 NS hung on admission - inf at 117ml/hr per orders through #18G left hand.  SL #20G RT AC.  Telemetry  monioer applied, VS obtained - monitor applied.  Foley patent with cl yellow urine noted, to BSD.  SR up x 2.  Pt oriented to room, plan of care discussed.  Pt sleeping at intervals during admission assessment.   Abd incision assessed with old drainage marked.  SCD's on and compressing BLE.   CHG bath completed, nasal MRSA screen obtained.  EPIC system down on admission.

## 2011-01-07 NOTE — OR Nursing (Signed)
Converted from Diagnostic Laparoscopy to Exploratory Laparotomy with Right Salpingectomy at 0025.   Sepra Film placed by Dr. Tamela Oddi @ 1610,  Lot # [id number], exp. 11/21/2012

## 2011-01-07 NOTE — Progress Notes (Signed)
UR chart review completed.  

## 2011-01-07 NOTE — Op Note (Signed)
[name], [name]              ACCOUNT NO.:  [id number]  MEDICAL RECORD NO.:  [id number]  LOCATION:  [facility]                          FACILITY:  [facility]  PHYSICIAN:  Roseanna Rainbow, M.D.DATE OF BIRTH:  03/04/1986  DATE OF PROCEDURE:  01/07/2011 DATE OF DISCHARGE:                              OPERATIVE REPORT   PREOPERATIVE DIAGNOSES: 1. Early pregnancy. 2. Acute abdomen. 3. Hemoperitoneum.  POSTOPERATIVE DIAGNOSIS:  Ruptured interstitial pregnancy, right sided.  PROCEDURES: 1. Diagnostic laparoscopy. 2. Exploratory laparotomy. 3. Right salpingectomy. 4. Right corneal wedge resection.  SURGEON:  Roseanna Rainbow, MD.  ANESTHESIA:  General endotracheal.  FINDINGS:  There was a rupture of the interstitial portion of the right fallopian tube.  There was a large hemoperitoneum, otherwise the pelvic anatomy was normal in appearance.  SPECIMENS:  Right fallopian tube.  The specimen was sent to Pathology.  ESTIMATED BLOOD LOSS:  Minimal plus approximately 1 L of hemoperitoneum.  COMPLICATIONS:  None.  PROCEDURE IN DETAIL:  Informed consent was obtained in the Maternity Admissions Unit.  The patient was brought to the OR with an IV running. She was placed in the semi lithotomy position in [place] then given general anesthesia.  She was prepped and draped in the usual sterile fashion.  After a time-out had been performed, the infraumbilical skin was then infiltrated with 0.25% Marcaine.  An incision was then made.  A 10-mm trocar and sleeve were then advanced into the abdomen using the Optiview under direct visualization.  The abdomen was then insufflated with CO2 gas.  A 10-mm suprapubic port and a 5-mm left lower quadrant port were then placed under direct visualization.  At the time of diagnostic laparoscopy, the tubes and ovaries grossly appeared normal.  There was a small bleeding point noted from the right cornu.  At this point, the decision was made to  proceed with an exploratory laparotomy to find the source of the acute hemorrhage.  A midline suprapubic vertical incision was then made with the scalpel.  This was extended down to the fascia with the Bovie.  The fascia was incised along the length of the incision.  The parietal peritoneum was tented up and entered sharply.  This incision was then extended superiorly and inferiorly.  An Lenox Ahr retractor was then placed into the incision and the bowel packed away with moist laparotomy packs.  The pelvis was suctioned.  The ovaries and fallopian tubes were palpated.  The above-noted bleeding point in the right cornu was noted to be a rent.  The rent was probed and was contiguous with the right fallopian tube.  There was a small amount of bleeding from this point.  At this point, the decision was made to proceed with right salpingectomy.  The mesosalpinx was then clamped in segment.  The pedicles were suture ligated with 2-0 Monocryl suture ligatures.  The above-noted rent was then wedged by clamping, wedge resection with a Kelly clamp.  This area was excised.  The area was oversewn with a figure-of-eight suture of 2-0 Monocryl.  This layer was then imbricated with a running interlocking suture of 2-0 Monocryl.  Adequate hemostasis was noted.  The pelvis was copiously irrigated.  There was no active bleeding noted at this point.  An attempt was made to evacuate as much as possible the blood and clots from the upper abdomen as well.  The abdomen was explored.  Seprafilm was placed over the cornual closure. The retractor and the packs were then removed from the abdomen.  The fascia and parietal peritoneum were closed in a single layer using two running sutures of 0 Vicryl.  The midline skin incision was then closed with staples.  For the infraumbilical incision, the subcutaneous layer was reapproximated with a figure-of-eight suture of 0 Vicryl on a UR-6 needle.  The skin  incision for this infraumbilical incision as well as the left lower quadrant port were reapproximated with the skin adhesive. At the close of the procedure, the instrument and pack counts were said to be correct x2.  The patient was awakened from general anesthesia and taken to the PACU awake and in stable condition.     Roseanna Rainbow, M.D.     Judee Clara  D:  01/07/2011  T:  01/07/2011  Job:  [id number]

## 2011-01-07 NOTE — Progress Notes (Signed)
  1 Day Post-Op Procedure(s) (LRB): LAPAROSCOPY OPERATIVE (N/A) LAPAROTOMY (N/A) UNILATERAL SALPINGECTOMY (Right)  Subjective: Patient reports no c/o.   Objective: Vital signs in last 24 hours: Temp:  [97 F (36.1 C)-98.7 F (37.1 C)] 98.3 F (36.8 C) (10/17 1143) Pulse Rate:  [105-130] 112  (10/17 1200) Resp:  [15-36] 22  (10/17 1300) BP: (89-153)/(49-96) 138/89 mmHg (10/17 1200) SpO2:  [97 %-100 %] 100 % (10/17 1200) Weight:  [78.245 kg (172 lb 8 oz)] 172 lb 8 oz (78.245 kg) (10/17 0400)    Intake/Output from previous day: 10/16 0701 - 10/17 0700 In: 1300 [I.V.:1300] Out: 850 [Urine:850] Intake/Output this shift: Total I/O In: 347 [Blood:345; IV Piggyback:2] Out: 640 [Urine:600; Emesis/NG output:40]  Physical Examination:  General: alert GI: soft, non-tender; bowel sounds normal; no masses,  no organomegaly and incision: clean, dry and intact Extremities: extremities normal, atraumatic, no cyanosis or edema   Labs:  WBC/Hgb/Hct/Plts:  --/5.9/17.9/-- (10/17 1610) BUN/Cr/glu/ALT/AST/amyl/lip:  10/0.63/--/11/16/--/16 (10/16 1730)  Assessment:  24 y.o. s/p Procedure(s): LAPAROSCOPY OPERATIVE LAPAROTOMY UNILATERAL SALPINGECTOMY: stable and anemia  Pain: Pain is well-controlled on PCA.  ?pruritus w/dilaudid PCA  Heme:Anemia: tachycardic PLAN: Transfuse 1 unit PRBC   GI:  Tolerating po: No.  The pt's N/V is controlled with : zofran intravenous.    Prophylaxis: intermittent pneumatic compression boots.  Plan: Clear liquids Check post transfusion CBC  LOS: 1 day     JACKSON-MOORE,Wren Gallaga A 01/07/2011, 1:11 PM

## 2011-01-07 NOTE — Anesthesia Postprocedure Evaluation (Signed)
  Anesthesia Post-op Note  Patient: [name]  Procedure(s) Performed:  LAPAROSCOPY OPERATIVE; LAPAROTOMY; UNILATERAL SALPINGECTOMY  Patient Location: PACU and A-ICU  Anesthesia Type: General  Level of Consciousness: awake, alert  and oriented  Airway and Oxygen Therapy: Patient Spontanous Breathing  Post-op Pain: moderate  Post-op Assessment: Post-op Vital signs reviewed  Post-op Vital Signs: Reviewed and stable  Complications: No apparent anesthesia complications

## 2011-01-08 DIAGNOSIS — D62 Acute posthemorrhagic anemia: Secondary | ICD-10-CM | POA: Diagnosis present

## 2011-01-08 LAB — TYPE AND SCREEN
ABO/RH(D): O POS
Unit division: 0

## 2011-01-08 LAB — DIFFERENTIAL
Basophils Absolute: 0 10*3/uL (ref 0.0–0.1)
Eosinophils Relative: 0 % (ref 0–5)
Lymphocytes Relative: 28 % (ref 12–46)
Lymphs Abs: 1.9 10*3/uL (ref 0.7–4.0)
Neutro Abs: 4.5 10*3/uL (ref 1.7–7.7)

## 2011-01-08 LAB — CBC
MCV: 90.2 fL (ref 78.0–100.0)
Platelets: 132 10*3/uL — ABNORMAL LOW (ref 150–400)
RBC: 2.35 MIL/uL — ABNORMAL LOW (ref 3.87–5.11)
RDW: 15.4 % (ref 11.5–15.5)
WBC: 6.9 10*3/uL (ref 4.0–10.5)

## 2011-01-08 MED ORDER — SODIUM CHLORIDE 0.9 % IJ SOLN
3.0000 mL | Freq: Two times a day (BID) | INTRAMUSCULAR | Status: DC
  Administered 2011-01-09: 3 mL via INTRAVENOUS

## 2011-01-08 MED ORDER — EMTRICITABINE-TENOFOVIR DF 200-300 MG PO TABS
1.0000 | ORAL_TABLET | Freq: Every day | ORAL | Status: DC
  Administered 2011-01-09: 1 via ORAL
  Filled 2011-01-08 (×2): qty 1

## 2011-01-08 MED ORDER — ATAZANAVIR SULFATE 150 MG PO CAPS
300.0000 mg | ORAL_CAPSULE | Freq: Every day | ORAL | Status: DC
  Administered 2011-01-09: 300 mg via ORAL
  Filled 2011-01-08 (×2): qty 2

## 2011-01-08 MED ORDER — FERROUS SULFATE 325 (65 FE) MG PO TABS
325.0000 mg | ORAL_TABLET | Freq: Two times a day (BID) | ORAL | Status: DC
  Administered 2011-01-08 – 2011-01-09 (×2): 325 mg via ORAL
  Filled 2011-01-08 (×3): qty 1

## 2011-01-08 MED ORDER — RITONAVIR 100 MG PO TABS
100.0000 mg | ORAL_TABLET | Freq: Every day | ORAL | Status: DC
  Administered 2011-01-09: 100 mg via ORAL
  Filled 2011-01-08 (×2): qty 1

## 2011-01-08 NOTE — Progress Notes (Signed)
2 Days Post-Op Procedure(s) (LRB): LAPAROSCOPY OPERATIVE (N/A) LAPAROTOMY (N/A) UNILATERAL SALPINGECTOMY (Right)  Subjective: Patient reports incision pain   Objective: Vital signs in last 24 hours: Temp:  [97.9 F (36.6 C)-98.3 F (36.8 C)] 98.1 F (36.7 C) (10/18 0800) Pulse Rate:  [90-120] 103  (10/18 0700) Resp:  [16-27] 19  (10/18 0800) BP: (103-138)/(51-92) 117/70 mmHg (10/18 0809) SpO2:  [97 %-100 %] 100 % (10/18 0800) Last BM Date: 01/06/11  Intake/Output from previous day: 10/17 0701 - 10/18 0700 In: 5108.7 [P.O.:1060; I.V.:3377.5; Blood:669.2; IV Piggyback:2] Out: 1610 [RUEAV:4098; Emesis/NG output:40] Intake/Output this shift: Total I/O In: 300 [I.V.:300] Out: -   Physical Examination:  General: alert GI: soft, non-tender; bowel sounds normal; no masses,  no organomegaly and incision: clean, dry and intact Extremities: extremities normal, atraumatic, no cyanosis or edema   Labs:  WBC/Hgb/Hct/Plts:  6.9/7.3/21.2/132 (10/18 1191)    Assessment:  25 y.o. s/p Procedure(s): LAPAROSCOPY OPERATIVE LAPAROTOMY UNILATERAL SALPINGECTOMY: stable and anemia  Pain: Pain is well-controlled  oral medications.  Heme:Anemia: Stable  GI:  Tolerating po:     Prophylaxis: intermittent pneumatic compression boots.  Plan: Advance diet Encourage ambulation  Transfer-->floor D/C Foley catheter  LOS: 2 days     JACKSON-MOORE,Maxim Bedel A 01/08/2011, 9:46 AM

## 2011-01-08 NOTE — Progress Notes (Signed)
Pt transferred to rm. 318 via W/C per MD order. Remained in AICU d/t continued c/o pain, nausea, and immobility.  After several hours of observation and pt's vitals remianed stable, pt then transferred.  She needs lots of encouragement to ambulate. Tia Alert A

## 2011-01-09 MED ORDER — OXYCODONE-ACETAMINOPHEN 5-325 MG PO TABS: 1.0000 | ORAL_TABLET | ORAL | Status: AC | PRN

## 2011-01-09 MED ORDER — THERA VITAL M PO TABS: 1.0000 | ORAL_TABLET | Freq: Every day | ORAL | Status: DC

## 2011-01-09 MED ORDER — IBUPROFEN 800 MG PO TABS: 800.0000 mg | ORAL_TABLET | Freq: Three times a day (TID) | ORAL | Status: DC | PRN

## 2011-01-09 MED ORDER — FERROUS SULFATE 325 (65 FE) MG PO TABS: 325.0000 mg | ORAL_TABLET | Freq: Two times a day (BID) | ORAL | Status: DC

## 2011-01-09 MED ORDER — BISACODYL 10 MG RE SUPP
10.0000 mg | Freq: Once | RECTAL | Status: AC
  Administered 2011-01-09: 10 mg via RECTAL
  Filled 2011-01-09: qty 1

## 2011-01-09 NOTE — Progress Notes (Signed)
Pt states that she is feeling some numbness in legs when ambulating... One person assist recommended... Front wheel walker provided to patient to help steady herself when ambulating. Encouraged pt to call for help when she needed to get up to go to the bathroom.

## 2011-01-09 NOTE — Plan of Care (Signed)
Problem: Phase II Progression Outcomes Goal: Remove staples if indicated/incision care Outcome: Not Applicable Date Met:  01/09/11 Not ordered

## 2011-01-09 NOTE — Plan of Care (Signed)
Problem: Discharge Progression Outcomes Goal: Discontinue staples (if applicable) Outcome: Not Applicable Date Met:  01/09/11 Not ordered

## 2011-01-09 NOTE — Progress Notes (Signed)
Patient was complaining of left hip numbness that was constant. I got her up to walk and she was walking very slow but could bare weight in both legs. Doctor was made aware will come to check during rounds.

## 2011-01-09 NOTE — Discharge Summary (Signed)
RN reviewed discharge instructions with patient and gave her prescriptions provided by MD, patient verbalized understanding. Patient had complaint of abdominal pain, RN gave prn med. Patient was wheeled off the unit in a wheelchair by nurse tech. She left the campus with her cousin.

## 2011-01-09 NOTE — Discharge Summary (Signed)
Physician Discharge Summary  Patient ID: [name] MRN: [id number] DOB/AGE: Jul 21, 1985 25 y.o.  Admit date: 01/06/2011 Discharge date: 01/09/2011  Admission Diagnoses:  Acute abdomen, early pregnant, hemoperitoneum  Discharge Diagnoses:  Active Problems:  Ectopic pregnancy  Anemia associated with acute blood loss   Discharged Condition: stable  Hospital Course:   The patient had presented to the [facility] Emergency department with sudden acute onset of abdominal pain. The patient was found to be pregnant. A quantitative beta of hCG was approximately 2600. A pelvic ultrasound showed normal adnexa; no free peritoneal fluid was noted in the posterior cul-de-sac. There was a question of a pseudo-sac in the uterine cavity. The patient had a hypotensive episode while in the ED which responded to IV boluses. An nitial hemoglobin was in the 11-12 range. The patient was then transferred to the maternity admissions unit. A repeat hemoglobin was in the 8 range. The patient was complaining of referred right sided shoulder pain. The decision was made to proceed with a diagnostic, possible operative laparoscopic laparoscopy. The patient was brought to the operating room. Please see the dictated operative summary for findings. Postoperatively the patient was transferred to the intensive care unit. A postoperative hemoglobin reached a nadir in the high 5 range. The patient was transfused 2 units of packed red blood cells.  A posttransfusion hemoglobin was 7.3. The patient was transferred from the intensive care unit. The patient's diet was gradually advanced. She was tolerating a regular diet on the day of discharge.    Significant Diagnostic Studies: radiology: Ultrasound.  Treatments: surgery: diagnostic laparoscopy, exploratory laparotomy, right cornual wedge resection with salpingectomy  Discharge Exam: Blood pressure 111/70, pulse 93, temperature 98.5 F (36.9 C), temperature  source Oral, resp. rate 18, height 5\' 5"  (1.651 m), weight 78.245 kg (172 lb 8 oz), last menstrual period 11/05/2010, SpO2 99.00%, unknown if currently breastfeeding. General appearance: alert GI: soft, non-tender; bowel sounds normal; no masses,  no organomegaly Neurologic: Sensory: normal Motor: grossly normal Incision/Wound:  Disposition: Home or Self Care  Discharge Orders    Future Orders Please Complete By Expires   Diet general      Increase activity slowly      Discharge instructions      Comments:   Routine   Driving Restrictions      Comments:   No driving x 2 weeks   Lifting restrictions      Comments:   No lifting >30 lbs for 6 weeks   Sexual Activity Restrictions      Comments:   No sexual activity for 6 weeks   Discharge wound care:      Comments:   Keep clean/dry   Call MD for:  temperature >100.4      Call MD for:  persistant nausea and vomiting      Call MD for:  severe uncontrolled pain      Call MD for:  redness, tenderness, or signs of infection (pain, swelling, redness, odor or green/yellow discharge around incision site)      Call MD for:  persistant dizziness or light-headedness      Call MD for:  extreme fatigue        Current Discharge Medication List    START taking these medications   Details  ferrous sulfate 325 (65 FE) MG tablet Take 1 tablet (325 mg total) by mouth 2 (two) times daily with a meal. Qty: 120 tablet, Refills: 3    ibuprofen (ADVIL,MOTRIN) 800 MG tablet  Take 1 tablet (800 mg total) by mouth every 8 (eight) hours as needed for pain. Qty: 30 tablet, Refills: 0    Multiple Vitamins-Minerals (MULTIVITAMIN) tablet Take 1 tablet by mouth daily. Qty: 120 tablet, Refills: 3    oxyCODONE-acetaminophen (PERCOCET) 5-325 MG per tablet Take 1-2 tablets by mouth every 3 (three) hours as needed (moderate to severe pain (when tolerating fluids)). Qty: 40 tablet, Refills: 0      CONTINUE these medications which have NOT CHANGED   Details   atazanavir (REYATAZ) 150 MG capsule Take 2 capsules (300 mg total) by mouth daily with breakfast. Qty: 60 capsule, Refills: 11   Associated Diagnoses: Human immunodeficiency virus (HIV) disease    emtricitabine-tenofovir (TRUVADA) 200-300 MG per tablet Take 1 tablet by mouth daily. Qty: 30 tablet, Refills: 11   Associated Diagnoses: Human immunodeficiency virus (HIV) disease    ritonavir (NORVIR) 100 MG TABS Take 1 tablet (100 mg total) by mouth daily with breakfast. Qty: 30 tablet, Refills: 11   Associated Diagnoses: Human immunodeficiency virus (HIV) disease         Signed: JACKSON-MOORE,Noha Karasik A 01/09/2011, 11:00 AM

## 2011-01-09 NOTE — Progress Notes (Signed)
3 Days Post-Op Procedure(s) (LRB): LAPAROSCOPY OPERATIVE (N/A) LAPAROTOMY (N/A) UNILATERAL SALPINGECTOMY (Right)  Subjective: Patient reports tolerating PO.  C/O right-sided shoulder pain, B/L anterior thigh numbness--L>R.  Objective: Vital signs in last 24 hours: Temp:  [98.1 F (36.7 C)-98.7 F (37.1 C)] 98.7 F (37.1 C) (10/19 0505) Pulse Rate:  [87-106] 102  (10/19 0505) Resp:  [16-18] 18  (10/19 0505) BP: (100-132)/(61-86) 100/61 mmHg (10/19 0505) SpO2:  [98 %-100 %] 98 % (10/19 0505) Last BM Date: 01/06/11  Intake/Output from previous day: 10/18 0701 - 10/19 0700 In: 1170 [P.O.:720; I.V.:450] Out: 1650 [Urine:1650] Intake/Output this shift: Total I/O In: -  Out: 350 [Urine:350]  Physical Examination:  General: alert GI: soft, non-tender; bowel sounds normal; no masses,  no organomegaly and incision: clean, dry and intact LE:  strength OK, neuro grossly intac   Labs:       Assessment:  25 y.o. s/p Procedure(s): LAPAROSCOPY OPERATIVE LAPAROTOMY UNILATERAL SALPINGECTOMY: stable and tolerating diet  Pain: Pain is well-controlled on  or oral medications.  Heme:Anemia:  PLAN: Continue Fe/multivitamin    GI:  Tolerating po: Yes    ?Etiology of musculoskeletal complaints.  Doubt b/l femoral neuropathy secondary to recent surgery.  No motor deficits. Plan: Encourage ambulation Discharge home later today   LOS: 3 days     JACKSON-MOORE,Oceanna Arruda A 01/09/2011, 10:29 AM

## 2011-01-12 ENCOUNTER — Encounter (HOSPITAL_COMMUNITY): Payer: Self-pay | Admitting: Obstetrics & Gynecology

## 2011-01-19 ENCOUNTER — Emergency Department (HOSPITAL_COMMUNITY)
Admission: EM | Admit: 2011-01-19 | Discharge: 2011-01-19 | Disposition: A | Discharge status: Home / Self Care | Payer: Medicaid Other | Attending: Emergency Medicine | Admitting: Emergency Medicine

## 2011-01-19 ENCOUNTER — Emergency Department (HOSPITAL_COMMUNITY): Payer: Medicaid Other

## 2011-01-19 DIAGNOSIS — M79609 Pain in unspecified limb: Secondary | ICD-10-CM | POA: Insufficient documentation

## 2011-01-19 DIAGNOSIS — S62319A Displaced fracture of base of unspecified metacarpal bone, initial encounter for closed fracture: Secondary | ICD-10-CM | POA: Insufficient documentation

## 2011-01-19 DIAGNOSIS — M7989 Other specified soft tissue disorders: Secondary | ICD-10-CM | POA: Insufficient documentation

## 2011-01-27 NOTE — Transfer of Care (Incomplete)
Immediate Anesthesia Transfer of Care Note  Patient: [name]  Procedure(s) Performed:  LAPAROSCOPY OPERATIVE; LAPAROTOMY; UNILATERAL SALPINGECTOMY  Patient Location: {PLACES; ANE POST:19477::"PACU"}  Anesthesia Type: {PROCEDURES; ANE POST ANESTHESIA TYPE:19480}  Level of Consciousness: {FINDINGS; ANE POST LEVEL OF CONSCIOUSNESS:19484}  Airway & Oxygen Therapy: {Exam; oxygen device:30095}  Post-op Assessment: {ASSESSMENT;POST-OP ZHYQMV:78469}  Post vital signs: {DESC; ANE POST GEXBMW:41324}  Complications: {FINDINGS; ANE POST COMPLICATIONS:19485}

## 2011-03-19 ENCOUNTER — Telehealth: Payer: Self-pay | Admitting: *Deleted

## 2011-03-19 NOTE — Telephone Encounter (Signed)
Patient number on file is not a valid number. Not able to contact the patient to have a follow up appointment scheduled.

## 2011-03-24 NOTE — L&D Delivery Note (Signed)
Delivery Note At 10:15 PM a viable female was delivered via  (Presentation: LOA).   Placenta status: delivered w/cord traction/intact.    Anesthesia: Epidural  Episiotomy: none Lacerations: labial Suture Repair: 3.0 vicryl rapide Est. Blood Loss (mL): 150 ml  Mom to postpartum.  Baby to nursery-stable.  JACKSON-MOORE,Sumayyah Custodio A 11/29/2011, 10:36 PM

## 2011-04-09 ENCOUNTER — Other Ambulatory Visit: Payer: Self-pay | Admitting: *Deleted

## 2011-04-09 DIAGNOSIS — B20 Human immunodeficiency virus [HIV] disease: Secondary | ICD-10-CM

## 2011-04-09 MED ORDER — EMTRICITABINE-TENOFOVIR DF 200-300 MG PO TABS: 1.0000 | ORAL_TABLET | Freq: Every day | ORAL | Status: DC

## 2011-04-09 MED ORDER — RITONAVIR 100 MG PO TABS: 100.0000 mg | ORAL_TABLET | Freq: Every day | ORAL | Status: DC

## 2011-04-09 MED ORDER — ATAZANAVIR SULFATE 150 MG PO CAPS: 300.0000 mg | ORAL_CAPSULE | Freq: Every day | ORAL | Status: DC

## 2011-04-20 ENCOUNTER — Emergency Department (HOSPITAL_COMMUNITY)
Admission: EM | Admit: 2011-04-20 | Discharge: 2011-04-20 | Disposition: A | Discharge status: Home / Self Care | Payer: Medicaid Other

## 2011-04-22 ENCOUNTER — Inpatient Hospital Stay (HOSPITAL_COMMUNITY)
Admission: AD | Admit: 2011-04-22 | Discharge: 2011-04-22 | Disposition: A | Discharge status: Home / Self Care | Payer: Medicaid Other | Source: Ambulatory Visit | Attending: Obstetrics & Gynecology | Admitting: Obstetrics & Gynecology

## 2011-04-22 ENCOUNTER — Encounter (HOSPITAL_COMMUNITY): Payer: Self-pay | Admitting: *Deleted

## 2011-04-22 ENCOUNTER — Inpatient Hospital Stay (HOSPITAL_COMMUNITY): Payer: Medicaid Other

## 2011-04-22 DIAGNOSIS — Z21 Asymptomatic human immunodeficiency virus [HIV] infection status: Secondary | ICD-10-CM | POA: Insufficient documentation

## 2011-04-22 DIAGNOSIS — O98519 Other viral diseases complicating pregnancy, unspecified trimester: Secondary | ICD-10-CM | POA: Insufficient documentation

## 2011-04-22 DIAGNOSIS — O210 Mild hyperemesis gravidarum: Secondary | ICD-10-CM | POA: Insufficient documentation

## 2011-04-22 DIAGNOSIS — Z3201 Encounter for pregnancy test, result positive: Secondary | ICD-10-CM

## 2011-04-22 HISTORY — DX: Depression, unspecified: F32.A

## 2011-04-22 HISTORY — DX: Major depressive disorder, single episode, unspecified: F32.9

## 2011-04-22 HISTORY — DX: Asymptomatic human immunodeficiency virus (hiv) infection status: Z21

## 2011-04-22 LAB — URINALYSIS, ROUTINE W REFLEX MICROSCOPIC
Glucose, UA: NEGATIVE mg/dL
Hgb urine dipstick: NEGATIVE
Ketones, ur: NEGATIVE mg/dL
Protein, ur: NEGATIVE mg/dL

## 2011-04-22 LAB — CBC
Platelets: 261 10*3/uL (ref 150–400)
RBC: 3.65 MIL/uL — ABNORMAL LOW (ref 3.87–5.11)
RDW: 15 % (ref 11.5–15.5)
WBC: 6.6 10*3/uL (ref 4.0–10.5)

## 2011-04-22 LAB — HCG, QUANTITATIVE, PREGNANCY: hCG, Beta Chain, Quant, S: 35261 m[IU]/mL — ABNORMAL HIGH (ref <5)

## 2011-04-22 LAB — WET PREP, GENITAL: Trich, Wet Prep: NONE SEEN

## 2011-04-22 LAB — POCT PREGNANCY, URINE: Preg Test, Ur: POSITIVE — AB

## 2011-04-22 LAB — URINE MICROSCOPIC-ADD ON

## 2011-04-22 MED ORDER — PROMETHAZINE HCL 25 MG PO TABS: 25.0000 mg | ORAL_TABLET | Freq: Four times a day (QID) | ORAL | Status: DC | PRN

## 2011-04-22 MED ORDER — PRENATAL RX 60-1 MG PO TABS: 1.0000 | ORAL_TABLET | Freq: Every day | ORAL | Status: DC

## 2011-04-22 NOTE — ED Provider Notes (Signed)
History     Chief Complaint  Patient presents with  . Nausea   HPI [name] 26 y.o.  OB History    Grav Para Term Preterm Abortions TAB SAB Ect Mult Living   6 3 3  0 2 0 0 2 0 2      Past Medical History  Diagnosis Date  . Depression   positive HIV  Past Surgical History  Procedure Date  . Laparoscopy 01/06/2011    Procedure: LAPAROSCOPY OPERATIVE;  Surgeon: Roseanna Rainbow, MD;  Location: [facility];  Service: Gynecology;  Laterality: N/A;  . Laparotomy 01/06/2011    Procedure: LAPAROTOMY;  Surgeon: Roseanna Rainbow, MD;  Location: [facility];  Service: Gynecology;  Laterality: N/A;  . Dilation and curettage of uterus   . Salpingoophorectomy     No family history on file.  History  Substance Use Topics  . Smoking status: Current Everyday Smoker -- 0.5 packs/day    Types: Cigarettes  . Smokeless tobacco: Never Used  . Alcohol Use: No     beer    Allergies: No Known Allergies  Prescriptions prior to admission  Medication Sig Dispense Refill  . albuterol (PROVENTIL HFA;VENTOLIN HFA) 108 (90 BASE) MCG/ACT inhaler Inhale 2 puffs into the lungs every 6 (six) hours as needed. Asthma attacks      . atazanavir (REYATAZ) 150 MG capsule Take 2 capsules (300 mg total) by mouth daily with breakfast.  60 capsule  0  . emtricitabine-tenofovir (TRUVADA) 200-300 MG per tablet Take 1 tablet by mouth daily.  30 tablet  0  . ritonavir (NORVIR) 100 MG TABS Take 1 tablet (100 mg total) by mouth daily with breakfast.  30 tablet  0    Review of Systems  Constitutional: Negative for fever.  Gastrointestinal: Positive for nausea and abdominal pain. Negative for vomiting and diarrhea.  Genitourinary: Negative for dysuria.   Physical Exam   Blood pressure 126/72, pulse 95, temperature 98.2 F (36.8 C), temperature source Oral, resp. rate 18, height 5\' 4"  (1.626 m), weight 150 lb (68.04 kg), last menstrual period 02/16/2011, not currently breastfeeding.  Physical Exam    Nursing note and vitals reviewed. Constitutional: She is oriented to person, place, and time. She appears well-developed and well-nourished.  HENT:  Head: Normocephalic.  Eyes: EOM are normal.  Neck: Neck supple.  GI: Soft. There is no tenderness.  Genitourinary:       Speculum exam: Vagina - Small amount of creamy discharge, no odor Cervix - No contact bleeding Bimanual exam: Cervix closed Uterus non tender, 9 week size, Adnexa non tender, no masses bilaterally GC/Chlam, wet prep done Chaperone present for exam.  Musculoskeletal: Normal range of motion.  Neurological: She is alert and oriented to person, place, and time.  Skin: Skin is warm and dry.  Psychiatric: She has a normal mood and affect.    MAU Course  Procedures Results for orders placed during the hospital encounter of 04/22/11 (from the past 24 hour(s))  URINALYSIS, ROUTINE W REFLEX MICROSCOPIC     Status: Abnormal   Collection Time   04/22/11  5:58 PM      Component Value Range   Color, Urine YELLOW  YELLOW    APPearance HAZY (*) CLEAR    Specific Gravity, Urine 1.020  1.005 - 1.030    pH 7.5  5.0 - 8.0    Glucose, UA NEGATIVE  NEGATIVE (mg/dL)   Hgb urine dipstick NEGATIVE  NEGATIVE    Bilirubin Urine NEGATIVE  NEGATIVE    Ketones, ur NEGATIVE  NEGATIVE (mg/dL)   Protein, ur NEGATIVE  NEGATIVE (mg/dL)   Urobilinogen, UA 1.0  0.0 - 1.0 (mg/dL)   Nitrite NEGATIVE  NEGATIVE    Leukocytes, UA TRACE (*) NEGATIVE   URINE MICROSCOPIC-ADD ON     Status: Abnormal   Collection Time   04/22/11  5:58 PM      Component Value Range   Squamous Epithelial / LPF FEW (*) RARE    WBC, UA 0-2  <3 (WBC/hpf)   RBC / HPF 0-2  <3 (RBC/hpf)   Bacteria, UA MANY (*) RARE    Urine-Other MUCOUS PRESENT    POCT PREGNANCY, URINE     Status: Abnormal   Collection Time   04/22/11  6:48 PM      Component Value Range   Preg Test, Ur POSITIVE (*) NEGATIVE   WET PREP, GENITAL     Status: Abnormal   Collection Time   04/22/11  6:55  PM      Component Value Range   Yeast Wet Prep HPF POC NONE SEEN  NONE SEEN    Trich, Wet Prep NONE SEEN  NONE SEEN    Clue Cells Wet Prep HPF POC FEW (*) NONE SEEN    WBC, Wet Prep HPF POC FEW (*) NONE SEEN   ABO/RH     Status: Normal   Collection Time   04/22/11  7:13 PM      Component Value Range   ABO/RH(D) O POS    HCG, QUANTITATIVE, PREGNANCY     Status: Abnormal   Collection Time   04/22/11  7:14 PM      Component Value Range   hCG, Beta Chain, Quant, S 35261 (*) <5 (mIU/mL)  CBC     Status: Abnormal   Collection Time   04/22/11  7:14 PM      Component Value Range   WBC 6.6  4.0 - 10.5 (K/uL)   RBC 3.65 (*) 3.87 - 5.11 (MIL/uL)   Hemoglobin 10.8 (*) 12.0 - 15.0 (g/dL)   HCT 16.1 (*) 09.6 - 46.0 (%)   MCV 88.5  78.0 - 100.0 (fL)   MCH 29.6  26.0 - 34.0 (pg)   MCHC 33.4  30.0 - 36.0 (g/dL)   RDW 04.5  40.9 - 81.1 (%)   Platelets 261  150 - 400 (K/uL)    MDM Ultrasound 7w 3d IUP with FHT 136 and EDC 12-06-11  Assessment and Plan  Pregnancy 7w 3d History +HIV Morning sickness  Plan Has an appointment to begin prenatal care on Feb 12 Will prescribe PNV and Phenergan for nausea   Sarai January 04/22/2011, 8:38 PM   Nolene Bernheim, NP 04/22/11 2039

## 2011-04-22 NOTE — Progress Notes (Signed)
Pt is c/o Nausea and is unsure of dates for her pregnancy;

## 2011-04-23 LAB — GC/CHLAMYDIA PROBE AMP, GENITAL
Chlamydia, DNA Probe: NEGATIVE
GC Probe Amp, Genital: NEGATIVE

## 2011-05-05 LAB — OB RESULTS CONSOLE RUBELLA ANTIBODY, IGM: Rubella: IMMUNE

## 2011-05-05 LAB — OB RESULTS CONSOLE RPR: RPR: NONREACTIVE

## 2011-05-18 ENCOUNTER — Telehealth: Payer: Self-pay | Admitting: *Deleted

## 2011-05-18 DIAGNOSIS — B20 Human immunodeficiency virus [HIV] disease: Secondary | ICD-10-CM

## 2011-05-18 MED ORDER — ATAZANAVIR SULFATE 150 MG PO CAPS: 300.0000 mg | ORAL_CAPSULE | Freq: Every day | ORAL | Status: DC

## 2011-05-18 MED ORDER — RITONAVIR 100 MG PO TABS: 100.0000 mg | ORAL_TABLET | Freq: Every day | ORAL | Status: DC

## 2011-05-18 MED ORDER — EMTRICITABINE-TENOFOVIR DF 200-300 MG PO TABS: 1.0000 | ORAL_TABLET | Freq: Every day | ORAL | Status: DC

## 2011-05-18 NOTE — Telephone Encounter (Signed)
Yes, start norvir 100 mg daily, Reyataz 300 mg daily and Truvada 1 tab daily and get her into care

## 2011-05-18 NOTE — Telephone Encounter (Signed)
Received faxed request for refill of HIV medications.  Fax given to Dr. Luciana Axe to review and approve.  Phone call to pt.  Message left to call [facility] for MD and lab work appts.

## 2011-05-19 NOTE — Telephone Encounter (Signed)
Pt has appt for lab work (05/21/11)  and with you for 06/04/11.

## 2011-05-21 ENCOUNTER — Other Ambulatory Visit: Payer: Medicaid Other

## 2011-05-21 ENCOUNTER — Inpatient Hospital Stay (HOSPITAL_COMMUNITY)
Admission: AD | Admit: 2011-05-21 | Discharge: 2011-05-21 | Disposition: A | Discharge status: Home / Self Care | Payer: Medicaid Other | Source: Ambulatory Visit | Attending: Obstetrics | Admitting: Obstetrics

## 2011-05-21 ENCOUNTER — Encounter (HOSPITAL_COMMUNITY): Payer: Self-pay

## 2011-05-21 DIAGNOSIS — R109 Unspecified abdominal pain: Secondary | ICD-10-CM | POA: Insufficient documentation

## 2011-05-21 DIAGNOSIS — N72 Inflammatory disease of cervix uteri: Secondary | ICD-10-CM | POA: Insufficient documentation

## 2011-05-21 DIAGNOSIS — M545 Low back pain, unspecified: Secondary | ICD-10-CM | POA: Insufficient documentation

## 2011-05-21 DIAGNOSIS — O209 Hemorrhage in early pregnancy, unspecified: Secondary | ICD-10-CM

## 2011-05-21 DIAGNOSIS — N930 Postcoital and contact bleeding: Secondary | ICD-10-CM | POA: Insufficient documentation

## 2011-05-21 DIAGNOSIS — B20 Human immunodeficiency virus [HIV] disease: Secondary | ICD-10-CM

## 2011-05-21 DIAGNOSIS — N949 Unspecified condition associated with female genital organs and menstrual cycle: Secondary | ICD-10-CM

## 2011-05-21 LAB — CBC WITH DIFFERENTIAL/PLATELET
Eosinophils Absolute: 0.2 10*3/uL (ref 0.0–0.7)
Eosinophils Relative: 3 % (ref 0–5)
HCT: 35 % — ABNORMAL LOW (ref 36.0–46.0)
Hemoglobin: 11.2 g/dL — ABNORMAL LOW (ref 12.0–15.0)
Lymphs Abs: 1.6 10*3/uL (ref 0.7–4.0)
MCH: 28.9 pg (ref 26.0–34.0)
MCHC: 32 g/dL (ref 30.0–36.0)
MCV: 90.2 fL (ref 78.0–100.0)
Monocytes Absolute: 0.4 10*3/uL (ref 0.1–1.0)
Monocytes Relative: 5 % (ref 3–12)
RBC: 3.88 MIL/uL (ref 3.87–5.11)

## 2011-05-21 LAB — COMPLETE METABOLIC PANEL WITH GFR
BUN: 7 mg/dL (ref 6–23)
CO2: 26 mEq/L (ref 19–32)
Creat: 0.5 mg/dL (ref 0.50–1.10)
GFR, Est African American: >89 mL/min
GFR, Est Non African American: >89 mL/min
Glucose, Bld: 71 mg/dL (ref 70–99)
Sodium: 138 mEq/L (ref 135–145)
Total Bilirubin: 0.2 mg/dL — ABNORMAL LOW (ref 0.3–1.2)
Total Protein: 6.5 g/dL (ref 6.0–8.3)

## 2011-05-21 LAB — URINE MICROSCOPIC-ADD ON

## 2011-05-21 LAB — URINALYSIS, ROUTINE W REFLEX MICROSCOPIC
Bilirubin Urine: NEGATIVE
Ketones, ur: NEGATIVE mg/dL
Nitrite: NEGATIVE
Specific Gravity, Urine: 1.015 (ref 1.005–1.030)
Urobilinogen, UA: 0.2 mg/dL (ref 0.0–1.0)

## 2011-05-21 LAB — WET PREP, GENITAL

## 2011-05-21 MED ORDER — AZITHROMYCIN 250 MG PO TABS
1000.0000 mg | ORAL_TABLET | Freq: Once | ORAL | Status: AC
  Administered 2011-05-21: 1000 mg via ORAL
  Filled 2011-05-21: qty 4

## 2011-05-21 MED ORDER — ONDANSETRON 8 MG PO TBDP
8.0000 mg | ORAL_TABLET | Freq: Once | ORAL | Status: AC
  Administered 2011-05-21: 8 mg via ORAL
  Filled 2011-05-21: qty 1

## 2011-05-21 NOTE — ED Notes (Signed)
Patient Zofran and saltine crackers explained will wait about 15 minutes then give her the antibiotic.

## 2011-05-21 NOTE — ED Provider Notes (Signed)
History     CSN: [id number]  Arrival date & time 05/21/11  1115   None     Chief Complaint  Patient presents with  . Flank Pain    HPI [name] is a 26 y.o. female @ [redacted]w[redacted]d gestation who presents to MAU for low back pain and spotting after sexual intercourse at 2 am. Occasional cramping in lower abdomen. Her blood type is O positive.The history was provided by the patient and her old record.  Past Medical History  Diagnosis Date  . Depression   . HIV positive     Past Surgical History  Procedure Date  . Laparoscopy 01/06/2011    Procedure: LAPAROSCOPY OPERATIVE;  Surgeon: Roseanna Rainbow, MD;  Location: [facility];  Service: Gynecology;  Laterality: N/A;  . Laparotomy 01/06/2011    Procedure: LAPAROTOMY;  Surgeon: Roseanna Rainbow, MD;  Location: [facility];  Service: Gynecology;  Laterality: N/A;  . Dilation and curettage of uterus   . Salpingoophorectomy     No family history on file.  History  Substance Use Topics  . Smoking status: Current Everyday Smoker -- 0.5 packs/day    Types: Cigarettes  . Smokeless tobacco: Never Used  . Alcohol Use: No     beer    OB History    Grav Para Term Preterm Abortions TAB SAB Ect Mult Living   6 3 3  0 2 0 1 1 0 2      Review of Systems  Constitutional: Negative for fever, chills, diaphoresis and fatigue.  HENT: Negative for ear pain, congestion, sore throat, facial swelling, neck pain, neck stiffness, dental problem and sinus pressure.   Eyes: Negative for photophobia, pain and discharge.  Respiratory: Negative for cough, chest tightness and wheezing.   Gastrointestinal: Positive for nausea. Negative for vomiting, abdominal pain (mild cramping), diarrhea, constipation and abdominal distention.  Genitourinary: Positive for vaginal bleeding and vaginal discharge. Negative for dysuria, frequency, flank pain and difficulty urinating.  Musculoskeletal: Negative for myalgias, back pain and gait problem.  Skin: Negative  for color change and rash.  Neurological: Negative for dizziness, speech difficulty, weakness, light-headedness, numbness and headaches.  Psychiatric/Behavioral: Negative for confusion and agitation.    Allergies  Review of patient's allergies indicates no known allergies.  Home Medications  No current outpatient prescriptions on file.  BP 124/73  Pulse 100  Temp(Src) 97.1 F (36.2 C) (Oral)  Resp 16  Ht 5' 1.5" (1.562 m)  Wt 171 lb 12.8 oz (77.928 kg)  BMI 31.94 kg/m2  SpO2 100%  LMP 02/16/2011  Physical Exam  Nursing note and vitals reviewed. Constitutional: She is oriented to person, place, and time. She appears well-developed and well-nourished.  HENT:  Head: Normocephalic.  Eyes: EOM are normal.  Neck: Neck supple.  Cardiovascular: Normal rate.   Pulmonary/Chest: Effort normal.  Abdominal: Soft. There is no tenderness.       + FHT  Genitourinary:       External genitalia without lesions. Mucous discharge vaginal vault, blood tinged. Cervix with erythema. Uterus approximately 14 week size.  Musculoskeletal: Normal range of motion.  Neurological: She is alert and oriented to person, place, and time. No cranial nerve deficit.  Skin: Skin is warm and dry.  Psychiatric: She has a normal mood and affect. Her behavior is normal. Judgment and thought content normal.   Results for orders placed during the hospital encounter of 05/21/11 (from the past 24 hour(s))  URINALYSIS, ROUTINE W REFLEX MICROSCOPIC  Status: Abnormal   Collection Time   05/21/11 11:20 AM      Component Value Range   Color, Urine YELLOW  YELLOW    APPearance CLEAR  CLEAR    Specific Gravity, Urine 1.015  1.005 - 1.030    pH 6.5  5.0 - 8.0    Glucose, UA NEGATIVE  NEGATIVE (mg/dL)   Hgb urine dipstick TRACE (*) NEGATIVE    Bilirubin Urine NEGATIVE  NEGATIVE    Ketones, ur NEGATIVE  NEGATIVE (mg/dL)   Protein, ur NEGATIVE  NEGATIVE (mg/dL)   Urobilinogen, UA 0.2  0.0 - 1.0 (mg/dL)   Nitrite  NEGATIVE  NEGATIVE    Leukocytes, UA NEGATIVE  NEGATIVE   URINE MICROSCOPIC-ADD ON     Status: Abnormal   Collection Time   05/21/11 11:20 AM      Component Value Range   Squamous Epithelial / LPF FEW (*) RARE    WBC, UA 0-2  <3 (WBC/hpf)   RBC / HPF 0-2  <3 (RBC/hpf)  WET PREP, GENITAL     Status: Abnormal   Collection Time   05/21/11 12:10 PM      Component Value Range   Yeast Wet Prep HPF POC NONE SEEN  NONE SEEN    Trich, Wet Prep NONE SEEN  NONE SEEN    Clue Cells Wet Prep HPF POC FEW (*) NONE SEEN    WBC, Wet Prep HPF POC MANY (*) NONE SEEN    ED Course  Procedures : Informal bedside ultrasound done and shows active IUP with cardiac activity. Patient able to visualize heart beat. Pictures given to patient.  Assessment: Postcoital bleeding   Cervicitis.   Round ligament pain  Plan:  Pelvic rest   Zithromax 1 gram po   Follow up in the office, return here as needed. MDM         Kerrie Buffalo, NP 05/21/11 1253

## 2011-05-21 NOTE — Progress Notes (Signed)
Patient states she started having right flank pain and a little spotting after intercourse.

## 2011-05-21 NOTE — Discharge Instructions (Signed)
Cervicitis Cervicitis is a soreness and puffiness (inflammation) of the cervix. The cervix is at the bottom of the uterus. Your doctor will do an exam to find the cause. Your treatment will depend on the cause. If the cause is a sexual infection, you and your partner both need treatment. HOME CARE  Do not have sex (intercourse) until your doctor says it is okay.   Do not have sex until your partner is treated if your doctor told you not to.   Take medicines (antibiotics) as told. Finish them even if you start to feel better.  GET HELP RIGHT AWAY IF:   Your problems come back.   You have a fever.   You have problems that may be caused by your medicine.  MAKE SURE YOU:   Understand these instructions.   Will watch your condition.   Will get help right away if you are not doing well or get worse.  Document Released: 12/17/2007 Document Revised: 11/19/2010 Document Reviewed: 10/06/2010 [facility] Patient Information 2012 [place], [place].Cervicitis Cervicitis is a soreness and puffiness (inflammation) of the cervix. The cervix is at the bottom of the uterus. Your doctor will do an exam to find the cause. Your treatment will depend on the cause. If the cause is a sexual infection, you and your partner both need treatment. HOME CARE  Do not have sex (intercourse) until your doctor says it is okay.   Do not have sex until your partner is treated if your doctor told you not to.   Take medicines (antibiotics) as told. Finish them even if you start to feel better.  GET HELP RIGHT AWAY IF:   Your problems come back.   You have a fever.   You have problems that may be caused by your medicine.  MAKE SURE YOU:   Understand these instructions.   Will watch your condition.   Will get help right away if you are not doing well or get worse.  Document Released: 12/17/2007 Document Revised: 11/19/2010 Document Reviewed: 10/06/2010 [facility] Patient Information 2012 [place] [employer], [place].

## 2011-05-22 LAB — T-HELPER CELL (CD4) - (RCID CLINIC ONLY): CD4 T Cell Abs: 660 uL (ref 400–2700)

## 2011-05-22 LAB — GC/CHLAMYDIA PROBE AMP, GENITAL: Chlamydia, DNA Probe: NEGATIVE

## 2011-06-04 ENCOUNTER — Encounter: Payer: Self-pay | Admitting: Internal Medicine

## 2011-06-04 ENCOUNTER — Ambulatory Visit (INDEPENDENT_AMBULATORY_CARE_PROVIDER_SITE_OTHER): Payer: Medicaid Other | Admitting: Internal Medicine

## 2011-06-04 VITALS — BP 121/80 | HR 94 | Temp 98.1°F | Wt 198.0 lb

## 2011-06-04 DIAGNOSIS — B20 Human immunodeficiency virus [HIV] disease: Secondary | ICD-10-CM

## 2011-06-05 NOTE — Progress Notes (Signed)
  Subjective:    Patient ID: [name], female    DOB: July 16, 1985, 26 y.o.   MRN: [id number]  HPI here for follow up and now pregnant. She has been on a regimen of boosted atazanavir with truvada and last labs prior to this visit show an undetectable viral load.  Unofortuntaely, she thought that since she was pregnant, that she should not take the meds and stopped her ARVs.  She otherwise has established care with OB/Gyn and is in her second trimester.      Review of Systems  Constitutional: Negative for fever, chills, appetite change and unexpected weight change.  HENT: Negative for sore throat and trouble swallowing.   Respiratory: Negative for cough, chest tightness and shortness of breath.   Cardiovascular: Negative for chest pain, palpitations and leg swelling.  Gastrointestinal: Negative for nausea, abdominal pain, diarrhea and constipation.  Musculoskeletal: Negative for myalgias and arthralgias.  Skin: Negative for pallor and rash.  Neurological: Negative for headaches.  Hematological: Negative for adenopathy.  Psychiatric/Behavioral: Negative for sleep disturbance and dysphoric mood. The patient is not nervous/anxious.        Objective:   Physical Exam  Constitutional: She appears well-developed and well-nourished. No distress.  HENT:  Mouth/Throat: Oropharynx is clear and moist. No oropharyngeal exudate.  Cardiovascular: Normal rate, regular rhythm and normal heart sounds.  Exam reveals no gallop and no friction rub.   No murmur heard. Pulmonary/Chest: Effort normal. No respiratory distress. She has no wheezes. She has no rales.  Abdominal: Soft. She exhibits no distension. There is no tenderness.  Lymphadenopathy:    She has no cervical adenopathy.          Assessment & Plan:

## 2011-06-05 NOTE — Assessment & Plan Note (Signed)
I educated her on the need to take the medication to maximize the chance to have an HIV- baby.  I explained that her regimen is a standard regimen for pregnant women and she should start back on it today.  She voiced her understanding.  She will return in about 3 weeks for labs and follow up after that.

## 2011-06-15 ENCOUNTER — Other Ambulatory Visit: Payer: Self-pay | Admitting: Internal Medicine

## 2011-06-15 DIAGNOSIS — B20 Human immunodeficiency virus [HIV] disease: Secondary | ICD-10-CM

## 2011-06-16 ENCOUNTER — Inpatient Hospital Stay (HOSPITAL_COMMUNITY)
Admission: AD | Admit: 2011-06-16 | Discharge: 2011-06-16 | Disposition: A | Discharge status: Home / Self Care | Payer: Medicaid Other | Source: Ambulatory Visit | Attending: Obstetrics | Admitting: Obstetrics

## 2011-06-16 ENCOUNTER — Encounter (HOSPITAL_COMMUNITY): Payer: Self-pay | Admitting: *Deleted

## 2011-06-16 DIAGNOSIS — R51 Headache: Secondary | ICD-10-CM | POA: Insufficient documentation

## 2011-06-16 DIAGNOSIS — O99891 Other specified diseases and conditions complicating pregnancy: Secondary | ICD-10-CM | POA: Insufficient documentation

## 2011-06-16 LAB — URINALYSIS, ROUTINE W REFLEX MICROSCOPIC
Bilirubin Urine: NEGATIVE
Ketones, ur: NEGATIVE mg/dL
Nitrite: NEGATIVE
Protein, ur: NEGATIVE mg/dL
pH: 6.5 (ref 5.0–8.0)

## 2011-06-16 LAB — URINE MICROSCOPIC-ADD ON

## 2011-06-16 MED ORDER — DIPHENHYDRAMINE HCL 50 MG/ML IJ SOLN
25.0000 mg | Freq: Once | INTRAMUSCULAR | Status: AC
  Administered 2011-06-16: 25 mg via INTRAVENOUS
  Filled 2011-06-16: qty 1

## 2011-06-16 MED ORDER — PROMETHAZINE HCL 25 MG/ML IJ SOLN
25.0000 mg | Freq: Once | INTRAMUSCULAR | Status: AC
  Administered 2011-06-16: 25 mg via INTRAVENOUS
  Filled 2011-06-16: qty 1

## 2011-06-16 MED ORDER — DEXAMETHASONE SODIUM PHOSPHATE 10 MG/ML IJ SOLN
10.0000 mg | Freq: Once | INTRAMUSCULAR | Status: AC
  Administered 2011-06-16: 10 mg via INTRAVENOUS
  Filled 2011-06-16: qty 1

## 2011-06-16 MED ORDER — LACTATED RINGERS IV BOLUS (SEPSIS)
1000.0000 mL | Freq: Once | INTRAVENOUS | Status: AC
  Administered 2011-06-16: 1000 mL via INTRAVENOUS

## 2011-06-16 NOTE — MAU Provider Note (Signed)
History   Pt presents today c/o chronic HA for the past 2 wks. She states she normally takes Tylenol but this is no longer helping. She states the HA feels like someone is squeezing her head. She denies blurry vision, chest pain, SOB, fever, or any other sx at this time.  CSN: [id number]  Arrival date and time: 06/16/11 7829   First Provider Initiated Contact with Patient 06/16/11 2016      No chief complaint on file.  HPI  OB History    Grav Para Term Preterm Abortions TAB SAB Ect Mult Living   5 2 2  0 2 0 1 1 0 2      Past Medical History  Diagnosis Date  . Depression   . HIV positive     Past Surgical History  Procedure Date  . Laparoscopy 01/06/2011    Procedure: LAPAROSCOPY OPERATIVE;  Surgeon: Roseanna Rainbow, MD;  Location: [facility];  Service: Gynecology;  Laterality: N/A;  . Laparotomy 01/06/2011    Procedure: LAPAROTOMY;  Surgeon: Roseanna Rainbow, MD;  Location: [facility];  Service: Gynecology;  Laterality: N/A;  . Dilation and curettage of uterus   . Salpingoophorectomy     Family History  Problem Relation Age of Onset  . Diabetes Mother   . Hypertension Mother     History  Substance Use Topics  . Smoking status: Former Smoker    Types: Cigarettes  . Smokeless tobacco: Never Used  . Alcohol Use: No     beer    Allergies: No Known Allergies  Prescriptions prior to admission  Medication Sig Dispense Refill  . NORVIR 100 MG TABS TAKE 1 TABLET (100 MG TOTAL) BY MOUTH DAILY WITH BREAKFAST.  30 tablet  3  . Prenatal Vit-Fe Fumarate-FA (PRENATAL MULTIVITAMIN) 60-1 MG tablet Take 1 tablet by mouth daily. Generic is OK.  30 tablet  0  . REYATAZ 150 MG capsule TAKE TWO   CAPSULES (300 MG TOTAL) BY MOUTH DAILY WITH BREAKFAST.  60 capsule  3  . TRUVADA 200-300 MG per tablet TAKE 1 TABLET BY MOUTH DAILY.  30 tablet  3    Review of Systems  Constitutional: Negative for fever and chills.  HENT: Negative for hearing loss, ear pain, nosebleeds, congestion,  sore throat, tinnitus and ear discharge.   Eyes: Negative for blurred vision, double vision, photophobia and pain.  Respiratory: Negative for cough, hemoptysis, sputum production, shortness of breath, wheezing and stridor.   Cardiovascular: Negative for chest pain and palpitations.  Gastrointestinal: Negative for nausea, vomiting, abdominal pain, diarrhea and constipation.  Genitourinary: Negative for dysuria, urgency, frequency and hematuria.  Neurological: Positive for headaches. Negative for dizziness.  Psychiatric/Behavioral: Negative for depression and suicidal ideas.   Physical Exam   Blood pressure 121/61, pulse 108, temperature 98.3 F (36.8 C), temperature source Oral, resp. rate 20, height 5\' 4"  (1.626 m), weight 183 lb (83.008 kg), last menstrual period 02/16/2011.  Physical Exam  Nursing note and vitals reviewed. Constitutional: She is oriented to person, place, and time. She appears well-developed and well-nourished. No distress.  HENT:  Head: Normocephalic and atraumatic.  Eyes: Conjunctivae and EOM are normal. Pupils are equal, round, and reactive to light. Right eye exhibits no discharge. Left eye exhibits no discharge. No scleral icterus.  GI: Soft. She exhibits no distension and no mass. There is no tenderness. There is no rebound and no guarding.  Neurological: She is alert and oriented to person, place, and time. No cranial nerve deficit.  Skin: Skin is warm and dry. She is not diaphoretic.  Psychiatric: She has a normal mood and affect. Her behavior is normal. Judgment and thought content normal.    MAU Course  Procedures  Results for orders placed during the hospital encounter of 06/16/11 (from the past 24 hour(s))  URINALYSIS, ROUTINE W REFLEX MICROSCOPIC     Status: Abnormal   Collection Time   06/16/11  8:00 PM      Component Value Range   Color, Urine YELLOW  YELLOW    APPearance HAZY (*) CLEAR    Specific Gravity, Urine 1.025  1.005 - 1.030    pH 6.5   5.0 - 8.0    Glucose, UA NEGATIVE  NEGATIVE (mg/dL)   Hgb urine dipstick NEGATIVE  NEGATIVE    Bilirubin Urine NEGATIVE  NEGATIVE    Ketones, ur NEGATIVE  NEGATIVE (mg/dL)   Protein, ur NEGATIVE  NEGATIVE (mg/dL)   Urobilinogen, UA 0.2  0.0 - 1.0 (mg/dL)   Nitrite NEGATIVE  NEGATIVE    Leukocytes, UA SMALL (*) NEGATIVE   URINE MICROSCOPIC-ADD ON     Status: Abnormal   Collection Time   06/16/11  8:00 PM      Component Value Range   Squamous Epithelial / LPF FEW (*) RARE    WBC, UA 0-2  <3 (WBC/hpf)   Bacteria, UA FEW (*) RARE    Pt HA has completely resolved following IV hydration and medication.  Assessment and Plan  HA: discussed with pt at length. She will f/u with Dr. Verdell Carmine office. Discussed diet, activity, risks, and precautions.  Clinton Gallant. Bobak Oguinn III, DrHSc, MPAS, PA-C  06/16/2011, 8:21 PM

## 2011-06-16 NOTE — MAU Note (Signed)
PT SAYS  HAS HAD H/A X2 WEEKS - OFF/ON.   TOOK TYLENOL-  DOES NOT HELP ANYMORE.      SHE CALLED OFFICE THIS AFTERNOON- THEY TOLD HER TO COME HERE.

## 2011-06-16 NOTE — Discharge Instructions (Signed)
Headache Headaches are caused by many different problems. Most commonly, headache is caused by muscle tension from an injury, fatigue, or emotional upset. Excessive muscle contractions in the scalp and neck result in a headache that often feels like a tight band around the head. Tension headaches often have areas of tenderness over the scalp and the back of the neck. These headaches may last for hours, days, or longer, and some may contribute to migraines in those who have migraine problems. Migraines usually cause a throbbing headache, which is made worse by activity. Sometimes only one side of the head hurts. Nausea, vomiting, eye pain, and avoidance of food are common with migraines. Visual symptoms such as light sensitivity, blind spots, or flashing lights may also occur. Loud noises may worsen migraine headaches. Many factors may cause migraine headaches:  Emotional stress, lack of sleep, and menstrual periods.   Alcohol and some drugs (such as birth control pills).   Diet factors (fasting, caffeine, food preservatives, chocolate).   Environmental factors (weather changes, bright lights, odors, smoke).  Other causes of headaches include minor injuries to the head. Arthritis in the neck; problems with the jaw, eyes, ears, or nose are also causes of headaches. Allergies, drugs, alcohol, and exposure to smoke can also cause moderate headaches. Rebound headaches can occur if someone uses pain medications for a long period of time and then stops. Less commonly, blood vessel problems in the neck and brain (including stroke) can cause various types of headache. Treatment of headaches includes medicines for pain and relaxation. Ice packs or heat applied to the back of the head and neck help some people. Massaging the shoulders, neck and scalp are often very useful. Relaxation techniques and stretching can help prevent these headaches. Avoid alcohol and cigarette smoking as these tend to make headaches  worse. Please see your caregiver if your headache is not better in 2 days.  SEEK IMMEDIATE MEDICAL CARE IF:   You develop a high fever, chills, or repeated vomiting.   You faint or have difficulty with vision.   You develop unusual numbness or weakness of your arms or legs.   Relief of pain is inadequate with medication, or you develop severe pain.   You develop confusion, or neck stiffness.   You have a worsening of a headache or do not obtain relief.  Document Released: 03/09/2005 Document Revised: 02/26/2011 Document Reviewed: 09/02/2006 [employer] Patient Information 2012 [employer]. 

## 2011-06-24 ENCOUNTER — Other Ambulatory Visit: Payer: Self-pay | Admitting: *Deleted

## 2011-06-27 ENCOUNTER — Inpatient Hospital Stay (HOSPITAL_COMMUNITY)
Admission: AD | Admit: 2011-06-27 | Discharge: 2011-06-27 | Disposition: A | Discharge status: Hospice | Payer: Medicaid Other | Source: Ambulatory Visit | Attending: Obstetrics & Gynecology | Admitting: Obstetrics & Gynecology

## 2011-06-27 ENCOUNTER — Encounter (HOSPITAL_COMMUNITY): Payer: Self-pay | Admitting: *Deleted

## 2011-06-27 DIAGNOSIS — Z21 Asymptomatic human immunodeficiency virus [HIV] infection status: Secondary | ICD-10-CM | POA: Insufficient documentation

## 2011-06-27 DIAGNOSIS — J029 Acute pharyngitis, unspecified: Secondary | ICD-10-CM | POA: Insufficient documentation

## 2011-06-27 DIAGNOSIS — O99891 Other specified diseases and conditions complicating pregnancy: Secondary | ICD-10-CM | POA: Insufficient documentation

## 2011-06-27 DIAGNOSIS — B999 Unspecified infectious disease: Secondary | ICD-10-CM

## 2011-06-27 DIAGNOSIS — IMO0002 Reserved for concepts with insufficient information to code with codable children: Secondary | ICD-10-CM

## 2011-06-27 DIAGNOSIS — J039 Acute tonsillitis, unspecified: Secondary | ICD-10-CM

## 2011-06-27 DIAGNOSIS — O98919 Unspecified maternal infectious and parasitic disease complicating pregnancy, unspecified trimester: Secondary | ICD-10-CM

## 2011-06-27 DIAGNOSIS — O98519 Other viral diseases complicating pregnancy, unspecified trimester: Secondary | ICD-10-CM | POA: Insufficient documentation

## 2011-06-27 LAB — URINALYSIS, ROUTINE W REFLEX MICROSCOPIC
Glucose, UA: NEGATIVE mg/dL
Hgb urine dipstick: NEGATIVE
Leukocytes, UA: NEGATIVE
Protein, ur: 30 mg/dL — AB
pH: 6 (ref 5.0–8.0)

## 2011-06-27 LAB — URINE MICROSCOPIC-ADD ON

## 2011-06-27 MED ORDER — PENICILLIN V POTASSIUM 500 MG PO TABS: 500.0000 mg | ORAL_TABLET | Freq: Four times a day (QID) | ORAL | Status: AC

## 2011-06-27 MED ORDER — HYDROCODONE-ACETAMINOPHEN 5-325 MG PO TABS: 1.0000 | ORAL_TABLET | Freq: Four times a day (QID) | ORAL | Status: AC | PRN

## 2011-06-27 NOTE — MAU Note (Signed)
Sore throat, lateral neck pain,  Headache, and fever since yesterday morning. No N&V&D

## 2011-06-27 NOTE — MAU Provider Note (Signed)
History     CSN: [id number]  Arrival date & time 06/27/11  1219   None     Chief Complaint  Patient presents with  . Sore Throat  . Torticollis   HPI [name] is a 26 y.o. female @ [redacted]w[redacted]d gestation who presents to MAU for sore throat, fever, swollen glands and nausea that started yesterday. The history was provided by the patient.  Past Medical History  Diagnosis Date  . Depression   . HIV positive     Past Surgical History  Procedure Date  . Laparoscopy 01/06/2011    Procedure: LAPAROSCOPY OPERATIVE;  Surgeon: Roseanna Rainbow, MD;  Location: [facility];  Service: Gynecology;  Laterality: N/A;  . Laparotomy 01/06/2011    Procedure: LAPAROTOMY;  Surgeon: Roseanna Rainbow, MD;  Location: [facility];  Service: Gynecology;  Laterality: N/A;  . Dilation and curettage of uterus   . Salpingoophorectomy     Family History  Problem Relation Age of Onset  . Diabetes Mother   . Hypertension Mother     History  Substance Use Topics  . Smoking status: Former Smoker    Types: Cigarettes  . Smokeless tobacco: Never Used  . Alcohol Use: No     beer    OB History    Grav Para Term Preterm Abortions TAB SAB Ect Mult Living   5 2 2  0 2 0 1 1 0 2      Review of Systems  Constitutional: Positive for fever, chills and appetite change.  HENT: Positive for congestion, sore throat and neck pain.   Respiratory: Negative for cough and wheezing.   Cardiovascular: Negative.   Gastrointestinal: Positive for nausea. Negative for abdominal pain and diarrhea.  Genitourinary: Negative for dysuria, urgency and frequency.  Musculoskeletal: Positive for back pain.  Skin: Negative.   Neurological: Positive for headaches. Negative for dizziness.  Psychiatric/Behavioral: Negative for confusion and agitation.    Allergies  Review of patient's allergies indicates no known allergies.  Home Medications  No current outpatient prescriptions on file.  BP 114/72  Pulse 128  Temp(Src)  100.1 F (37.8 C) (Oral)  Resp 20  Ht 5\' 4"  (1.626 m)  Wt 179 lb (81.194 kg)  BMI 30.73 kg/m2  LMP 02/16/2011  Physical Exam  Nursing note and vitals reviewed. Constitutional: She is oriented to person, place, and time. She appears well-developed and well-nourished. No distress.  HENT:  Head: Normocephalic.  Right Ear: Tympanic membrane, external ear and ear canal normal.  Left Ear: Tympanic membrane, external ear and ear canal normal.  Mouth/Throat: Uvula is midline and mucous membranes are normal. Posterior oropharyngeal erythema present.       Tonsils enlarged with erythema.   Eyes: EOM are normal.  Neck: Neck supple.  Cardiovascular:       Tachycardia   Pulmonary/Chest: Effort normal.  Abdominal: Soft. There is no tenderness.       Positive FHT's.  Musculoskeletal: Normal range of motion.  Lymphadenopathy:    She has cervical adenopathy.  Neurological: She is alert and oriented to person, place, and time. No cranial nerve deficit.  Skin: Skin is warm and dry.  Psychiatric: She has a normal mood and affect. Her behavior is normal. Judgment and thought content normal.   Assessment: Pharyngitis   Tonsillitis   Second trimester pregnancy   HIV  Plan:   Discussed with Dr. Gaynell Face   Rx Pen VK 500 mg po qid x 10 days   Hydrocodone Rx  Colraspetic spray   Follow up in the office ED Course  Procedures  MDM

## 2011-06-27 NOTE — Discharge Instructions (Signed)
Take the medication as directed. Follow up in the office. Return here as needed.  Sore Throat Sore throats may be caused by bacteria and viruses. They may also be caused by:  Smoking.   Pollution.   Allergies.  If a sore throat is due to strep infection (a bacterial infection), you may need:  A throat swab.   A culture test to verify the strep infection.  You will need one of these:  An antibiotic shot.   Oral medicine for a full 10 days.  Strep infection is very contagious. A doctor should check any close contacts who have a sore throat or fever. A sore throat caused by a virus infection will usually last only 3-4 days. Antibiotics will not treat a viral sore throat.  Infectious mononucleosis (a viral disease), however, can cause a sore throat that lasts for up to 3 weeks. Mononucleosis can be diagnosed with blood tests. You must have been sick for at least 1 week in order for the test to give accurate results. HOME CARE INSTRUCTIONS   To treat a sore throat, take mild pain medicine.   Increase your fluids.   Eat a soft diet.   Do not smoke.   Gargling with warm water or salt water (1 tsp. salt in 8 oz. water) can be helpful.   Try throat sprays or lozenges or sucking on hard candy to ease the symptoms.  Call your doctor if your sore throat lasts longer than 1 week.  SEEK IMMEDIATE MEDICAL CARE IF:  You have difficulty breathing.   You have increased swelling in the throat.   You have pain so severe that you are unable to swallow fluids or your saliva.   You have a severe headache, a high fever, vomiting, or a red rash.  Document Released: 04/16/2004 Document Revised: 02/26/2011 Document Reviewed: 02/24/2007 [facility] Patient Information 2012 [place], [place].Tonsillitis Tonsils are lumps of lymphoid tissues at the back of the throat. Each tonsil has 20 crevices (crypts). Tonsils help fight nose and throat infections and keep infection from spreading to other parts of  the body for the first 18 months of life. Tonsillitis is an infection of the throat that causes the tonsils to become red, tender, and swollen. CAUSES Sudden and, if treated, temporary (acute) tonsillitis is usually caused by infection with streptococcal bacteria. Long lasting (chronic) tonsillitis occurs when the crypts of the tonsils become filled with pieces of food and bacteria, which makes it easy for the tonsils to become constantly infected. SYMPTOMS  Symptoms of tonsillitis include:  A sore throat.   White patches on the tonsils.   Fever.   Tiredness.  DIAGNOSIS Tonsillitis can be diagnosed through a physical exam. Diagnosis can be confirmed with the results of lab tests, including a throat culture. TREATMENT  The goals of tonsillitis treatment include the reduction of the severity and duration of symptoms, prevention of associated conditions, and prevention of disease transmission. Tonsillitis caused by bacteria can be treated with antibiotics. Usually, treatment with antibiotics is started before the cause of the tonsillitis is known. However, if it is determined that the cause is not bacterial, antibiotics will not treat the tonsillitis. If attacks of tonsillitis are severe and frequent, your caregiver may recommend surgery to remove the tonsils (tonsillectomy). HOME CARE INSTRUCTIONS   Rest as much as possible and get plenty of sleep.   Drink plenty of fluids. While the throat is very sore, eat soft foods or liquids, such as sherbet, soups, or instant breakfast  drinks.   Eat frozen ice pops.   Older children and adults may gargle with a warm or cold liquid to help soothe the throat. Mix 1 teaspoon of salt in 1 cup of water.   Other family members who also develop a sore throat or fever should have a medical exam or throat culture.   Only take over-the-counter or prescription medicines for pain, discomfort, or fever as directed by your caregiver.   If you are given  antibiotics, take them as directed. Finish them even if you start to feel better.  SEEK MEDICAL CARE IF:   Your baby is older than 3 months with a rectal temperature of 100.5 F (38.1 C) or higher for more than 1 day.   Large, tender lumps develop in your neck.   A rash develops.   Green, yellow-brown, or bloody substance is coughed up.   You are unable to swallow liquids or food for 24 hours.   Your child is unable to swallow food or liquids for 12 hours.  SEEK IMMEDIATE MEDICAL CARE IF:   You develop any new symptoms such as vomiting, severe headache, stiff neck, chest pain, or trouble breathing or swallowing.   You have severe throat pain along with drooling or voice changes.   You have severe pain, unrelieved with recommended medications.   You are unable to fully open the mouth.   You develop redness, swelling, or severe pain anywhere in the neck.   You have a fever.   Your baby is older than 3 months with a rectal temperature of 102 F (38.9 C) or higher.   Your baby is 49 months old or younger with a rectal temperature of 100.4 F (38 C) or higher.  MAKE SURE YOU:   Understand these instructions.   Will watch your condition.   Will get help right away if you are not doing well or get worse.  Document Released: 12/17/2004 Document Revised: 02/26/2011 Document Reviewed: 05/15/2010 [facility] Patient Information 2012 [place], [place].Tonsillitis Tonsils are lumps of lymphoid tissues at the back of the throat. Each tonsil has 20 crevices (crypts). Tonsils help fight nose and throat infections and keep infection from spreading to other parts of the body for the first 18 months of life. Tonsillitis is an infection of the throat that causes the tonsils to become red, tender, and swollen. CAUSES Sudden and, if treated, temporary (acute) tonsillitis is usually caused by infection with streptococcal bacteria. Long lasting (chronic) tonsillitis occurs when the crypts of the  tonsils become filled with pieces of food and bacteria, which makes it easy for the tonsils to become constantly infected. SYMPTOMS  Symptoms of tonsillitis include:  A sore throat.   White patches on the tonsils.   Fever.   Tiredness.  DIAGNOSIS Tonsillitis can be diagnosed through a physical exam. Diagnosis can be confirmed with the results of lab tests, including a throat culture. TREATMENT  The goals of tonsillitis treatment include the reduction of the severity and duration of symptoms, prevention of associated conditions, and prevention of disease transmission. Tonsillitis caused by bacteria can be treated with antibiotics. Usually, treatment with antibiotics is started before the cause of the tonsillitis is known. However, if it is determined that the cause is not bacterial, antibiotics will not treat the tonsillitis. If attacks of tonsillitis are severe and frequent, your caregiver may recommend surgery to remove the tonsils (tonsillectomy). HOME CARE INSTRUCTIONS   Rest as much as possible and get plenty of sleep.   Drink  plenty of fluids. While the throat is very sore, eat soft foods or liquids, such as sherbet, soups, or instant breakfast drinks.   Eat frozen ice pops.   Older children and adults may gargle with a warm or cold liquid to help soothe the throat. Mix 1 teaspoon of salt in 1 cup of water.   Other family members who also develop a sore throat or fever should have a medical exam or throat culture.   Only take over-the-counter or prescription medicines for pain, discomfort, or fever as directed by your caregiver.   If you are given antibiotics, take them as directed. Finish them even if you start to feel better.  SEEK MEDICAL CARE IF:   Your baby is older than 3 months with a rectal temperature of 100.5 F (38.1 C) or higher for more than 1 day.   Large, tender lumps develop in your neck.   A rash develops.   Green, yellow-brown, or bloody substance is  coughed up.   You are unable to swallow liquids or food for 24 hours.   Your child is unable to swallow food or liquids for 12 hours.  SEEK IMMEDIATE MEDICAL CARE IF:   You develop any new symptoms such as vomiting, severe headache, stiff neck, chest pain, or trouble breathing or swallowing.   You have severe throat pain along with drooling or voice changes.   You have severe pain, unrelieved with recommended medications.   You are unable to fully open the mouth.   You develop redness, swelling, or severe pain anywhere in the neck.   You have a fever.   Your baby is older than 3 months with a rectal temperature of 102 F (38.9 C) or higher.   Your baby is 73 months old or younger with a rectal temperature of 100.4 F (38 C) or higher.  MAKE SURE YOU:   Understand these instructions.   Will watch your condition.   Will get help right away if you are not doing well or get worse.  Document Released: 12/17/2004 Document Revised: 02/26/2011 Document Reviewed: 05/15/2010 [facility] Patient Information 2012 [place], [place].

## 2011-06-27 NOTE — MAU Note (Signed)
Pt reports having back neck and throat pain. Having headache and mild abd pain. Having nasal congestion  And chills. Symptoms started yesterday.

## 2011-06-30 ENCOUNTER — Inpatient Hospital Stay (HOSPITAL_COMMUNITY)
Admission: AD | Admit: 2011-06-30 | Discharge: 2011-06-30 | Disposition: A | Discharge status: Home / Self Care | Payer: Medicaid Other | Source: Ambulatory Visit | Attending: Obstetrics | Admitting: Obstetrics

## 2011-06-30 ENCOUNTER — Encounter (HOSPITAL_COMMUNITY): Payer: Self-pay | Admitting: *Deleted

## 2011-06-30 ENCOUNTER — Inpatient Hospital Stay (HOSPITAL_COMMUNITY)
Admit: 2011-06-30 | Discharge: 2011-06-30 | Disposition: A | Discharge status: Home / Self Care | Payer: Medicaid Other | Attending: Obstetrics | Admitting: Obstetrics

## 2011-06-30 DIAGNOSIS — O26899 Other specified pregnancy related conditions, unspecified trimester: Secondary | ICD-10-CM

## 2011-06-30 DIAGNOSIS — R109 Unspecified abdominal pain: Secondary | ICD-10-CM

## 2011-06-30 DIAGNOSIS — O99891 Other specified diseases and conditions complicating pregnancy: Secondary | ICD-10-CM | POA: Insufficient documentation

## 2011-06-30 LAB — URINALYSIS, ROUTINE W REFLEX MICROSCOPIC
Bilirubin Urine: NEGATIVE
Ketones, ur: 15 mg/dL — AB
Leukocytes, UA: NEGATIVE
Nitrite: NEGATIVE
Protein, ur: NEGATIVE mg/dL
Urobilinogen, UA: 1 mg/dL (ref 0.0–1.0)

## 2011-06-30 LAB — DIFFERENTIAL
Basophils Absolute: 0 10*3/uL (ref 0.0–0.1)
Basophils Relative: 0 % (ref 0–1)
Blasts: 0 %
Lymphocytes Relative: 35 % (ref 12–46)
Myelocytes: 0 %
Neutro Abs: 3.7 10*3/uL (ref 1.7–7.7)
Neutrophils Relative %: 59 % (ref 43–77)
Promyelocytes Absolute: 0 %

## 2011-06-30 LAB — CBC
Hemoglobin: 10.3 g/dL — ABNORMAL LOW (ref 12.0–15.0)
MCH: 30.2 pg (ref 26.0–34.0)
MCHC: 33 g/dL (ref 30.0–36.0)
RDW: 13.5 % (ref 11.5–15.5)

## 2011-06-30 MED ORDER — GI COCKTAIL ~~LOC~~
30.0000 mL | ORAL | Status: AC
  Administered 2011-06-30: 30 mL via ORAL
  Filled 2011-06-30: qty 30

## 2011-06-30 MED ORDER — FAMOTIDINE 20 MG PO TABS
20.0000 mg | ORAL_TABLET | ORAL | Status: AC
  Administered 2011-06-30: 20 mg via ORAL
  Filled 2011-06-30: qty 1

## 2011-06-30 MED ORDER — OMEPRAZOLE 20 MG PO CPDR: 20.0000 mg | DELAYED_RELEASE_CAPSULE | Freq: Every day | ORAL | Status: DC

## 2011-06-30 MED ORDER — SODIUM CHLORIDE 0.9 % IJ SOLN
INTRAMUSCULAR | Status: AC
  Filled 2011-06-30: qty 3

## 2011-06-30 NOTE — MAU Provider Note (Signed)
History     CSN: [id number]  Arrival date and time: 06/30/11 1191   First Provider Initiated Contact with Patient 06/30/11 [phone]      Chief Complaint  Patient presents with  . Abdominal Pain   Abdominal Pain Pertinent negatives include no constipation, diarrhea, dysuria, fever, frequency, headaches, hematuria, melena, nausea or weight loss.    [name] is a 26 y.o. pregnant female 17wks2d presenting the MAU for 1 day history of aching mid abdominal pain with occasional stabbing pain.  States pain began after she ate two hot dogs last night.  Reports eating exacerbates symptoms, nothing relieves symptoms.  Last normal BM today.  Denies urinary symptoms, fever, vaginal bleeding or discharge.  Has been taking  PCN since 4/6 for tonsillitis.   OB History    Grav Para Term Preterm Abortions TAB SAB Ect Mult Living   5 2 1 1 2  0 1 1 0 2      Past Medical History  Diagnosis Date  . Depression   . HIV positive   . Asthma     Past Surgical History  Procedure Date  . Laparoscopy 01/06/2011    Procedure: LAPAROSCOPY OPERATIVE;  Surgeon: Roseanna Rainbow, MD;  Location: [facility];  Service: Gynecology;  Laterality: N/A;  . Laparotomy 01/06/2011    Procedure: LAPAROTOMY;  Surgeon: Roseanna Rainbow, MD;  Location: [facility];  Service: Gynecology;  Laterality: N/A;  . Dilation and curettage of uterus   . Salpingoophorectomy     Family History  Problem Relation Age of Onset  . Diabetes Mother   . Hypertension Mother   . Hyperlipidemia Mother   . Diabetes Brother   . Diabetes Maternal Aunt   . Hypertension Maternal Aunt     History  Substance Use Topics  . Smoking status: Former Smoker -- 0.5 packs/day for 7 years    Types: Cigarettes    Quit date: 03/29/2011  . Smokeless tobacco: Never Used  . Alcohol Use: No     beer last year    Allergies: No Known Allergies  Prescriptions prior to admission  Medication Sig Dispense Refill  . acetaminophen (TYLENOL) 500 MG  tablet Take 500 mg by mouth every 6 (six) hours as needed. For pain or headache      . HYDROcodone-acetaminophen (NORCO) 5-325 MG per tablet Take 1 tablet by mouth every 6 (six) hours as needed for pain.  20 tablet  0  . lamiVUDine-zidovudine (COMBIVIR) 150-300 MG per tablet Take 1 tablet by mouth 2 (two) times daily.      Marland Kitchen lopinavir-ritonavir (KALETRA) 200-50 MG per tablet Take 2 tablets by mouth 2 (two) times daily.      . penicillin v potassium (VEETID) 500 MG tablet Take 1 tablet (500 mg total) by mouth 4 (four) times daily.  40 tablet  0  . Prenatal Vit-Fe Fumarate-FA (PRENATAL MULTIVITAMIN) TABS Take 1 tablet by mouth at bedtime.        Review of Systems  Constitutional: Negative for fever, chills, weight loss and diaphoresis.  HENT: Positive for congestion and sore throat.   Eyes: Negative for double vision.  Respiratory: Negative for cough, hemoptysis, sputum production, shortness of breath, wheezing and stridor.   Cardiovascular: Negative for chest pain, palpitations, orthopnea, claudication and leg swelling.  Gastrointestinal: Positive for abdominal pain (mid aching/sharp abdominal pain). Negative for heartburn, nausea, diarrhea, constipation, blood in stool and melena.  Genitourinary: Negative for dysuria, urgency, frequency, hematuria and flank pain.  Skin: Negative.  Neurological: Negative for dizziness, tingling, tremors, seizures, loss of consciousness, weakness and headaches.  Psychiatric/Behavioral: Negative for depression, suicidal ideas and substance abuse.   Physical Exam   Blood pressure 122/65, pulse 93, temperature 97.8 F (36.6 C), temperature source Oral, resp. rate 18, last menstrual period 02/16/2011, SpO2 100.00%.  Physical Exam  Constitutional: She is oriented to person, place, and time. She appears well-developed and well-nourished. No distress.  HENT:  Head: Normocephalic and atraumatic.  Eyes: Conjunctivae are normal. Pupils are equal, round, and  reactive to light.  Neck: No JVD present. No tracheal deviation present.  Cardiovascular: Normal rate, regular rhythm and normal heart sounds.   Respiratory: Effort normal and breath sounds normal. No stridor. No respiratory distress. She has no wheezes. She has no rales.  GI: Soft. She exhibits no mass. There is tenderness (mild umbilical and left mid abdominal tenderness ). There is no rebound and no guarding.  Genitourinary: Vagina normal. Cervix exhibits no motion tenderness, no discharge and no friability. No erythema, tenderness or bleeding around the vagina. No foreign body around the vagina. No signs of injury around the vagina. No vaginal discharge found.       Cervix 0/long/-3  Musculoskeletal: Normal range of motion.  Neurological: She is alert and oriented to person, place, and time.  Skin: Skin is warm and dry. She is not diaphoretic.  Psychiatric: She has a normal mood and affect. Her behavior is normal. Judgment and thought content normal.   Pain elicited to palpation 1-2cm above fundus and radiating to left of abdomen.   MAU Course  Procedures CBC MRI of Abdomen UA Assessment and Plan   Results for orders placed during the hospital encounter of 06/30/11 (from the past 24 hour(s))  URINALYSIS, ROUTINE W REFLEX MICROSCOPIC     Status: Abnormal   Collection Time   06/30/11  9:26 AM      Component Value Range   Color, Urine YELLOW  YELLOW    APPearance CLEAR  CLEAR    Specific Gravity, Urine 1.020  1.005 - 1.030    pH 6.0  5.0 - 8.0    Glucose, UA NEGATIVE  NEGATIVE (mg/dL)   Hgb urine dipstick NEGATIVE  NEGATIVE    Bilirubin Urine NEGATIVE  NEGATIVE    Ketones, ur 15 (*) NEGATIVE (mg/dL)   Protein, ur NEGATIVE  NEGATIVE (mg/dL)   Urobilinogen, UA 1.0  0.0 - 1.0 (mg/dL)   Nitrite NEGATIVE  NEGATIVE    Leukocytes, UA NEGATIVE  NEGATIVE   CBC     Status: Abnormal   Collection Time   06/30/11 12:25 PM      Component Value Range   WBC 6.0  4.0 - 10.5 (K/uL)   RBC 3.41  (*) 3.87 - 5.11 (MIL/uL)   Hemoglobin 10.3 (*) 12.0 - 15.0 (g/dL)   HCT 16.1 (*) 09.6 - 46.0 (%)   MCV 91.5  78.0 - 100.0 (fL)   MCH 30.2  26.0 - 34.0 (pg)   MCHC 33.0  30.0 - 36.0 (g/dL)   RDW 04.5  40.9 - 81.1 (%)   Platelets 226  150 - 400 (K/uL)  DIFFERENTIAL     Status: Normal   Collection Time   06/30/11 12:25 PM      Component Value Range   Neutrophils Relative 59  43 - 77 (%)   Lymphocytes Relative 35  12 - 46 (%)   Monocytes Relative 3  3 - 12 (%)   Eosinophils Relative 0  0 - 5 (%)  Basophils Relative 0  0 - 1 (%)   Band Neutrophils 3  0 - 10 (%)   Metamyelocytes Relative 0     Myelocytes 0     Promyelocytes Absolute 0     Blasts 0     nRBC 0  0 (/100 WBC)   Neutro Abs 3.7  1.7 - 7.7 (K/uL)   Lymphs Abs 2.1  0.7 - 4.0 (K/uL)   Monocytes Absolute 0.2  0.1 - 1.0 (K/uL)   Eosinophils Absolute 0.0  0.0 - 0.7 (K/uL)   Basophils Absolute 0.0  0.0 - 0.1 (K/uL)   RBC Morphology POLYCHROMASIA PRESENT     Smear Review LARGE PLATELETS PRESENT      Pepcid 20mg  PO given without relief of symptoms  Consult with Dr. Clearance Coots.  Will try GI cocktail.  Low suspicion of appendicitis but if no relief with GI cocktail will consider abdominal CT.   Patient reported no relief of pain with GI cocktail.   Radiologist recommends MRI since under 20 weeks.   Will transfer to [facility].  IMAGING: Mr Pelvis Wo Contrast  06/30/2011  *RADIOLOGY REPORT*  Clinical Data: 19-week pregnant patient with abdominal tenderness, fever  MRI ABDOMEN WITHOUT CONTRAST,MRI PELVIS WITHOUT CONTRAST  Technique:  Multiplanar multisequence MR imaging of the abdomen was performed. No intravenous contrast was administered.,Technique: Multi-planar multi-sequence MR imaging of the pelvis was performed following the standard protocol. No intravenous contras  Comparison: None.  Findings: The appendix is seen in the right mid abdomen and has a normal caliber. There is a minute amount of fluid within the right  paracolic gutter which is nonspecific.  Both ovaries are seen within the pelvis and have a normal appearance.  The bone marrow signal is normal.  The kidneys, spleen, pancreas have a normal appearance.  There are several small hepatic cysts.  The gallbladder has a normal appearance. The fetus has an unremarkable appearance.  IMPRESSION: Normal MRI of the appendix.  No abnormality is identified within the abdomen or pelvis.  Original Report Authenticated By: Brandon Melnick, M.D.   Mr Abdomen Wo Contrast  06/30/2011  *RADIOLOGY REPORT*  Clinical Data: 19-week pregnant patient with abdominal tenderness, fever  MRI ABDOMEN WITHOUT CONTRAST,MRI PELVIS WITHOUT CONTRAST  Technique:  Multiplanar multisequence MR imaging of the abdomen was performed. No intravenous contrast was administered.,Technique: Multi-planar multi-sequence MR imaging of the pelvis was performed following the standard protocol. No intravenous contras  Comparison: None.  Findings: The appendix is seen in the right mid abdomen and has a normal caliber. There is a minute amount of fluid within the right paracolic gutter which is nonspecific.  Both ovaries are seen within the pelvis and have a normal appearance.  The bone marrow signal is normal.  The kidneys, spleen, pancreas have a normal appearance.  There are several small hepatic cysts.  The gallbladder has a normal appearance. The fetus has an unremarkable appearance.  IMPRESSION: Normal MRI of the appendix.  No abnormality is identified within the abdomen or pelvis.  Original Report Authenticated By: Brandon Melnick, M.D.   A: Abdominal pain in pregnancy of unknown origin  P: Discussed pt MRI results with Dr Clearance Coots. D/C home Omeprazole 20 mg PO QD F/U in office this week Return to MAU as needed   [employer], [employer] Certified Nurse-Midwife

## 2011-06-30 NOTE — MAU Note (Signed)
C/O pain that started last night after she ate 2 hot dogs. Points to mid to upper abdomen. Still hurts after she eats or drinks anything. Comes to MAU for care in between MD appointments.

## 2011-06-30 NOTE — MAU Note (Signed)
[facility] here. Patient now states that she did not know she was going for an MRI. Telling [facility] personnel that she did not know. Clayton Lefort, CNM has discussed all along with patient, her plan of care and course of action. There have been witnesses each time of those conversations. Clayton Lefort, CNM came into room with [facility] and spoke with patient. After several attempts at explaining to patient again, patient still unable to repeat back her plan of care. [facility] explained again to patient, spoke with patient boyfriend. Patient agrees to travel to [facility] for MRI.

## 2011-06-30 NOTE — Discharge Instructions (Signed)
Abdominal Pain During Pregnancy  Abdominal discomfort is common in pregnancy. Most of the time, it does not cause harm. There are many causes of abdominal pain. Some causes are more serious than others. Some of the causes of abdominal pain in pregnancy are easily diagnosed. Occasionally, the diagnosis takes time to understand. Other times, the cause is not determined. Abdominal pain can be a sign that something is very wrong with the pregnancy, or the pain may have nothing to do with the pregnancy at all. For this reason, always tell your caregiver if you have any abdominal discomfort.  CAUSES  Common and harmless causes of abdominal pain include:   Constipation.   Excess gas and bloating.   Round ligament pain. This is pain that is felt in the folds of the groin.   The position the baby or placenta is in.   Baby kicks.   Braxton-Hicks contractions. These are mild contractions that do not cause cervical dilation.  Serious causes of abdominal pain include:   Ectopic pregnancy. This happens when a fertilized egg implants outside of the uterus.   Miscarriage.   Preterm labor. This is when labor starts at less than 37 weeks of pregnancy.   Placental abruption. This is when the placenta partially or completely separates from the uterus.   Preeclampsia. This is often associated with high blood pressure and has been referred to as "toxemia in pregnancy."   Uterine or amniotic fluid infections.  Causes unrelated to pregnancy include:   Urinary tract infection.   Gallbladder stones or inflammation.   Hepatitis or other liver illness.   Intestinal problems, stomach flu, food poisoning, or ulcer.   Appendicitis.   Kidney (renal) stones.   Kidney infection (pylonephritis).  HOME CARE INSTRUCTIONS   For mild pain:   Do not have sexual intercourse or put anything in your vagina until your symptoms go away completely.   Get plenty of rest until your pain improves. If your pain does not improve in 1 hour, call  your caregiver.   Drink clear fluids if you feel nauseous. Avoid solid food as long as you are uncomfortable or nauseous.   Only take medicine as directed by your caregiver.   Keep all follow-up appointments with your caregiver.  SEEK IMMEDIATE MEDICAL CARE IF:   You are bleeding, leaking fluid, or passing tissue from the vagina.   You have increasing pain or cramping.   You have persistent vomiting.   You have painful or bloody urination.   You have a fever.   You notice a decrease in your baby's movements.   You have extreme weakness or feel faint.   You have shortness of breath, with or without abdominal pain.   You develop a severe headache with abdominal pain.   You have abnormal vaginal discharge with abdominal pain.   You have persistent diarrhea.   You have abdominal pain that continues even after rest, or gets worse.  MAKE SURE YOU:    Understand these instructions.   Will watch your condition.   Will get help right away if you are not doing well or get worse.  Document Released: 03/09/2005 Document Revised: 02/26/2011 Document Reviewed: 10/03/2010  [employer] Patient Information 2012 [employer].

## 2011-06-30 NOTE — MAU Note (Signed)
Patient transferred via [facility] for MRI

## 2011-07-14 ENCOUNTER — Ambulatory Visit: Payer: Medicaid Other | Admitting: Internal Medicine

## 2011-07-15 ENCOUNTER — Other Ambulatory Visit: Payer: Medicaid Other

## 2011-07-15 DIAGNOSIS — B20 Human immunodeficiency virus [HIV] disease: Secondary | ICD-10-CM

## 2011-07-15 LAB — COMPLETE METABOLIC PANEL WITH GFR
ALT: 8 U/L (ref 0–35)
AST: 13 U/L (ref 0–37)
Albumin: 3.7 g/dL (ref 3.5–5.2)
Alkaline Phosphatase: 29 U/L — ABNORMAL LOW (ref 39–117)
Potassium: 4.2 mEq/L (ref 3.5–5.3)
Sodium: 137 mEq/L (ref 135–145)
Total Protein: 6.7 g/dL (ref 6.0–8.3)

## 2011-07-15 LAB — CBC WITH DIFFERENTIAL/PLATELET
Basophils Absolute: 0 10*3/uL (ref 0.0–0.1)
Basophils Relative: 0 % (ref 0–1)
Hemoglobin: 11.1 g/dL — ABNORMAL LOW (ref 12.0–15.0)
Lymphocytes Relative: 21 % (ref 12–46)
MCHC: 33 g/dL (ref 30.0–36.0)
Neutro Abs: 6.1 10*3/uL (ref 1.7–7.7)
Neutrophils Relative %: 73 % (ref 43–77)
RDW: 13.3 % (ref 11.5–15.5)
WBC: 8.3 10*3/uL (ref 4.0–10.5)

## 2011-07-17 LAB — HIV-1 RNA ULTRAQUANT REFLEX TO GENTYP+
HIV 1 RNA Quant: <20 copies/mL (ref <20)
HIV-1 RNA Quant, Log: <1.3 {Log} (ref <1.30)

## 2011-07-30 ENCOUNTER — Telehealth: Payer: Self-pay | Admitting: *Deleted

## 2011-07-30 ENCOUNTER — Ambulatory Visit: Payer: Medicaid Other | Admitting: Internal Medicine

## 2011-07-30 NOTE — Telephone Encounter (Signed)
Called patient to remind her of the appt today and she advised her daughter has an appt this morning and she will not be able to make hers. Transferred her to the front to reschedule the appt for a later date. Reminded her that because she is pregnant she really needs to keep her appts. She verbalized that she understood.

## 2011-08-20 ENCOUNTER — Ambulatory Visit: Payer: Medicaid Other | Admitting: Internal Medicine

## 2011-08-29 ENCOUNTER — Inpatient Hospital Stay (HOSPITAL_COMMUNITY)
Admission: AD | Admit: 2011-08-29 | Discharge: 2011-08-29 | Disposition: A | Discharge status: Home / Self Care | Payer: Medicaid Other | Source: Ambulatory Visit | Attending: Obstetrics & Gynecology | Admitting: Obstetrics & Gynecology

## 2011-08-29 ENCOUNTER — Encounter (HOSPITAL_COMMUNITY): Payer: Self-pay | Admitting: Obstetrics and Gynecology

## 2011-08-29 DIAGNOSIS — O4700 False labor before 37 completed weeks of gestation, unspecified trimester: Secondary | ICD-10-CM | POA: Insufficient documentation

## 2011-08-29 DIAGNOSIS — O479 False labor, unspecified: Secondary | ICD-10-CM

## 2011-08-29 DIAGNOSIS — B9689 Other specified bacterial agents as the cause of diseases classified elsewhere: Secondary | ICD-10-CM | POA: Insufficient documentation

## 2011-08-29 DIAGNOSIS — A499 Bacterial infection, unspecified: Secondary | ICD-10-CM | POA: Insufficient documentation

## 2011-08-29 DIAGNOSIS — N760 Acute vaginitis: Secondary | ICD-10-CM | POA: Insufficient documentation

## 2011-08-29 DIAGNOSIS — R109 Unspecified abdominal pain: Secondary | ICD-10-CM | POA: Insufficient documentation

## 2011-08-29 DIAGNOSIS — N949 Unspecified condition associated with female genital organs and menstrual cycle: Secondary | ICD-10-CM | POA: Insufficient documentation

## 2011-08-29 DIAGNOSIS — O2390 Unspecified genitourinary tract infection in pregnancy, unspecified trimester: Secondary | ICD-10-CM | POA: Insufficient documentation

## 2011-08-29 LAB — WET PREP, GENITAL: Yeast Wet Prep HPF POC: NONE SEEN

## 2011-08-29 LAB — URINE MICROSCOPIC-ADD ON

## 2011-08-29 LAB — URINALYSIS, ROUTINE W REFLEX MICROSCOPIC
Glucose, UA: NEGATIVE mg/dL
Hgb urine dipstick: NEGATIVE
Specific Gravity, Urine: 1.02 (ref 1.005–1.030)
Urobilinogen, UA: 0.2 mg/dL (ref 0.0–1.0)
pH: 7 (ref 5.0–8.0)

## 2011-08-29 MED ORDER — TERBUTALINE SULFATE 1 MG/ML IJ SOLN
0.2500 mg | Freq: Once | INTRAMUSCULAR | Status: AC
  Administered 2011-08-29: 0.25 mg via SUBCUTANEOUS
  Filled 2011-08-29: qty 1

## 2011-08-29 MED ORDER — METRONIDAZOLE 500 MG PO TABS: 500.0000 mg | ORAL_TABLET | Freq: Two times a day (BID) | ORAL | Status: AC

## 2011-08-29 NOTE — MAU Note (Signed)
"  I have been hurting since my mucous plug broke last night.  The pain got worser today."

## 2011-08-29 NOTE — MAU Note (Signed)
Lost her mucus plug having abdominal pain and right side pain, patient is 25 weeks.

## 2011-08-29 NOTE — Progress Notes (Signed)
Pt sleeping , contractions or UI noted at this time.children out of room asking for snacks. Pt woke

## 2011-08-29 NOTE — MAU Provider Note (Signed)
Chief Complaint:  Vaginal Discharge and Abdominal Pain   First Provider Initiated Contact with Patient 08/29/11 2021      HPI  [name] is [name] 26 y.o. [id number] at [redacted]w[redacted]d presenting with diffuse abd pain and tightening this evening and increased vaginal discharge.  Denies contractions, leakage of fluid or vaginal bleeding. Good fetal movement. Hx of SROM at 28 weeks w/ prior pregnancy. Last IC 1-2 days ago.    Past Medical History: Past Medical History  Diagnosis Date  . HIV positive   . Asthma   . Depression     Postpartum after 1st pregnancy    Past Surgical History: Past Surgical History  Procedure Date  . Laparoscopy 01/06/2011    Procedure: LAPAROSCOPY OPERATIVE;  Surgeon: Mary Rainbow, MD;  Location: [facility];  Service: Gynecology;  Laterality: N/[name];  . Laparotomy 01/06/2011    Procedure: LAPAROTOMY;  Surgeon: Mary Rainbow, MD;  Location: [facility];  Service: Gynecology;  Laterality: N/[name];  . Dilation and curettage of uterus   . Salpingoophorectomy     Family History: Family History  Problem Relation Age of Onset  . Diabetes Mother   . Hypertension Mother   . Hyperlipidemia Mother   . Diabetes Brother   . Diabetes Maternal Aunt   . Hypertension Maternal Aunt     Social History: History  Substance Use Topics  . Smoking status: Former Smoker -- 0.5 packs/day for 7 years    Types: Cigarettes    Quit date: 03/29/2011  . Smokeless tobacco: Never Used  . Alcohol Use: No     beer last year    Allergies: No Known Allergies  Meds:  Prescriptions prior to admission  Medication Sig Dispense Refill  . acetaminophen (TYLENOL) 500 MG tablet Take 500 mg by mouth every 6 (six) hours as needed. For pain or headache      . lamiVUDine-zidovudine (COMBIVIR) 150-300 MG per tablet Take 1 tablet by mouth 2 (two) times daily.      Marland Kitchen lopinavir-ritonavir (KALETRA) 200-50 MG per tablet Take 2 tablets by mouth 2 (two) times daily.      Marland Kitchen omeprazole (PRILOSEC) 20 MG  capsule Take 1 capsule (20 mg total) by mouth daily. Generic Ok  Attending: Coral Ceo, MD  30 capsule  0  . Prenatal Vit-Fe Fumarate-FA (PRENATAL MULTIVITAMIN) TABS Take 1 tablet by mouth at bedtime.      . promethazine (PHENERGAN) 25 MG tablet Take 25 mg by mouth Once daily as needed. For nausea          Physical Exam  Blood pressure 114/64, pulse 90, temperature 97.8 F (36.6 C), temperature source Oral, resp. rate 16, height 5\' 2"  (1.575 m), weight 88.089 kg (194 lb 3.2 oz), last menstrual period 02/16/2011, unknown if currently breastfeeding. GENERAL: Well-developed, well-nourished female in no acute distress, sleeping.  HEENT: normocephalic HEART: normal rate RESP: normal effort ABDOMEN: Soft, nontender, nondistended, gravid.  EXTREMITIES: Nontender, no edema NEURO: alert and oriented  SPECULUM EXAM: Small amount of malodorous discharge. Neg pooling. Cervix non-friable.  Dilation: Fingertip Effacement (%): Thick Cervical Position: Posterior Station: Ballotable Presentation: Undeterminable Exam by:: [name], CNM  FHT:  Baseline 140 , moderate variability, accelerations present, no decelerations Contractions: Periods of painless UC's q 3-4 mins and UI.    Labs: Results for orders placed during the hospital encounter of 08/29/11 (from the past 24 hour(s))  URINALYSIS, ROUTINE W REFLEX MICROSCOPIC     Status: Abnormal   Collection Time   08/29/11  6:30 PM      Component Value Range   Color, Urine YELLOW  YELLOW    APPearance CLEAR  CLEAR    Specific Gravity, Urine 1.020  1.005 - 1.030    pH 7.0  5.0 - 8.0    Glucose, UA NEGATIVE  NEGATIVE (mg/dL)   Hgb urine dipstick NEGATIVE  NEGATIVE    Bilirubin Urine NEGATIVE  NEGATIVE    Ketones, ur NEGATIVE  NEGATIVE (mg/dL)   Protein, ur NEGATIVE  NEGATIVE (mg/dL)   Urobilinogen, UA 0.2  0.0 - 1.0 (mg/dL)   Nitrite NEGATIVE  NEGATIVE    Leukocytes, UA SMALL (*) NEGATIVE   URINE MICROSCOPIC-ADD ON     Status: Abnormal    Collection Time   08/29/11  6:30 PM      Component Value Range   Squamous Epithelial / LPF FEW (*) RARE    WBC, UA 3-6  <3 (WBC/hpf)   RBC / HPF 3-6  <3 (RBC/hpf)   Bacteria, UA FEW (*) RARE   WET PREP, GENITAL     Status: Abnormal   Collection Time   08/29/11  8:36 PM      Component Value Range   Yeast Wet Prep HPF POC NONE SEEN  NONE SEEN    Trich, Wet Prep NONE SEEN  NONE SEEN    Clue Cells Wet Prep HPF POC FEW (*) NONE SEEN    WBC, Wet Prep HPF POC MODERATE (*) NONE SEEN    Imaging:  NA  UC's and pain resolved w/ Terbutaline and PO Fluids.  Assessment: 1. Preterm contractions   2. BV (bacterial vaginosis)    Plan: D/C home Follow-up Information    Follow up with Mary Char A, MD. (as scheduled or MAU as needed if symptoms worsen)    Contact information:   [address] [place] [address] [phone]         Medication List  As of 08/29/2011 10:15 PM   START taking these medications         metroNIDAZOLE 500 MG tablet   Commonly known as: FLAGYL   Take 1 tablet (500 mg total) by mouth 2 (two) times daily.         CONTINUE taking these medications         acetaminophen 500 MG tablet   Commonly known as: TYLENOL      lamiVUDine-zidovudine 150-300 MG per tablet   Commonly known as: COMBIVIR      lopinavir-ritonavir 200-50 MG per tablet   Commonly known as: KALETRA      omeprazole 20 MG capsule   Commonly known as: PRILOSEC   Take 1 capsule (20 mg total) by mouth daily. Generic Ok   Attending: Coral Ceo, MD      prenatal multivitamin Tabs      promethazine 25 MG tablet   Commonly known as: PHENERGAN          Where to get your medications    These are the prescriptions that you need to pick up.   You may get these medications from any pharmacy.         metroNIDAZOLE 500 MG tablet          GC/CT pending PTL precautions. Increase fluids and rest  [name] 6/8/201310:10 PM

## 2011-08-29 NOTE — Discharge Instructions (Signed)
Bacterial Vaginosis Bacterial vaginosis (BV) is a vaginal infection where the normal balance of bacteria in the vagina is disrupted. The normal balance is then replaced by an overgrowth of certain bacteria. There are several different kinds of bacteria that can cause BV. BV is the most common vaginal infection in women of childbearing age. CAUSES   The cause of BV is not fully understood. BV develops when there is an increase or imbalance of harmful bacteria.   Some activities or behaviors can upset the normal balance of bacteria in the vagina and put women at increased risk including:   Having a new sex partner or multiple sex partners.   Douching.   Using an intrauterine device (IUD) for contraception.   It is not clear what role sexual activity plays in the development of BV. However, women that have never had sexual intercourse are rarely infected with BV.  Women do not get BV from toilet seats, bedding, swimming pools or from touching objects around them.  SYMPTOMS   Grey vaginal discharge.   A fish-like odor with discharge, especially after sexual intercourse.   Itching or burning of the vagina and vulva.   Burning or pain with urination.   Some women have no signs or symptoms at all.  DIAGNOSIS  Your caregiver must examine the vagina for signs of BV. Your caregiver will perform lab tests and look at the sample of vaginal fluid through a microscope. They will look for bacteria and abnormal cells (clue cells), a pH test higher than 4.5, and a positive amine test all associated with BV.  RISKS AND COMPLICATIONS   Pelvic inflammatory disease (PID).   Infections following gynecology surgery.   Developing HIV.   Developing herpes virus.  TREATMENT  Sometimes BV will clear up without treatment. However, all women with symptoms of BV should be treated to avoid complications, especially if gynecology surgery is planned. Female partners generally do not need to be treated. However,  BV may spread between female sex partners so treatment is helpful in preventing a recurrence of BV.   BV may be treated with antibiotics. The antibiotics come in either pill or vaginal cream forms. Either can be used with nonpregnant or pregnant women, but the recommended dosages differ. These antibiotics are not harmful to the baby.   BV can recur after treatment. If this happens, a second round of antibiotics will often be prescribed.   Treatment is important for pregnant women. If not treated, BV can cause a premature delivery, especially for a pregnant woman who had a premature birth in the past. All pregnant women who have symptoms of BV should be checked and treated.   For chronic reoccurrence of BV, treatment with a type of prescribed gel vaginally twice a week is helpful.  HOME CARE INSTRUCTIONS   Finish all medication as directed by your caregiver.   Do not have sex until treatment is completed.   Tell your sexual partner that you have a vaginal infection. They should see their caregiver and be treated if they have problems, such as a mild rash or itching.   Practice safe sex. Use condoms. Only have 1 sex partner.  PREVENTION  Basic prevention steps can help reduce the risk of upsetting the natural balance of bacteria in the vagina and developing BV:  Do not have sexual intercourse (be abstinent).   Do not douche.   Use all of the medicine prescribed for treatment of BV, even if the signs and symptoms go away.     Tell your sex partner if you have BV. That way, they can be treated, if needed, to prevent reoccurrence.  SEEK MEDICAL CARE IF:   Your symptoms are not improving after 3 days of treatment.   You have increased discharge, pain, or fever.  MAKE SURE YOU:   Understand these instructions.   Will watch your condition.   Will get help right away if you are not doing well or get worse.  FOR MORE INFORMATION  Division of STD Prevention (DSTDP), Centers for Disease  Control and Prevention: www.cdc.gov/std American [employer] Health Association (ASHA): www.ashastd.org  Document Released: 03/09/2005 Document Revised: 02/26/2011 Document Reviewed: 08/30/2008 ExitCare Patient Information 2012 [employer].Bacterial Vaginosis Bacterial vaginosis (BV) is a vaginal infection where the normal balance of bacteria in the vagina is disrupted. The normal balance is then replaced by an overgrowth of certain bacteria. There are several different kinds of bacteria that can cause BV. BV is the most common vaginal infection in women of childbearing age. CAUSES   The cause of BV is not fully understood. BV develops when there is an increase or imbalance of harmful bacteria.   Some activities or behaviors can upset the normal balance of bacteria in the vagina and put women at increased risk including:   Having a new sex partner or multiple sex partners.   Douching.   Using an intrauterine device (IUD) for contraception.   It is not clear what role sexual activity plays in the development of BV. However, women that have never had sexual intercourse are rarely infected with BV.  Women do not get BV from toilet seats, bedding, swimming pools or from touching objects around them.  SYMPTOMS   Grey vaginal discharge.   A fish-like odor with discharge, especially after sexual intercourse.   Itching or burning of the vagina and vulva.   Burning or pain with urination.   Some women have no signs or symptoms at all.  DIAGNOSIS  Your caregiver must examine the vagina for signs of BV. Your caregiver will perform lab tests and look at the sample of vaginal fluid through a microscope. They will look for bacteria and abnormal cells (clue cells), a pH test higher than 4.5, and a positive amine test all associated with BV.  RISKS AND COMPLICATIONS   Pelvic inflammatory disease (PID).   Infections following gynecology surgery.   Developing HIV.   Developing herpes virus.    TREATMENT  Sometimes BV will clear up without treatment. However, all women with symptoms of BV should be treated to avoid complications, especially if gynecology surgery is planned. Female partners generally do not need to be treated. However, BV may spread between female sex partners so treatment is helpful in preventing a recurrence of BV.   BV may be treated with antibiotics. The antibiotics come in either pill or vaginal cream forms. Either can be used with nonpregnant or pregnant women, but the recommended dosages differ. These antibiotics are not harmful to the baby.   BV can recur after treatment. If this happens, a second round of antibiotics will often be prescribed.   Treatment is important for pregnant women. If not treated, BV can cause a premature delivery, especially for a pregnant woman who had a premature birth in the past. All pregnant women who have symptoms of BV should be checked and treated.   For chronic reoccurrence of BV, treatment with a type of prescribed gel vaginally twice a week is helpful.  HOME CARE INSTRUCTIONS   Finish all   medication as directed by your caregiver.   Do not have sex until treatment is completed.   Tell your sexual partner that you have a vaginal infection. They should see their caregiver and be treated if they have problems, such as a mild rash or itching.   Practice safe sex. Use condoms. Only have 1 sex partner.  PREVENTION  Basic prevention steps can help reduce the risk of upsetting the natural balance of bacteria in the vagina and developing BV:  Do not have sexual intercourse (be abstinent).   Do not douche.   Use all of the medicine prescribed for treatment of BV, even if the signs and symptoms go away.   Tell your sex partner if you have BV. That way, they can be treated, if needed, to prevent reoccurrence.  SEEK MEDICAL CARE IF:   Your symptoms are not improving after 3 days of treatment.   You have increased discharge, pain,  or fever.  MAKE SURE YOU:   Understand these instructions.   Will watch your condition.   Will get help right away if you are not doing well or get worse.  FOR MORE INFORMATION  Division of STD Prevention (DSTDP), Centers for Disease Control and Prevention: www.cdc.gov/std American Social Health Association (ASHA): www.ashastd.org  Document Released: 03/09/2005 Document Revised: 02/26/2011 Document Reviewed: 08/30/2008 ExitCare Patient Information 2012 [employer]. 

## 2011-09-02 DIAGNOSIS — N390 Urinary tract infection, site not specified: Secondary | ICD-10-CM

## 2011-09-02 DIAGNOSIS — O2390 Unspecified genitourinary tract infection in pregnancy, unspecified trimester: Secondary | ICD-10-CM

## 2011-09-13 ENCOUNTER — Inpatient Hospital Stay (HOSPITAL_COMMUNITY)
Admission: AD | Admit: 2011-09-13 | Discharge: 2011-09-13 | Disposition: A | Discharge status: Hospice | Payer: Medicaid Other | Source: Ambulatory Visit | Attending: Obstetrics | Admitting: Obstetrics

## 2011-09-13 ENCOUNTER — Encounter (HOSPITAL_COMMUNITY): Payer: Self-pay | Admitting: *Deleted

## 2011-09-13 DIAGNOSIS — O26899 Other specified pregnancy related conditions, unspecified trimester: Secondary | ICD-10-CM

## 2011-09-13 DIAGNOSIS — N390 Urinary tract infection, site not specified: Secondary | ICD-10-CM | POA: Insufficient documentation

## 2011-09-13 DIAGNOSIS — Z21 Asymptomatic human immunodeficiency virus [HIV] infection status: Secondary | ICD-10-CM

## 2011-09-13 DIAGNOSIS — O2340 Unspecified infection of urinary tract in pregnancy, unspecified trimester: Secondary | ICD-10-CM

## 2011-09-13 DIAGNOSIS — O2390 Unspecified genitourinary tract infection in pregnancy, unspecified trimester: Secondary | ICD-10-CM | POA: Insufficient documentation

## 2011-09-13 DIAGNOSIS — R109 Unspecified abdominal pain: Secondary | ICD-10-CM | POA: Insufficient documentation

## 2011-09-13 DIAGNOSIS — O98519 Other viral diseases complicating pregnancy, unspecified trimester: Secondary | ICD-10-CM | POA: Insufficient documentation

## 2011-09-13 LAB — URINE MICROSCOPIC-ADD ON

## 2011-09-13 LAB — URINALYSIS, ROUTINE W REFLEX MICROSCOPIC
Hgb urine dipstick: NEGATIVE
Protein, ur: NEGATIVE mg/dL
Specific Gravity, Urine: 1.025 (ref 1.005–1.030)
Urobilinogen, UA: 0.2 mg/dL (ref 0.0–1.0)

## 2011-09-13 LAB — WET PREP, GENITAL: Yeast Wet Prep HPF POC: NONE SEEN

## 2011-09-13 MED ORDER — OXYCODONE-ACETAMINOPHEN 5-325 MG PO TABS: 2.0000 | ORAL_TABLET | ORAL | Status: AC | PRN

## 2011-09-13 MED ORDER — CEPHALEXIN 500 MG PO CAPS: 500.0000 mg | ORAL_CAPSULE | Freq: Three times a day (TID) | ORAL | Status: AC

## 2011-09-13 MED ORDER — ACETAMINOPHEN 325 MG PO TABS
650.0000 mg | ORAL_TABLET | Freq: Once | ORAL | Status: DC
  Filled 2011-09-13: qty 2

## 2011-09-13 NOTE — MAU Provider Note (Signed)
Taneya Conkel [id number] @[redacted]w[redacted]d  by LMP Chief Complaint  Patient presents with  . Abdominal Pain     First Provider Initiated Contact with Patient 09/13/11 1958      SUBJECTIVE  HPI: She reports onset this afternoon of the burning pain throughout her upper abdomen. She does not think it is dyspepsia which she has had before and the pain is deeper than the skin level and located over her uterus. It is continuous. This is the first episode. It does not radiate. No nausea, vomiting, fever. She is also having lower pelvic pressure which is intermittent and has been going on for weeks. No dysuria, no abnormal vaginal discharge.  Past Medical History  Diagnosis Date  . HIV positive   . Asthma   . Depression     Postpartum after 1st pregnancy   Past Surgical History  Procedure Date  . Laparoscopy 01/06/2011    Procedure: LAPAROSCOPY OPERATIVE;  Surgeon: Roseanna Rainbow, MD;  Location: [facility];  Service: Gynecology;  Laterality: N/A;  . Laparotomy 01/06/2011    Procedure: LAPAROTOMY;  Surgeon: Roseanna Rainbow, MD;  Location: [facility];  Service: Gynecology;  Laterality: N/A;  . Dilation and curettage of uterus   . Salpingoophorectomy    History   Social History  . Marital Status: Single    Spouse Name: N/A    Number of Children: N/A  . Years of Education: N/A   Occupational History  . Not on file.   Social History Main Topics  . Smoking status: Former Smoker -- 0.5 packs/day for 7 years    Types: Cigarettes    Quit date: 03/29/2011  . Smokeless tobacco: Never Used  . Alcohol Use: No     beer last year  . Drug Use: No     last year  . Sexually Active: Yes     condoms refused   Other Topics Concern  . Not on file   Social History Narrative  . No narrative on file   No current facility-administered medications on file prior to encounter.   Current Outpatient Prescriptions on File Prior to Encounter  Medication Sig Dispense Refill  . lamiVUDine-zidovudine  (COMBIVIR) 150-300 MG per tablet Take 1 tablet by mouth daily.       Marland Kitchen lopinavir-ritonavir (KALETRA) 200-50 MG per tablet Take 1 tablet by mouth daily.       . Prenatal Vit-Fe Fumarate-FA (PRENATAL MULTIVITAMIN) TABS Take 1 tablet by mouth at bedtime.      . promethazine (PHENERGAN) 25 MG tablet Take 1 tablet (25 mg total) by mouth every 6 (six) hours as needed for nausea. May take 1/2 tablet for mild symptoms  30 tablet  0  . DISCONTD: promethazine (PHENERGAN) 25 MG tablet Take 25 mg by mouth Once daily as needed. For nausea       No Known Allergies  ROS: Pertinent items in HPI  OBJECTIVE Blood pressure 119/67, pulse 97, temperature 99 F (37.2 C), temperature source Oral, resp. rate 20, last menstrual period 02/16/2011.  GENERAL: Well-developed, well-nourished female in no acute distress.  HEENT: Normocephalic, good dentition HEART: normal rate RESP: normal effort ABDOMEN: Soft, nontender EXTREMITIES: Nontender, no edema NEURO: Alert and oriented SPECULUM EXAM: NEFG, physiologic discharge, no blood noted, cervix clean BIMANUAL: cervix uterus NSSP; no adnexal tenderness or masses   LAB RESULTS    IMAGING   ASSESSMENT  No diagnosis found.  PLAN      Shavanna Furnari 09/13/2011 7:58 PM  Care assumed by [facility],  NP at 2005 after I took history. Giannina Bartolome 09/13/2011 8:06 PM

## 2011-09-13 NOTE — MAU Note (Signed)
Pt reports she had a burning feeling in her stomach and it started feeling "tiered" also c/o nausea and pelvic pressure. Reports good fetal movement and denies vag bleeding or discharge.

## 2011-09-13 NOTE — MAU Provider Note (Signed)
History     CSN: [id number]  Arrival date & time 09/13/11  1818   None     Chief Complaint  Patient presents with  . Abdominal Pain    HPI [name] is a 26 y.o. female @ [redacted]w[redacted]d gestation who presents to MAU for abdominal pain. The pain started last night as a burning sensation in the mid and lower abdomen. The pain went away but today at 5 pm the pain returned. She rates the pain as a 8/10 that is now constant. She describes the pain as a burning sensation that is deep inside. The pain is located in the mid and lower abdomen. She reports nausea today but no vomiting. She has not taken any thing for pain. Last took tylenol one month ago. Doesn't think it will help. The patient is a high risk patient due to her HIV status. She is followed for prenatal care by Dr. Tamela Oddi.   Past Medical History  Diagnosis Date  . HIV positive   . Asthma   . Depression     Postpartum after 1st pregnancy    Past Surgical History  Procedure Date  . Laparoscopy 01/06/2011    Procedure: LAPAROSCOPY OPERATIVE;  Surgeon: Roseanna Rainbow, MD;  Location: [facility];  Service: Gynecology;  Laterality: N/A;  . Laparotomy 01/06/2011    Procedure: LAPAROTOMY;  Surgeon: Roseanna Rainbow, MD;  Location: [facility];  Service: Gynecology;  Laterality: N/A;  . Dilation and curettage of uterus   . Salpingoophorectomy     Family History  Problem Relation Age of Onset  . Diabetes Mother   . Hypertension Mother   . Hyperlipidemia Mother   . Diabetes Brother   . Diabetes Maternal Aunt   . Hypertension Maternal Aunt     History  Substance Use Topics  . Smoking status: Former Smoker -- 0.5 packs/day for 7 years    Types: Cigarettes    Quit date: 03/29/2011  . Smokeless tobacco: Never Used  . Alcohol Use: No     beer last year    OB History    Grav Para Term Preterm Abortions TAB SAB Ect Mult Living   5 3 2 1 1  0 1 0 0 2      Review of Systems  Constitutional: Negative for fever, chills,  diaphoresis and fatigue.  HENT: Negative for ear pain, congestion, sore throat, facial swelling, neck pain, neck stiffness, dental problem and sinus pressure.   Eyes: Negative for photophobia, pain and discharge.  Respiratory: Negative for cough, chest tightness and wheezing.   Cardiovascular: Negative for chest pain.  Gastrointestinal: Positive for nausea and abdominal pain. Negative for vomiting, diarrhea, constipation and abdominal distention.  Genitourinary: Positive for vaginal discharge. Negative for dysuria, urgency, frequency, flank pain, vaginal bleeding and difficulty urinating.  Musculoskeletal: Negative for myalgias, back pain and gait problem.  Skin: Negative for color change and rash.  Neurological: Negative for dizziness, speech difficulty, weakness, light-headedness, numbness and headaches.  Psychiatric/Behavioral: Negative for confusion and agitation. The patient is not nervous/anxious.     Allergies  Review of patient's allergies indicates no known allergies.  Home Medications  No current outpatient prescriptions on file.  BP 119/67  Pulse 97  Temp 99 F (37.2 C) (Oral)  Resp 20  LMP 02/16/2011  Physical Exam  Nursing note and vitals reviewed. Constitutional: She is oriented to person, place, and time. She appears well-developed and well-nourished. No distress.  HENT:  Head: Normocephalic.  Eyes: EOM are normal.  Neck: Neck supple.  Cardiovascular: Normal rate.   Pulmonary/Chest: Effort normal.  Abdominal: Soft. There is no tenderness.       Unable to reproduce the burning sensation.  Genitourinary:       External genitalia without lesions. Yellow discharge vaginal vault. Cervix finger-tip, thick, high. Uterus consistent with dates.   Musculoskeletal: Normal range of motion.  Neurological: She is alert and oriented to person, place, and time. No cranial nerve deficit.  Skin: Skin is warm and dry.  Psychiatric: She has a normal mood and affect. Her behavior  is normal. Judgment and thought content normal.   EFM: baseline 145, variables, reassuring for gestational age  Results for orders placed during the hospital encounter of 09/13/11 (from the past 24 hour(s))  URINALYSIS, ROUTINE W REFLEX MICROSCOPIC     Status: Abnormal   Collection Time   09/13/11  6:30 PM      Component Value Range   Color, Urine YELLOW  YELLOW   APPearance CLEAR  CLEAR   Specific Gravity, Urine 1.025  1.005 - 1.030   pH 6.0  5.0 - 8.0   Glucose, UA NEGATIVE  NEGATIVE mg/dL   Hgb urine dipstick NEGATIVE  NEGATIVE   Bilirubin Urine NEGATIVE  NEGATIVE   Ketones, ur NEGATIVE  NEGATIVE mg/dL   Protein, ur NEGATIVE  NEGATIVE mg/dL   Urobilinogen, UA 0.2  0.0 - 1.0 mg/dL   Nitrite NEGATIVE  NEGATIVE   Leukocytes, UA SMALL (*) NEGATIVE  URINE MICROSCOPIC-ADD ON     Status: Abnormal   Collection Time   09/13/11  6:30 PM      Component Value Range   Squamous Epithelial / LPF FEW (*) RARE   WBC, UA 3-6  <3 WBC/hpf   RBC / HPF 0-2  <3 RBC/hpf   Bacteria, UA FEW (*) RARE  WET PREP, GENITAL     Status: Abnormal   Collection Time   09/13/11  8:35 PM      Component Value Range   Yeast Wet Prep HPF POC NONE SEEN  NONE SEEN   Trich, Wet Prep NONE SEEN  NONE SEEN   Clue Cells Wet Prep HPF POC FEW (*) NONE SEEN   WBC, Wet Prep HPF POC FEW (*) NONE SEEN   Offered patient tylenol for pain and she declined. States she needs something that will help her.  ED Course @ 21:33 Discussed findings with Dr. Clearance Coots  Procedures   Assessment: [redacted]w[redacted]d gestation with UTI   HIV positive   Abdominal pain in pregnancy  Plan:  Urine culture, GC, Chlamydia cultures   Rx Keflex   Rx Percocet   Follow up in the office

## 2011-09-13 NOTE — Discharge Instructions (Signed)
Abdominal Pain During Pregnancy Abdominal discomfort is common in pregnancy. Most of the time, it does not cause harm. There are many causes of abdominal pain. Some causes are more serious than others. Some of the causes of abdominal pain in pregnancy are easily diagnosed. Occasionally, the diagnosis takes time to understand. Other times, the cause is not determined. Abdominal pain can be a sign that something is very wrong with the pregnancy, or the pain may have nothing to do with the pregnancy at all. For this reason, always tell your caregiver if you have any abdominal discomfort. CAUSES Common and harmless causes of abdominal pain include:  Constipation.   Excess gas and bloating.   Round ligament pain. This is pain that is felt in the folds of the groin.   The position the baby or placenta is in.   Baby kicks.   Braxton-Hicks contractions. These are mild contractions that do not cause cervical dilation.  Serious causes of abdominal pain include:  Ectopic pregnancy. This happens when a fertilized egg implants outside of the uterus.   Miscarriage.   Preterm labor. This is when labor starts at less than 37 weeks of pregnancy.   Placental abruption. This is when the placenta partially or completely separates from the uterus.   Preeclampsia. This is often associated with high blood pressure and has been referred to as "toxemia in pregnancy."   Uterine or amniotic fluid infections.  Causes unrelated to pregnancy include:  Urinary tract infection.   Gallbladder stones or inflammation.   Hepatitis or other liver illness.   Intestinal problems, stomach flu, food poisoning, or ulcer.   Appendicitis.   Kidney (renal) stones.   Kidney infection (pylonephritis).  HOME CARE INSTRUCTIONS  For mild pain:  Do not have sexual intercourse or put anything in your vagina until your symptoms go away completely.   Get plenty of rest until your pain improves. If your pain does not  improve in 1 hour, call your caregiver.   Drink clear fluids if you feel nauseous. Avoid solid food as long as you are uncomfortable or nauseous.   Only take medicine as directed by your caregiver.   Keep all follow-up appointments with your caregiver.  SEEK IMMEDIATE MEDICAL CARE IF:  You are bleeding, leaking fluid, or passing tissue from the vagina.   You have increasing pain or cramping.   You have persistent vomiting.   You have painful or bloody urination.   You have a fever.   You notice a decrease in your baby's movements.   You have extreme weakness or feel faint.   You have shortness of breath, with or without abdominal pain.   You develop a severe headache with abdominal pain.   You have abnormal vaginal discharge with abdominal pain.   You have persistent diarrhea.   You have abdominal pain that continues even after rest, or gets worse.  MAKE SURE YOU:   Understand these instructions.   Will watch your condition.   Will get help right away if you are not doing well or get worse.  Document Released: 03/09/2005 Document Revised: 02/26/2011 Document Reviewed: 10/03/2010 ExitCare Patient Information 2012 [employer].Abdominal Pain During Pregnancy Abdominal discomfort is common in pregnancy. Most of the time, it does not cause harm. There are many causes of abdominal pain. Some causes are more serious than others. Some of the causes of abdominal pain in pregnancy are easily diagnosed. Occasionally, the diagnosis takes time to understand. Other times, the cause is not determined.   Abdominal pain can be a sign that something is very wrong with the pregnancy, or the pain may have nothing to do with the pregnancy at all. For this reason, always tell your caregiver if you have any abdominal discomfort. CAUSES Common and harmless causes of abdominal pain include:  Constipation.   Excess gas and bloating.   Round ligament pain. This is pain that is felt in the  folds of the groin.   The position the baby or placenta is in.   Baby kicks.   Braxton-Hicks contractions. These are mild contractions that do not cause cervical dilation.  Serious causes of abdominal pain include:  Ectopic pregnancy. This happens when a fertilized egg implants outside of the uterus.   Miscarriage.   Preterm labor. This is when labor starts at less than 37 weeks of pregnancy.   Placental abruption. This is when the placenta partially or completely separates from the uterus.   Preeclampsia. This is often associated with high blood pressure and has been referred to as "toxemia in pregnancy."   Uterine or amniotic fluid infections.  Causes unrelated to pregnancy include:  Urinary tract infection.   Gallbladder stones or inflammation.   Hepatitis or other liver illness.   Intestinal problems, stomach flu, food poisoning, or ulcer.   Appendicitis.   Kidney (renal) stones.   Kidney infection (pylonephritis).  HOME CARE INSTRUCTIONS  For mild pain:  Do not have sexual intercourse or put anything in your vagina until your symptoms go away completely.   Get plenty of rest until your pain improves. If your pain does not improve in 1 hour, call your caregiver.   Drink clear fluids if you feel nauseous. Avoid solid food as long as you are uncomfortable or nauseous.   Only take medicine as directed by your caregiver.   Keep all follow-up appointments with your caregiver.  SEEK IMMEDIATE MEDICAL CARE IF:  You are bleeding, leaking fluid, or passing tissue from the vagina.   You have increasing pain or cramping.   You have persistent vomiting.   You have painful or bloody urination.   You have a fever.   You notice a decrease in your baby's movements.   You have extreme weakness or feel faint.   You have shortness of breath, with or without abdominal pain.   You develop a severe headache with abdominal pain.   You have abnormal vaginal discharge  with abdominal pain.   You have persistent diarrhea.   You have abdominal pain that continues even after rest, or gets worse.  MAKE SURE YOU:   Understand these instructions.   Will watch your condition.   Will get help right away if you are not doing well or get worse.  Document Released: 03/09/2005 Document Revised: 02/26/2011 Document Reviewed: 10/03/2010 ExitCare Patient Information 2012 [employer]. 

## 2011-09-13 NOTE — MAU Note (Signed)
"  I have been having abd pressure that has been going on all day.  Now it is constant for about an hour ago.  I tried lying down, but it didn't work.  So, I decided to come here.  No abnormal vaginal d/c, leaking of fluid or bleeding.  (+) FM.  Last appt on 09/09/11.  I've had a NST the last 3 appts.  No other problems with this pregnancy."

## 2011-09-14 LAB — GC/CHLAMYDIA PROBE AMP, GENITAL: Chlamydia, DNA Probe: NEGATIVE

## 2011-09-15 LAB — URINE CULTURE
Colony Count: NO GROWTH
Culture  Setup Time: 201306240223
Culture: NO GROWTH

## 2011-09-22 ENCOUNTER — Telehealth: Payer: Self-pay | Admitting: *Deleted

## 2011-09-22 ENCOUNTER — Ambulatory Visit: Payer: Medicaid Other | Admitting: Internal Medicine

## 2011-09-22 NOTE — Telephone Encounter (Signed)
Patient referred to [facility] for multiple no shows, patient is pregnant. [name] CMA

## 2011-09-29 ENCOUNTER — Telehealth: Payer: Self-pay | Admitting: *Deleted

## 2011-09-29 NOTE — Telephone Encounter (Signed)
Bridge Counselor contacted patint to see if she would be interested in enrolling in program for assistance with appointments and other concerns.  Patient stated that at this time she was "good" and did not need any help.  However, she would keep BC's number in her phone just in case things changed.  She is aware of her follow-up appointment at the end of the month and will make sure to come.

## 2011-10-20 ENCOUNTER — Encounter: Payer: Self-pay | Admitting: Internal Medicine

## 2011-10-20 ENCOUNTER — Ambulatory Visit (INDEPENDENT_AMBULATORY_CARE_PROVIDER_SITE_OTHER): Payer: Medicaid Other | Admitting: Internal Medicine

## 2011-10-20 VITALS — BP 120/80 | HR 105 | Temp 98.0°F | Ht 62.0 in | Wt 202.0 lb

## 2011-10-20 DIAGNOSIS — B20 Human immunodeficiency virus [HIV] disease: Secondary | ICD-10-CM

## 2011-10-20 LAB — COMPLETE METABOLIC PANEL WITH GFR
AST: 13 U/L (ref 0–37)
Alkaline Phosphatase: 54 U/L (ref 39–117)
BUN: 6 mg/dL (ref 6–23)
Creat: 0.46 mg/dL — ABNORMAL LOW (ref 0.50–1.10)
GFR, Est Non African American: >89 mL/min
Glucose, Bld: 84 mg/dL (ref 70–99)
Potassium: 4.2 mEq/L (ref 3.5–5.3)
Total Bilirubin: 1 mg/dL (ref 0.3–1.2)

## 2011-10-20 LAB — CBC WITH DIFFERENTIAL/PLATELET
Basophils Absolute: 0 10*3/uL (ref 0.0–0.1)
Basophils Relative: 0 % (ref 0–1)
Eosinophils Absolute: 0.2 10*3/uL (ref 0.0–0.7)
HCT: 33 % — ABNORMAL LOW (ref 36.0–46.0)
MCH: 30.4 pg (ref 26.0–34.0)
MCHC: 34.8 g/dL (ref 30.0–36.0)
Monocytes Absolute: 0.5 10*3/uL (ref 0.1–1.0)
Neutro Abs: 6.5 10*3/uL (ref 1.7–7.7)
RDW: 13.9 % (ref 11.5–15.5)

## 2011-10-20 NOTE — Assessment & Plan Note (Signed)
Will check labs today.  Reports good compliance.  I reminded her of the importance for her and the baby and the need for close follow up with labs.  Repeat labs in 3 weeks if ok today (sooner if not).

## 2011-10-20 NOTE — Progress Notes (Signed)
  Subjective:    Patient ID: [name], female    DOB: 03/07/1986, 26 y.o.   MRN: [id number]  HPI Patient arrived 1 1/2 hours late for her appointment, no recent labs.  Pregnant at [redacted] weeks.  Feels well.  Reports complete adherence to her ARVs of norvir, Reyataz and Truvada.  Follows with OB.     Review of Systems  Constitutional: Negative for fever, chills, appetite change, fatigue and unexpected weight change.  HENT: Negative for sore throat and trouble swallowing.   Respiratory: Negative for cough, shortness of breath and wheezing.   Cardiovascular: Negative for chest pain, palpitations and leg swelling.  Gastrointestinal: Negative for nausea, abdominal pain, diarrhea and constipation.  Musculoskeletal: Negative for myalgias, joint swelling and arthralgias.  Skin: Negative for rash.  Neurological: Negative for dizziness and headaches.  Hematological: Negative for adenopathy.  Psychiatric/Behavioral: Negative for dysphoric mood. The patient is not nervous/anxious.        Objective:   Physical Exam  Constitutional: She appears well-developed and well-nourished. No distress.  HENT:  Mouth/Throat: Oropharynx is clear and moist. No oropharyngeal exudate.  Cardiovascular: Normal rate, regular rhythm and normal heart sounds.  Exam reveals no gallop and no friction rub.   No murmur heard. Lymphadenopathy:    She has no cervical adenopathy.          Assessment & Plan:

## 2011-10-21 LAB — T-HELPER CELL (CD4) - (RCID CLINIC ONLY)
CD4 % Helper T Cell: 40 % (ref 33–55)
CD4 T Cell Abs: 610 uL (ref 400–2700)

## 2011-10-22 LAB — HIV-1 RNA QUANT-NO REFLEX-BLD
HIV 1 RNA Quant: <20 copies/mL (ref <20)
HIV-1 RNA Quant, Log: <1.3 {Log} (ref <1.30)

## 2011-11-04 LAB — OB RESULTS CONSOLE GBS: GBS: NEGATIVE

## 2011-11-24 ENCOUNTER — Ambulatory Visit (INDEPENDENT_AMBULATORY_CARE_PROVIDER_SITE_OTHER): Payer: Medicaid Other | Admitting: Internal Medicine

## 2011-11-24 ENCOUNTER — Encounter: Payer: Self-pay | Admitting: Internal Medicine

## 2011-11-24 VITALS — BP 113/77 | HR 109 | Temp 98.5°F | Ht 62.0 in | Wt 209.0 lb

## 2011-11-24 DIAGNOSIS — B20 Human immunodeficiency virus [HIV] disease: Secondary | ICD-10-CM

## 2011-11-24 NOTE — Progress Notes (Signed)
  Subjective:    Patient ID: [name], female    DOB: 1986-03-07, 26 y.o.   MRN: [id number]  HPI She comes in for her appointment for followup. She continues on Reyataz, Norvir and Truvada is currently at 38 weeks of pregnancy. She has no specific issues and continues to take her medicine without any missed doses. Last labs were about one month ago.   Review of Systems  Constitutional: Negative for fever and chills.  Respiratory: Negative for shortness of breath and wheezing.   Cardiovascular: Negative for leg swelling.  Gastrointestinal: Negative for nausea and diarrhea.  Neurological: Negative for dizziness and headaches.       Objective:   Physical Exam  Constitutional: She appears well-developed and well-nourished. No distress.  Cardiovascular: Normal rate, regular rhythm and normal heart sounds.  Exam reveals no gallop and no friction rub.   No murmur heard. Pulmonary/Chest: Effort normal and breath sounds normal. No respiratory distress. She has no wheezes.  Abdominal:       gravida          Assessment & Plan:

## 2011-11-24 NOTE — Assessment & Plan Note (Signed)
She has been undetectable and I will check her labs today to assure she remains undetectable.

## 2011-11-26 LAB — T-HELPER CELL (CD4) - (RCID CLINIC ONLY): CD4 T Cell Abs: 670 uL (ref 400–2700)

## 2011-11-26 LAB — HIV-1 RNA QUANT-NO REFLEX-BLD
HIV 1 RNA Quant: <20 copies/mL (ref <20)
HIV-1 RNA Quant, Log: <1.3 {Log} (ref <1.30)

## 2011-11-27 ENCOUNTER — Inpatient Hospital Stay (HOSPITAL_COMMUNITY)
Admission: AD | Admit: 2011-11-27 | Discharge: 2011-11-27 | Disposition: A | Discharge status: Home / Self Care | Payer: Medicaid Other | Source: Ambulatory Visit | Attending: Obstetrics & Gynecology | Admitting: Obstetrics & Gynecology

## 2011-11-27 ENCOUNTER — Encounter (HOSPITAL_COMMUNITY): Payer: Self-pay | Admitting: *Deleted

## 2011-11-27 ENCOUNTER — Other Ambulatory Visit: Payer: Self-pay | Admitting: *Deleted

## 2011-11-27 DIAGNOSIS — O99891 Other specified diseases and conditions complicating pregnancy: Secondary | ICD-10-CM | POA: Insufficient documentation

## 2011-11-27 DIAGNOSIS — R109 Unspecified abdominal pain: Secondary | ICD-10-CM | POA: Insufficient documentation

## 2011-11-27 DIAGNOSIS — N949 Unspecified condition associated with female genital organs and menstrual cycle: Secondary | ICD-10-CM

## 2011-11-27 HISTORY — DX: Unspecified hemorrhoids: K64.9

## 2011-11-27 LAB — URINE MICROSCOPIC-ADD ON

## 2011-11-27 LAB — URINALYSIS, ROUTINE W REFLEX MICROSCOPIC
Bilirubin Urine: NEGATIVE
Glucose, UA: NEGATIVE mg/dL
Hgb urine dipstick: NEGATIVE
Protein, ur: NEGATIVE mg/dL
Urobilinogen, UA: 0.2 mg/dL (ref 0.0–1.0)

## 2011-11-27 MED ORDER — ACETAMINOPHEN 500 MG PO TABS
1000.0000 mg | ORAL_TABLET | Freq: Once | ORAL | Status: AC
  Administered 2011-11-27: 1000 mg via ORAL
  Filled 2011-11-27: qty 2

## 2011-11-27 NOTE — MAU Note (Signed)
C/o pain with urination; c/o constant,sharp, vaginal tightening for past week;

## 2011-11-27 NOTE — MAU Provider Note (Signed)
Chief Complaint:  Abdominal Pain and Hemorrhoids   First Provider Initiated Contact with Patient 11/27/11 1217      HPI: [name] is a 26 y.o. [id number] at [redacted]w[redacted]d who presents to maternity admissions reporting 1 wk hx of vaginal sharp pain with walking, now worse. Denies groin or back pain but some UCs are painful. States cx 3 cm in office yesterday. Using Anusol for hemorrhoids.  Denies  leakage of fluid or vaginal bleeding. Good fetal movement.   Pregnancy Course:   Past Medical History: Past Medical History  Diagnosis Date  . HIV positive   . Asthma   . Depression     Postpartum after 1st pregnancy  . Hemorrhoids     Past obstetric history: OB History    Grav Para Term Preterm Abortions TAB SAB Ect Mult Living   5 3 2 1 1  0 1 0 0 2     # Outc Date GA Lbr Len/2nd Wgt Sex Del Anes PTL Lv   1 TRM 12/06           2 PRE 4/09 [redacted]w[redacted]d   F SVD   Yes   3 TRM 10/11    M    No   4 SAB  [redacted]w[redacted]d       No   Comments: D&C    5 CUR               Past Surgical History: Past Surgical History  Procedure Date  . Laparoscopy 01/06/2011    Procedure: LAPAROSCOPY OPERATIVE;  Surgeon: Roseanna Rainbow, MD;  Location: [facility];  Service: Gynecology;  Laterality: N/A;  . Laparotomy 01/06/2011    Procedure: LAPAROTOMY;  Surgeon: Roseanna Rainbow, MD;  Location: [facility];  Service: Gynecology;  Laterality: N/A;  . Dilation and curettage of uterus   . Salpingoophorectomy     Family History: Family History  Problem Relation Age of Onset  . Diabetes Mother   . Hypertension Mother   . Hyperlipidemia Mother   . Diabetes Brother   . Diabetes Maternal Aunt   . Hypertension Maternal Aunt     Social History: History  Substance Use Topics  . Smoking status: Former Smoker -- 0.5 packs/day for 7 years    Types: Cigarettes    Quit date: 03/29/2011  . Smokeless tobacco: Never Used  . Alcohol Use: No     beer last year    Allergies: No Known Allergies  Meds:  Prescriptions prior to  admission  Medication Sig Dispense Refill  . atazanavir (REYATAZ) 300 MG capsule Take 300 mg by mouth daily with breakfast.      . emtricitabine-tenofovir (TRUVADA) 200-300 MG per tablet Take 1 tablet by mouth daily.      . Prenatal Vit-Fe Fumarate-FA (PRENATAL MULTIVITAMIN) TABS Take 1 tablet by mouth at bedtime.      . ritonavir (NORVIR) 100 MG TABS Take 100 mg by mouth daily with breakfast.        ROS: Pertinent findings in history of present illness.  Physical Exam  Blood pressure 122/84, pulse 82, temperature 98.5 F (36.9 C), temperature source Oral, resp. rate 16, height 5' 2.5" (1.588 m), weight 94.802 kg (209 lb), last menstrual period 02/16/2011. GENERAL: Well-developed, well-nourished female in no acute distress.  HEENT: normocephalic HEART: normal rate RESP: normal effort ABDOMEN: Soft, non-tender, gravid appropriate for gestational age EXTREMITIES: Nontender, no edema NEURO: alert and oriented SPECULUM EXAM: NEFG, physiologic discharge, no blood, cervix clean Dilation: 1.5 Effacement (%):  (  long) Station: -3 Presentation: Vertex Exam by:: D Makinzie Considine CNM  FHT:  Baseline 135 , moderate variability, accelerations present, no decelerations Contractions: irreg, mild   Labs: Results for orders placed during the hospital encounter of 11/27/11 (from the past 24 hour(s))  URINALYSIS, ROUTINE W REFLEX MICROSCOPIC     Status: Abnormal   Collection Time   11/27/11 10:15 AM      Component Value Range   Color, Urine YELLOW  YELLOW   APPearance CLEAR  CLEAR   Specific Gravity, Urine 1.025  1.005 - 1.030   pH 6.0  5.0 - 8.0   Glucose, UA NEGATIVE  NEGATIVE mg/dL   Hgb urine dipstick NEGATIVE  NEGATIVE   Bilirubin Urine NEGATIVE  NEGATIVE   Ketones, ur NEGATIVE  NEGATIVE mg/dL   Protein, ur NEGATIVE  NEGATIVE mg/dL   Urobilinogen, UA 0.2  0.0 - 1.0 mg/dL   Nitrite NEGATIVE  NEGATIVE   Leukocytes, UA SMALL (*) NEGATIVE  URINE MICROSCOPIC-ADD ON     Status: Abnormal   Collection  Time   11/27/11 10:15 AM      Component Value Range   Squamous Epithelial / LPF MANY (*) RARE   WBC, UA 3-6  <3 WBC/hpf   RBC / HPF 0-2  <3 RBC/hpf   Bacteria, UA FEW (*) RARE     Assessment: 1. Round ligament pain   G5P2 at [redacted]w[redacted]d Cat 1 FHR tracing  Plan: Discharge home Round ligament pain relief measures reviewed.  May use Tylenol. Labor precautions and fetal kick counts  Medication List  As of 11/27/2011  1:03 PM   TAKE these medications         atazanavir 300 MG capsule   Commonly known as: REYATAZ   Take 300 mg by mouth daily with breakfast.      emtricitabine-tenofovir 200-300 MG per tablet   Commonly known as: TRUVADA   Take 1 tablet by mouth daily.      prenatal multivitamin Tabs   Take 1 tablet by mouth at bedtime.      ritonavir 100 MG Tabs   Commonly known as: NORVIR   Take 100 mg by mouth daily with breakfast.            Danae Orleans, CNM 11/27/2011 1:00 PM

## 2011-11-27 NOTE — MAU Note (Signed)
Lower abd pain- constant.  "hemorrhoids keep coming out".

## 2011-11-29 ENCOUNTER — Inpatient Hospital Stay (HOSPITAL_COMMUNITY): Payer: Medicaid Other | Admitting: Anesthesiology

## 2011-11-29 ENCOUNTER — Encounter (HOSPITAL_COMMUNITY): Payer: Self-pay | Admitting: *Deleted

## 2011-11-29 ENCOUNTER — Encounter (HOSPITAL_COMMUNITY): Payer: Self-pay | Admitting: Anesthesiology

## 2011-11-29 ENCOUNTER — Inpatient Hospital Stay (HOSPITAL_COMMUNITY)
Admission: AD | Admit: 2011-11-29 | Discharge: 2011-12-01 | DRG: 774 | Disposition: A | Discharge status: Home / Self Care | Payer: Medicaid Other | Source: Ambulatory Visit | Attending: Obstetrics & Gynecology | Admitting: Obstetrics & Gynecology

## 2011-11-29 DIAGNOSIS — O98519 Other viral diseases complicating pregnancy, unspecified trimester: Secondary | ICD-10-CM | POA: Diagnosis present

## 2011-11-29 DIAGNOSIS — Z21 Asymptomatic human immunodeficiency virus [HIV] infection status: Secondary | ICD-10-CM | POA: Diagnosis present

## 2011-11-29 DIAGNOSIS — IMO0001 Reserved for inherently not codable concepts without codable children: Secondary | ICD-10-CM

## 2011-11-29 LAB — TYPE AND SCREEN: ABO/RH(D): O POS

## 2011-11-29 LAB — CBC
HCT: 39.6 % (ref 36.0–46.0)
Hemoglobin: 12.9 g/dL (ref 12.0–15.0)
WBC: 10.3 10*3/uL (ref 4.0–10.5)

## 2011-11-29 MED ORDER — FLEET ENEMA 7-19 GM/118ML RE ENEM: 1.0000 | ENEMA | RECTAL | Status: DC | PRN

## 2011-11-29 MED ORDER — LACTATED RINGERS IV SOLN: INTRAVENOUS | Status: DC

## 2011-11-29 MED ORDER — OXYTOCIN 40 UNITS IN LACTATED RINGERS INFUSION - SIMPLE MED: 62.5000 mL/h | Freq: Once | INTRAVENOUS | Status: DC

## 2011-11-29 MED ORDER — CITRIC ACID-SODIUM CITRATE 334-500 MG/5ML PO SOLN: 30.0000 mL | ORAL | Status: DC | PRN

## 2011-11-29 MED ORDER — RITONAVIR 100 MG PO TABS
100.0000 mg | ORAL_TABLET | Freq: Every day | ORAL | Status: DC
  Filled 2011-11-29: qty 1

## 2011-11-29 MED ORDER — DIPHENHYDRAMINE HCL 50 MG/ML IJ SOLN: 12.5000 mg | INTRAMUSCULAR | Status: DC | PRN

## 2011-11-29 MED ORDER — ONDANSETRON HCL 4 MG/2ML IJ SOLN: 4.0000 mg | Freq: Four times a day (QID) | INTRAMUSCULAR | Status: DC | PRN

## 2011-11-29 MED ORDER — IBUPROFEN 600 MG PO TABS: 600.0000 mg | ORAL_TABLET | Freq: Four times a day (QID) | ORAL | Status: DC | PRN

## 2011-11-29 MED ORDER — LIDOCAINE HCL (PF) 1 % IJ SOLN
30.0000 mL | INTRAMUSCULAR | Status: DC | PRN
  Administered 2011-11-29: 30 mL via SUBCUTANEOUS
  Filled 2011-11-29: qty 30

## 2011-11-29 MED ORDER — PHENYLEPHRINE 40 MCG/ML (10ML) SYRINGE FOR IV PUSH (FOR BLOOD PRESSURE SUPPORT)
80.0000 ug | PREFILLED_SYRINGE | INTRAVENOUS | Status: DC | PRN
  Filled 2011-11-29: qty 2

## 2011-11-29 MED ORDER — ONDANSETRON HCL 4 MG/2ML IJ SOLN
4.0000 mg | Freq: Four times a day (QID) | INTRAMUSCULAR | Status: DC | PRN
  Administered 2011-11-29: 4 mg via INTRAVENOUS
  Filled 2011-11-29: qty 2

## 2011-11-29 MED ORDER — OXYTOCIN 40 UNITS IN LACTATED RINGERS INFUSION - SIMPLE MED
62.5000 mL/h | Freq: Once | INTRAVENOUS | Status: AC
  Administered 2011-11-29: 62.5 mL/h via INTRAVENOUS
  Filled 2011-11-29: qty 1000

## 2011-11-29 MED ORDER — ATAZANAVIR SULFATE 150 MG PO CAPS
300.0000 mg | ORAL_CAPSULE | Freq: Every day | ORAL | Status: DC
  Filled 2011-11-29: qty 2

## 2011-11-29 MED ORDER — LACTATED RINGERS IV SOLN: 500.0000 mL | INTRAVENOUS | Status: DC | PRN

## 2011-11-29 MED ORDER — IBUPROFEN 600 MG PO TABS
600.0000 mg | ORAL_TABLET | Freq: Four times a day (QID) | ORAL | Status: DC | PRN
  Administered 2011-11-29: 600 mg via ORAL
  Filled 2011-11-29: qty 1

## 2011-11-29 MED ORDER — EPHEDRINE 5 MG/ML INJ
10.0000 mg | INTRAVENOUS | Status: DC | PRN
  Filled 2011-11-29: qty 2
  Filled 2011-11-29: qty 4

## 2011-11-29 MED ORDER — ATAZANAVIR SULFATE 150 MG PO CAPS
300.0000 mg | ORAL_CAPSULE | Freq: Every day | ORAL | Status: DC
  Administered 2011-11-30 – 2011-12-01 (×2): 300 mg via ORAL
  Filled 2011-11-29 (×3): qty 2

## 2011-11-29 MED ORDER — OXYTOCIN BOLUS FROM INFUSION
500.0000 mL | Freq: Once | INTRAVENOUS | Status: DC
  Filled 2011-11-29: qty 500

## 2011-11-29 MED ORDER — OXYTOCIN BOLUS FROM INFUSION
500.0000 mL | Freq: Once | INTRAVENOUS | Status: AC
  Administered 2011-11-29: 250 mL via INTRAVENOUS
  Filled 2011-11-29: qty 500

## 2011-11-29 MED ORDER — PHENYLEPHRINE 40 MCG/ML (10ML) SYRINGE FOR IV PUSH (FOR BLOOD PRESSURE SUPPORT)
80.0000 ug | PREFILLED_SYRINGE | INTRAVENOUS | Status: DC | PRN
  Filled 2011-11-29: qty 2
  Filled 2011-11-29: qty 5

## 2011-11-29 MED ORDER — OXYCODONE-ACETAMINOPHEN 5-325 MG PO TABS: 1.0000 | ORAL_TABLET | ORAL | Status: DC | PRN

## 2011-11-29 MED ORDER — LACTATED RINGERS IV SOLN: 500.0000 mL | Freq: Once | INTRAVENOUS | Status: DC

## 2011-11-29 MED ORDER — LIDOCAINE HCL (PF) 1 % IJ SOLN
INTRAMUSCULAR | Status: DC | PRN
  Administered 2011-11-29 (×2): 4 mL

## 2011-11-29 MED ORDER — EMTRICITABINE-TENOFOVIR DF 200-300 MG PO TABS
1.0000 | ORAL_TABLET | Freq: Every day | ORAL | Status: DC
Start: 2011-11-29 — End: 2011-12-01
  Administered 2011-11-29 – 2011-12-01 (×3): 1 via ORAL
  Filled 2011-11-29 (×4): qty 1

## 2011-11-29 MED ORDER — FENTANYL 2.5 MCG/ML BUPIVACAINE 1/10 % EPIDURAL INFUSION (WH - ANES)
INTRAMUSCULAR | Status: DC | PRN
  Administered 2011-11-29: 14 mL/h via EPIDURAL

## 2011-11-29 MED ORDER — RITONAVIR 100 MG PO TABS
100.0000 mg | ORAL_TABLET | Freq: Every day | ORAL | Status: DC
  Administered 2011-11-30 – 2011-12-01 (×2): 100 mg via ORAL
  Filled 2011-11-29 (×3): qty 1

## 2011-11-29 MED ORDER — RITONAVIR 100 MG PO TABS
100.0000 mg | ORAL_TABLET | Freq: Once | ORAL | Status: AC
  Administered 2011-11-29: 100 mg via ORAL
  Filled 2011-11-29: qty 1

## 2011-11-29 MED ORDER — ATAZANAVIR SULFATE 150 MG PO CAPS
300.0000 mg | ORAL_CAPSULE | Freq: Once | ORAL | Status: AC
  Administered 2011-11-29: 300 mg via ORAL
  Filled 2011-11-29: qty 2

## 2011-11-29 MED ORDER — FENTANYL 2.5 MCG/ML BUPIVACAINE 1/10 % EPIDURAL INFUSION (WH - ANES)
14.0000 mL/h | INTRAMUSCULAR | Status: DC
  Administered 2011-11-29: 14 mL/h via EPIDURAL
  Filled 2011-11-29 (×2): qty 60

## 2011-11-29 MED ORDER — LIDOCAINE HCL (PF) 1 % IJ SOLN: 30.0000 mL | INTRAMUSCULAR | Status: DC | PRN

## 2011-11-29 MED ORDER — ACETAMINOPHEN 325 MG PO TABS: 650.0000 mg | ORAL_TABLET | ORAL | Status: DC | PRN

## 2011-11-29 MED ORDER — EPHEDRINE 5 MG/ML INJ
10.0000 mg | INTRAVENOUS | Status: DC | PRN
  Filled 2011-11-29: qty 2

## 2011-11-29 NOTE — Progress Notes (Signed)
[name] is a 26 y.o. [phone] at [redacted]w[redacted]d by LMP admitted for active labor  Subjective: Comfortable  Objective: BP 123/80  Pulse 107  Temp 97.4 F (36.3 C) (Axillary)  Resp 20  SpO2 99%  LMP 02/16/2011   Total I/O In: -  Out: 400 [Urine:400]  FHT:  FHR: 140 bpm, variability: moderate,  accelerations:  Present,  decelerations:  Absent UC:   regular, every 2 minutes SVE:   Dilation: 8.5 Effacement (%): 100 Station: -2 Exam by:: dr Tamela Oddi  Labs: Lab Results  Component Value Date   WBC 10.3 11/29/2011   HGB 12.9 11/29/2011   HCT 39.6 11/29/2011   MCV 92.3 11/29/2011   PLT 185 11/29/2011    Assessment / Plan: Spontaneous labor, progressing normally  Labor: Progressing normally Preeclampsia:  n/a Fetal Wellbeing:  Category I Pain Control:  Epidural I/D:  n/a Anticipated MOD:  NSVD  [name] A 11/29/2011, 9:15 PM

## 2011-11-29 NOTE — Anesthesia Preprocedure Evaluation (Signed)
Anesthesia Evaluation  Patient identified by MRN, date of birth, ID band Patient awake    Reviewed: Allergy & Precautions, H&P , Patient's Chart, lab work & pertinent test results  Airway Mallampati: III TM Distance: >3 FB Neck ROM: full    Dental No notable dental hx. (+) Teeth Intact   Pulmonary asthma , former smoker,  breath sounds clear to auscultation  Pulmonary exam normal       Cardiovascular negative cardio ROS  Rhythm:regular Rate:Normal     Neuro/Psych PSYCHIATRIC DISORDERS Depression negative neurological ROS     GI/Hepatic negative GI ROS, Neg liver ROS,   Endo/Other  Morbid obesity  Renal/GU negative Renal ROS  negative genitourinary   Musculoskeletal negative musculoskeletal ROS (+)   Abdominal Normal abdominal exam  (+)   Peds  Hematology negative hematology ROS (+) HIV positive   Anesthesia Other Findings   Reproductive/Obstetrics (+) Pregnancy                           Anesthesia Physical Anesthesia Plan  ASA: III  Anesthesia Plan: Epidural   Post-op Pain Management:    Induction:   Airway Management Planned:   Additional Equipment:   Intra-op Plan:   Post-operative Plan:   Informed Consent: I have reviewed the patients History and Physical, chart, labs and discussed the procedure including the risks, benefits and alternatives for the proposed anesthesia with the patient or authorized representative who has indicated his/her understanding and acceptance.     Plan Discussed with: Anesthesiologist  Anesthesia Plan Comments:         Anesthesia Quick Evaluation

## 2011-11-29 NOTE — MAU Note (Signed)
Pt reports she started having ctx at 330 about 10 min apart. Denies vag bleeding or leaking and reports good fetal movement

## 2011-11-29 NOTE — H&P (Signed)
[name] is a 26 y.o. female presenting for SROM/contractions. Maternal Medical History:  Reason for admission: Reason for admission: rupture of membranes and contractions.  Contractions: Frequency: regular.    Fetal activity: Perceived fetal activity is normal.    Prenatal complications: HIV.     OB History    Grav Para Term Preterm Abortions TAB SAB Ect Mult Living   5 3 2 1 1  0 1 0 0 2     Past Medical History  Diagnosis Date  . HIV positive   . Asthma   . Depression     Postpartum after 1st pregnancy  . Hemorrhoids    Past Surgical History  Procedure Date  . Laparoscopy 01/06/2011    Procedure: LAPAROSCOPY OPERATIVE;  Surgeon: Mary Rainbow, MD;  Location: [facility];  Service: Gynecology;  Laterality: N/A;  . Laparotomy 01/06/2011    Procedure: LAPAROTOMY;  Surgeon: Mary Rainbow, MD;  Location: [facility];  Service: Gynecology;  Laterality: N/A;  . Dilation and curettage of uterus   . Salpingoophorectomy    Family History: family history includes Diabetes in her brother, maternal aunt, and mother; Hyperlipidemia in her mother; and Hypertension in her maternal aunt and mother. Social History:  reports that she quit smoking about 8 months ago. Her smoking use included Cigarettes. She has a 3.5 pack-year smoking history. She has never used smokeless tobacco. She reports that she does not drink alcohol or use illicit drugs.     Review of Systems  Constitutional: Negative for fever.  Eyes: Negative for blurred vision.  Respiratory: Negative for shortness of breath.   Gastrointestinal: Negative for vomiting.  Skin: Negative for rash.  Neurological: Negative for headaches.    Dilation: 7 Effacement (%): 100 Station: -2 Exam by:: hk Blood pressure 98/73, pulse 114, temperature 98.1 F (36.7 C), temperature source Oral, resp. rate 18, last menstrual period 02/16/2011, SpO2 99.00%. Maternal Exam:  Uterine Assessment: Contraction frequency is regular.    Abdomen: not evaluated.  Introitus: not evaluated.   Cervix: Cervix evaluated by digital exam.     Fetal Exam Fetal Monitor Review: Variability: moderate (6-25 bpm).   Pattern: accelerations present and no decelerations.    Fetal State Assessment: Category I - tracings are normal.     Physical Exam  Constitutional: She appears well-developed.  HENT:  Head: Normocephalic.  Neck: Neck supple. No thyromegaly present.  Cardiovascular: Normal rate and regular rhythm.   Respiratory: Breath sounds normal.  GI: Soft. Bowel sounds are normal.  Skin: No rash noted.    Prenatal labs: ABO, Rh: --/--/O POS (01/30 1913) Antibody: NEG (10/16 2254) Rubella:   RPR:    HBsAg:    HIV: Reactive (02/28 0000)  GBS: Negative (08/14 0000)   Assessment/Plan: Multipara @ [redacted]w[redacted]d.  Active labor.  H/O HIV--undetectable viral load.  Admit Continue antiretroviral regimen Anticipate NSVD   [name] A 11/29/2011, 7:10 PM

## 2011-11-29 NOTE — Anesthesia Procedure Notes (Signed)
Epidural Patient location during procedure: OB Start time: 11/29/2011 6:20 PM  Staffing Anesthesiologist: Jameria Bradway A. Performed by: anesthesiologist   Preanesthetic Checklist Completed: patient identified, site marked, surgical consent, pre-op evaluation, timeout performed, IV checked, risks and benefits discussed and monitors and equipment checked  Epidural Patient position: sitting Prep: site prepped and draped and DuraPrep Patient monitoring: continuous pulse ox and blood pressure Approach: midline Injection technique: LOR air  Needle:  Needle type: Tuohy  Needle gauge: 17 G Needle length: 9 cm and 9 Needle insertion depth: 5 cm cm Catheter type: closed end flexible Catheter size: 19 Gauge Catheter at skin depth: 10 cm Test dose: negative and Other  Assessment Events: blood not aspirated, injection not painful, no injection resistance, negative IV test and no paresthesia  Additional Notes Patient identified. Risks and benefits discussed including failed block, incomplete  Pain control, post dural puncture headache, nerve damage, paralysis, blood pressure Changes, nausea, vomiting, reactions to medications-both toxic and allergic and post Partum back pain. All questions were answered. Patient expressed understanding and wished to proceed. Sterile technique was used throughout procedure. Epidural site was Dressed with sterile barrier dressing. No paresthesias, signs of intravascular injection Or signs of intrathecal spread were encountered.  Patient was more comfortable after the epidural was dosed. Please see RN's note for documentation of vital signs and FHR which are stable.

## 2011-11-30 ENCOUNTER — Encounter (HOSPITAL_COMMUNITY): Payer: Self-pay | Admitting: *Deleted

## 2011-11-30 MED ORDER — WITCH HAZEL-GLYCERIN EX PADS
1.0000 "application " | MEDICATED_PAD | CUTANEOUS | Status: DC | PRN
  Administered 2011-11-30: 1 via TOPICAL

## 2011-11-30 MED ORDER — BENZOCAINE-MENTHOL 20-0.5 % EX AERO
1.0000 "application " | INHALATION_SPRAY | CUTANEOUS | Status: DC | PRN
  Administered 2011-11-30: 1 via TOPICAL
  Filled 2011-11-30: qty 56

## 2011-11-30 MED ORDER — DIPHENHYDRAMINE HCL 25 MG PO CAPS: 25.0000 mg | ORAL_CAPSULE | Freq: Four times a day (QID) | ORAL | Status: DC | PRN

## 2011-11-30 MED ORDER — SENNOSIDES-DOCUSATE SODIUM 8.6-50 MG PO TABS
2.0000 | ORAL_TABLET | Freq: Every day | ORAL | Status: DC
  Administered 2011-11-30: 2 via ORAL

## 2011-11-30 MED ORDER — IBUPROFEN 600 MG PO TABS
600.0000 mg | ORAL_TABLET | Freq: Four times a day (QID) | ORAL | Status: DC
  Administered 2011-11-30 – 2011-12-01 (×6): 600 mg via ORAL
  Filled 2011-11-30 (×6): qty 1

## 2011-11-30 MED ORDER — TETANUS-DIPHTH-ACELL PERTUSSIS 5-2.5-18.5 LF-MCG/0.5 IM SUSP: 0.5000 mL | Freq: Once | INTRAMUSCULAR | Status: DC

## 2011-11-30 MED ORDER — OXYCODONE-ACETAMINOPHEN 5-325 MG PO TABS
1.0000 | ORAL_TABLET | ORAL | Status: DC | PRN
  Administered 2011-12-01 (×2): 1 via ORAL
  Filled 2011-11-30 (×2): qty 1

## 2011-11-30 MED ORDER — MAGNESIUM HYDROXIDE 400 MG/5ML PO SUSP: 30.0000 mL | ORAL | Status: DC | PRN

## 2011-11-30 MED ORDER — FERROUS SULFATE 325 (65 FE) MG PO TABS
325.0000 mg | ORAL_TABLET | Freq: Two times a day (BID) | ORAL | Status: DC
  Administered 2011-11-30 – 2011-12-01 (×3): 325 mg via ORAL
  Filled 2011-11-30 (×3): qty 1

## 2011-11-30 MED ORDER — MEASLES, MUMPS & RUBELLA VAC ~~LOC~~ INJ
0.5000 mL | INJECTION | Freq: Once | SUBCUTANEOUS | Status: DC
  Filled 2011-11-30: qty 0.5

## 2011-11-30 MED ORDER — ZOLPIDEM TARTRATE 5 MG PO TABS: 5.0000 mg | ORAL_TABLET | Freq: Every evening | ORAL | Status: DC | PRN

## 2011-11-30 MED ORDER — DIBUCAINE 1 % RE OINT: 1.0000 "application " | TOPICAL_OINTMENT | RECTAL | Status: DC | PRN

## 2011-11-30 MED ORDER — ONDANSETRON HCL 4 MG PO TABS: 4.0000 mg | ORAL_TABLET | ORAL | Status: DC | PRN

## 2011-11-30 MED ORDER — MEDROXYPROGESTERONE ACETATE 150 MG/ML IM SUSP: 150.0000 mg | INTRAMUSCULAR | Status: DC | PRN

## 2011-11-30 MED ORDER — ONDANSETRON HCL 4 MG/2ML IJ SOLN: 4.0000 mg | INTRAMUSCULAR | Status: DC | PRN

## 2011-11-30 MED ORDER — LANOLIN HYDROUS EX OINT: TOPICAL_OINTMENT | CUTANEOUS | Status: DC | PRN

## 2011-11-30 MED ORDER — PRENATAL MULTIVITAMIN CH
1.0000 | ORAL_TABLET | Freq: Every day | ORAL | Status: DC
  Administered 2011-11-30 – 2011-12-01 (×2): 1 via ORAL
  Filled 2011-11-30 (×2): qty 1

## 2011-11-30 NOTE — Anesthesia Postprocedure Evaluation (Signed)
  Anesthesia Post-op Note  Patient: [name]  Procedure(s) Performed: * No procedures listed *  Patient Location: PACU and Mother/Baby  Anesthesia Type: Epidural  Level of Consciousness: awake, alert  and oriented  Airway and Oxygen Therapy: Patient Spontanous Breathing  Post-op Pain: none  Post-op Assessment: Post-op Vital signs reviewed, Patient's Cardiovascular Status Stable, No headache, No backache, No residual numbness and No residual motor weakness  Post-op Vital Signs: Reviewed and stable  Complications: No apparent anesthesia complications

## 2011-11-30 NOTE — Clinical Social Work Maternal (Signed)
    Clinical Social Work Department PSYCHOSOCIAL ASSESSMENT - MATERNAL/CHILD 11/30/2011  Patient:  [name], [name]  Account Number:  [id number]  Admit Date:  11/29/2011  Marjo Bicker Name:   Margarito Liner    Clinical Social Worker:  Andy Gauss   Date/Time:  11/30/2011 12:03 PM  Date Referred:  11/30/2011   Referral source  CN     Referred reason  O42   Other referral source:    I:  FAMILY / HOME ENVIRONMENT Child's legal guardian:  PARENT  Guardian - Name Guardian - Age Guardian - Address  [name] 26 [address]; [place], [place] [address]  [name] 41 (same as above)   Other household support members/support persons Name Relationship DOB  Milford Cage DAUGHTER 06/28/07  Lyndle Herrlich SON 03/13/05   Other support:    II  PSYCHOSOCIAL DATA Information Source:  Patient Interview  Surveyor, quantity and Community Resources Employment:   Financial resources:  Medicaid If Medicaid - County:  [place] Clinical biochemist  WIC  Section 8   School / Grade:   Maternity Care Coordinator / Child Services Coordination / Early Interventions:  Cultural issues impacting care:    III  STRENGTHS Strengths  Adequate Resources  Home prepared for Child (including basic supplies)  Supportive family/friends   Strength comment:    IV  RISK FACTORS AND CURRENT PROBLEMS Current Problem:  YES   Risk Factor & Current Problem Patient Issue Family Issue Risk Factor / Current Problem Comment  Mental Illness Y N Hx of PP depression  Other - See comment Y N o42    V  SOCIAL WORK ASSESSMENT Sw met with 26 year old, G5P3, to assess current social situation, hx of PP depression and schedule infectious disease follow up appointment for the infant.  Pt remembers experiencing PP depression after the birth of her son in 2006.  She never sought treatment rather told Sw that she "just dealt with it."  Eventually, her symptoms resolved.  Pt states she was  depressed " a little," during the pregnancy due to her o42 diagnoses and loss of her son 3/12.  He was 30 1/2 months old.  She did not participate in any counseling, as she told Sw that she doesn't like talking to people.  Pt's family is not aware of her diagnoses.  Sw offered information on [employer] and grief counseling however pt declined.  FOB is aware of diagnoses and supportive, as per pt.  Pt was compliant to treatment during pregnancy.  Sw completed referral sheet to [facility] ID clinic to request an appointment.  Pt understands the importance of keeping the infants appointments and has transportation arranged through [facility].  She reports having all the necessary supplies but appears to have limited support system.  Pt appears to be appropriate and bonding well with the infant. Sw will follow up with appointment time and continue to assist as needed.      VI SOCIAL WORK PLAN Social Work Plan  No Further Intervention Required / No Barriers to Discharge   Type of pt/family education:   If child protective services report - county:   If child protective services report - date:   Information/referral to community resources comment:   [facility]   Other social work plan:

## 2011-11-30 NOTE — Progress Notes (Signed)
Patient ID: [name], female   DOB: 10-14-1985, 26 y.o.   MRN: [id number] Post Partum Day 1 S/P spontaneous vaginal RH status/Rubella reviewed.  Feeding: bottle Subjective: No HA, SOB, CP, F/C, breast symptoms. Normal vaginal bleeding, no clots.     Objective: BP 122/74  Pulse 86  Temp 98.9 F (37.2 C) (Oral)  Resp 18  SpO2 99%  LMP 02/16/2011  Breastfeeding? Unknown   Physical Exam:  General: alert Lochia: appropriate Uterine Fundus: firm DVT Evaluation: No evidence of DVT seen on physical exam. Ext: No c/c/e  Basename 11/29/11 1726  HGB 12.9  HCT 39.6      Assessment/Plan: 26 y.o.  PPD #1 .  normal postpartum exam Continue current postpartum care  Ambulate   LOS: 1 day   JACKSON-MOORE,[name] 11/30/2011, 8:07 AM

## 2011-11-30 NOTE — Progress Notes (Signed)
Ur chart review completed.  

## 2011-12-01 ENCOUNTER — Other Ambulatory Visit (HOSPITAL_COMMUNITY): Payer: Self-pay | Admitting: Student-PharmD

## 2011-12-01 MED ORDER — IBUPROFEN 600 MG PO TABS: 600.0000 mg | ORAL_TABLET | Freq: Four times a day (QID) | ORAL | Status: DC

## 2011-12-01 MED ORDER — OXYCODONE-ACETAMINOPHEN 5-325 MG PO TABS: 1.0000 | ORAL_TABLET | ORAL | Status: AC | PRN

## 2011-12-01 MED ORDER — BUPIVACAINE HCL (PF) 0.5 % IJ SOLN
INTRAMUSCULAR | Status: AC
  Filled 2011-12-01: qty 30

## 2011-12-01 NOTE — Telephone Encounter (Signed)
Entry error for documentation

## 2011-12-01 NOTE — Progress Notes (Signed)
Post Partum Day 2 Subjective: no complaints  Objective: Blood pressure 108/71, pulse 87, temperature 98.1 F (36.7 C), temperature source Oral, resp. rate 18, last menstrual period 02/16/2011, SpO2 99.00%, unknown if currently breastfeeding.  Physical Exam:  General: alert and no distress Lochia: appropriate Uterine Fundus: firm Incision: healing well DVT Evaluation: No evidence of DVT seen on physical exam.   Basename 11/29/11 1726  HGB 12.9  HCT 39.6    Assessment/Plan: Discharge home   LOS: 2 days   HARPER,CHARLES A 12/01/2011, 1:38 PM

## 2011-12-16 NOTE — Discharge Summary (Signed)
Obstetric Discharge Summary Reason for Admission: onset of labor Prenatal Procedures: ultrasound Intrapartum Procedures: spontaneous vaginal delivery Postpartum Procedures: none Complications-Operative and Postpartum: none Hemoglobin  Date Value Range Status  11/29/2011 12.9  12.0 - 15.0 g/dL Final     HCT  Date Value Range Status  11/29/2011 39.6  36.0 - 46.0 % Final    Physical Exam:  General: alert and no distress Lochia: appropriate Uterine Fundus: firm Incision: none DVT Evaluation: No evidence of DVT seen on physical exam.  Discharge Diagnoses: Term Pregnancy-delivered  Discharge Information: Date: 12/16/2011 Activity: pelvic rest Diet: routine Medications: PNV, Ibuprofen, Colace and Percocet Condition: stable Instructions: refer to practice specific booklet Discharge to: home Follow-up Information    Follow up with HARPER,CHARLES A, MD. Schedule an appointment as soon as possible for a visit in 6 weeks.   Contact information:   [address] [place] [address] [phone]          Newborn Data: Live born female  Birth Weight: 7 lb 9.9 oz (3455 g) APGAR: 9, 9  Home with mother.  HARPER,CHARLES A 12/16/2011, 4:52 AM

## 2012-01-07 ENCOUNTER — Ambulatory Visit (INDEPENDENT_AMBULATORY_CARE_PROVIDER_SITE_OTHER): Payer: Medicaid Other | Admitting: Internal Medicine

## 2012-01-07 ENCOUNTER — Encounter: Payer: Self-pay | Admitting: Internal Medicine

## 2012-01-07 VITALS — BP 110/75 | HR 83 | Temp 97.7°F | Ht 62.0 in | Wt 198.0 lb

## 2012-01-07 DIAGNOSIS — B20 Human immunodeficiency virus [HIV] disease: Secondary | ICD-10-CM

## 2012-01-11 NOTE — Assessment & Plan Note (Signed)
She is doing very well and I will have her RTC in 3 months to assure good compliance.  She knows to call if her nausea does not improve and we may consider changing her regimen.

## 2012-01-11 NOTE — Progress Notes (Signed)
  Subjective:    Patient ID: [name], female    DOB: 02-Jul-1985, 26 y.o.   MRN: [id number]  HPI Follow up for HIV, now post partum.  Baby getting care at [facility].  She continues on Reyataz, norvir, Truvada and continues with excellent compliance.  Baby with father who is involved.  Patient with no depression.  She does have some nausea over the last month that she relates to her ARVs.  It seems to be improving though.    Review of Systems  Constitutional: Negative for fever, fatigue and unexpected weight change.  Respiratory: Negative for cough and shortness of breath.   Cardiovascular: Negative for leg swelling.  Gastrointestinal: Negative for nausea, abdominal pain and diarrhea.  Musculoskeletal: Negative for myalgias and arthralgias.  Skin: Negative for rash.  Neurological: Negative for dizziness and headaches.  Psychiatric/Behavioral: Negative for dysphoric mood. The patient is not nervous/anxious.        Objective:   Physical Exam  Constitutional: She appears well-developed and well-nourished. No distress.  Cardiovascular: Normal rate, regular rhythm and normal heart sounds.  Exam reveals no gallop and no friction rub.   No murmur heard.         Assessment & Plan:

## 2012-03-08 ENCOUNTER — Other Ambulatory Visit: Payer: Self-pay | Admitting: Licensed Clinical Social Worker

## 2012-03-08 DIAGNOSIS — B20 Human immunodeficiency virus [HIV] disease: Secondary | ICD-10-CM

## 2012-03-08 MED ORDER — RITONAVIR 100 MG PO TABS: 100.0000 mg | ORAL_TABLET | Freq: Every day | ORAL | Status: DC

## 2012-03-08 MED ORDER — ATAZANAVIR SULFATE 300 MG PO CAPS: 300.0000 mg | ORAL_CAPSULE | Freq: Every day | ORAL | Status: DC

## 2012-03-08 MED ORDER — EMTRICITABINE-TENOFOVIR DF 200-300 MG PO TABS: 1.0000 | ORAL_TABLET | Freq: Every day | ORAL | Status: DC

## 2012-04-07 ENCOUNTER — Other Ambulatory Visit: Payer: Medicaid Other

## 2012-04-21 ENCOUNTER — Telehealth: Payer: Self-pay | Admitting: *Deleted

## 2012-04-21 ENCOUNTER — Ambulatory Visit: Payer: Medicaid Other | Admitting: Internal Medicine

## 2012-04-21 NOTE — Telephone Encounter (Signed)
Called and left voice mail for patient to call the clinic to reschedule her lab and MD appts., she no showed both. [name]

## 2012-06-22 ENCOUNTER — Telehealth: Payer: Self-pay | Admitting: *Deleted

## 2012-06-22 NOTE — Telephone Encounter (Signed)
Per Sam at [facility], the patient has not picked up her 042 medication refills in 3 weeks.  He has tried several times to call her - the phone is active and he has left several unreturned messages.  RN called, left a message about her medications and missed appointments in January, 2014.  Per note, baby girl is in care at [facility].  Will refer to [facility]. Andree Coss, RN

## 2012-07-18 ENCOUNTER — Encounter (HOSPITAL_COMMUNITY): Payer: Self-pay | Admitting: *Deleted

## 2012-07-18 ENCOUNTER — Emergency Department (HOSPITAL_COMMUNITY)
Admission: EM | Admit: 2012-07-18 | Discharge: 2012-07-18 | Disposition: A | Discharge status: Home / Self Care | Payer: Medicaid Other | Attending: Emergency Medicine | Admitting: Emergency Medicine

## 2012-07-18 DIAGNOSIS — J069 Acute upper respiratory infection, unspecified: Secondary | ICD-10-CM | POA: Insufficient documentation

## 2012-07-18 DIAGNOSIS — R059 Cough, unspecified: Secondary | ICD-10-CM | POA: Insufficient documentation

## 2012-07-18 DIAGNOSIS — J3489 Other specified disorders of nose and nasal sinuses: Secondary | ICD-10-CM | POA: Insufficient documentation

## 2012-07-18 DIAGNOSIS — F329 Major depressive disorder, single episode, unspecified: Secondary | ICD-10-CM | POA: Insufficient documentation

## 2012-07-18 DIAGNOSIS — F3289 Other specified depressive episodes: Secondary | ICD-10-CM | POA: Insufficient documentation

## 2012-07-18 DIAGNOSIS — H9209 Otalgia, unspecified ear: Secondary | ICD-10-CM | POA: Insufficient documentation

## 2012-07-18 DIAGNOSIS — J45909 Unspecified asthma, uncomplicated: Secondary | ICD-10-CM | POA: Insufficient documentation

## 2012-07-18 DIAGNOSIS — Z21 Asymptomatic human immunodeficiency virus [HIV] infection status: Secondary | ICD-10-CM | POA: Insufficient documentation

## 2012-07-18 DIAGNOSIS — Z8719 Personal history of other diseases of the digestive system: Secondary | ICD-10-CM | POA: Insufficient documentation

## 2012-07-18 DIAGNOSIS — J351 Hypertrophy of tonsils: Secondary | ICD-10-CM | POA: Insufficient documentation

## 2012-07-18 DIAGNOSIS — R05 Cough: Secondary | ICD-10-CM | POA: Insufficient documentation

## 2012-07-18 DIAGNOSIS — Z87891 Personal history of nicotine dependence: Secondary | ICD-10-CM | POA: Insufficient documentation

## 2012-07-18 DIAGNOSIS — J029 Acute pharyngitis, unspecified: Secondary | ICD-10-CM | POA: Insufficient documentation

## 2012-07-18 DIAGNOSIS — Z79899 Other long term (current) drug therapy: Secondary | ICD-10-CM | POA: Insufficient documentation

## 2012-07-18 MED ORDER — CETIRIZINE-PSEUDOEPHEDRINE ER 5-120 MG PO TB12: 1.0000 | ORAL_TABLET | Freq: Every day | ORAL | Status: DC

## 2012-07-18 NOTE — ED Notes (Signed)
Pt reports frontal headache, facial tenderness, sorethroat , and ear pain bilaterally.  No fever

## 2012-07-18 NOTE — ED Notes (Signed)
Pt has hx of HIV. C/O sore throat and cough. Onset 2 days ago. Tonsils enlarged. Strep screen sent to lab.

## 2012-07-18 NOTE — ED Provider Notes (Signed)
History     CSN: [id number]  Arrival date & time 07/18/12  0827   First MD Initiated Contact with Patient 07/18/12 0935      Chief Complaint  Patient presents with  . Headache  . Sore Throat  . Otalgia    (Consider location/radiation/quality/duration/timing/severity/associated sxs/prior treatment) HPI Comments: [name] is a 27 y/o F with PMHx of HIV, depression, asthma presenting to the ED headache, sore throat, otalgia, cough, nasal congestion. Patient reported that headache is more a pressure discomfort that has been ongoing since Thursday - stated that when she leans forward she feels pressure in her forehead and around her orbits with radiation to her ears. Reported having rhinorrhea and eye tearing of clear fluid starting on Friday - reported that the eye tearing has improved, but the nasal congestion continues with clear rhinorrhea. Stated that sore throat started yesterday - described as a dryness, soreness, and worse in the mornings and with swallowing. Stated that throat has gotten worse over the past couple of days. Reported cough that started yesterday, mainly a dry cough that is sporadic throughout the day with no production. Patient reported using over the counter medications without much relief. Denied fever, chills, chest pain, shortness of breathe, difficulty breathing, bodyaches, gi symptoms, urinary symptoms, neck pain, neck stiffness, back pain, visual distortions.      The history is provided by the patient. No language interpreter was used.    Past Medical History  Diagnosis Date  . HIV positive   . Asthma   . Depression     Postpartum after 1st pregnancy  . Hemorrhoids     Past Surgical History  Procedure Laterality Date  . Laparoscopy  01/06/2011    Procedure: LAPAROSCOPY OPERATIVE;  Surgeon: Roseanna Rainbow, MD;  Location: [facility];  Service: Gynecology;  Laterality: N/A;  . Laparotomy  01/06/2011    Procedure: LAPAROTOMY;  Surgeon: Roseanna Rainbow, MD;  Location: [facility];  Service: Gynecology;  Laterality: N/A;  . Dilation and curettage of uterus    . Salpingoophorectomy      Family History  Problem Relation Age of Onset  . Diabetes Mother   . Hypertension Mother   . Hyperlipidemia Mother   . Diabetes Brother   . Diabetes Maternal Aunt   . Hypertension Maternal Aunt     History  Substance Use Topics  . Smoking status: Former Smoker -- 0.50 packs/day for 7 years    Types: Cigarettes    Quit date: 03/29/2011  . Smokeless tobacco: Never Used  . Alcohol Use: No     Comment: beer last year    OB History   Grav Para Term Preterm Abortions TAB SAB Ect Mult Living   5 4 3 1 1  0 1 0 0 3      Review of Systems  Constitutional: Negative for fever and chills.  HENT: Positive for ear pain, congestion and sore throat. Negative for trouble swallowing, neck pain, neck stiffness, postnasal drip and tinnitus.   Eyes: Negative for pain and visual disturbance.  Respiratory: Positive for cough. Negative for chest tightness, shortness of breath and wheezing.   Cardiovascular: Negative for chest pain.  Gastrointestinal: Negative for nausea, vomiting, abdominal pain and diarrhea.  Genitourinary: Negative for decreased urine volume and difficulty urinating.  Skin: Negative for rash.  Neurological: Negative for dizziness, weakness, light-headedness, numbness and headaches.  All other systems reviewed and are negative.    Allergies  Review of patient's allergies indicates no known  allergies.  Home Medications   Current Outpatient Rx  Name  Route  Sig  Dispense  Refill  . atazanavir (REYATAZ) 300 MG capsule   Oral   Take 1 capsule (300 mg total) by mouth daily with breakfast.   30 capsule   3   . emtricitabine-tenofovir (TRUVADA) 200-300 MG per tablet   Oral   Take 1 tablet by mouth daily.   30 tablet   3   . ritonavir (NORVIR) 100 MG TABS   Oral   Take 1 tablet (100 mg total) by mouth daily with breakfast.    30 tablet   3   . cetirizine-pseudoephedrine (ZYRTEC-D) 5-120 MG per tablet   Oral   Take 1 tablet by mouth daily.   30 tablet   0     BP 122/85  Pulse 98  Temp(Src) 98.5 F (36.9 C) (Oral)  Resp 16  SpO2 99%  Physical Exam  Nursing note and vitals reviewed. Constitutional: She is oriented to person, place, and time. She appears well-developed and well-nourished. No distress.  HENT:  Head: Normocephalic and atraumatic.  Right Ear: External ear normal.  Left Ear: External ear normal.  Nose: Nose normal.  Mouth/Throat: Oropharynx is clear and moist. No oropharyngeal exudate.  Uvula midline, symmetrical elevation Tonsils enlarged bilaterally - negative erythema, exudate, petechiae Negative tonsillar abscess noted  Ears: TMs positive light reflex bilaterally. Mild erythema noted to left TM without bulging, fluid accumulation, inflammation or swelling to the ear canal. Ear canal of right ear without swelling, erythema, inflammation.   Eyes: Conjunctivae and EOM are normal. Pupils are equal, round, and reactive to light. Right eye exhibits no discharge. Left eye exhibits no discharge.  Neck: Normal range of motion. Neck supple. No tracheal deviation present.  Cardiovascular: Normal rate and regular rhythm.  Exam reveals no friction rub.   No murmur heard. Radial pulses 2+ bilaterally  Pulmonary/Chest: Effort normal and breath sounds normal. No respiratory distress. She has no wheezes. She has no rales. She exhibits no tenderness.  Lymphadenopathy:    She has no cervical adenopathy.  Neurological: She is alert and oriented to person, place, and time. No cranial nerve deficit. She exhibits normal muscle tone. Coordination normal.  Skin: Skin is warm and dry. No rash noted. She is not diaphoretic. No erythema.  Psychiatric: She has a normal mood and affect. Her behavior is normal. Thought content normal.    ED Course  Procedures (including critical care time)  Labs Reviewed   RAPID STREP SCREEN   No results found.   1. URI (upper respiratory infection)   2. Sore throat   3. HIV positive   4. Asthma       MDM  I personally examined and evaluated the patient. Rapid strep test negative. Patient afebrile, non-tachycardic, normotensive, alert and oriented. Discussed case with Dr. Ranae Palms - did not recommend labs - recommended patient to follow-up with PCP for re-check, continue with zyrtec. Patient aseptic, non-toxic appearing, in no acute distress. Discharged patient. Suspected URI - viral in nature and reported that no antibiotics used - just supportive therapy mainly, can use lozenges over the counter and hydrate to aid in sore throat. Discharged with zyrtec. Recommended patient to follow-up with PCP, recommended to get viral loads checked since patient is feeling sick. Discussed with patient to follow-up with [facility]. Discussed to continue taking at home medications as prescribed. Discussed with patient to monitor symptoms and if symptoms are to worsen or change to report  back to the ED. Patient agreed to plan of care, understood, all questions answered.          Raymon Mutton, PA-C 07/18/12 1848  Raymon Mutton, PA-C 07/19/12 [phone]

## 2012-07-18 NOTE — ED Notes (Signed)
Pt tonsils are very enlarged, no drooling

## 2012-07-19 NOTE — ED Provider Notes (Signed)
Medical screening examination/treatment/procedure(s) were performed by non-physician practitioner and as supervising physician I was immediately available for consultation/collaboration.   Giancarlo Askren, MD 07/19/12 1744 

## 2012-09-13 ENCOUNTER — Telehealth: Payer: Self-pay | Admitting: *Deleted

## 2012-09-13 NOTE — Telephone Encounter (Signed)
Message left requesting pt call for appts.

## 2012-10-03 ENCOUNTER — Encounter: Payer: Self-pay | Admitting: *Deleted

## 2012-10-25 IMAGING — US US OB COMP LESS 14 WK
1 series · 14 of 18 positions shown · non-contrast
Comparison: None this gestation.

for ectopic pregnancy, presenting with left lower quadrant
abdominal pain and uncertain dates. Estimated LMP 02/16/2011 ([DATE]
weeks 2 days).  Quantitative beta HCG pending.

OBSTETRIC <14 WK ULTRASOUND 04/22/2011:
TECHNIQUE: Transabdominal ultrasound was performed for evaluation
of the gestation as well as the maternal uterus and adnexal
regions.

[Series 1: us ob comp less 14 wks · 18 acquisitions, 14 frames shown]
[im 1/18]
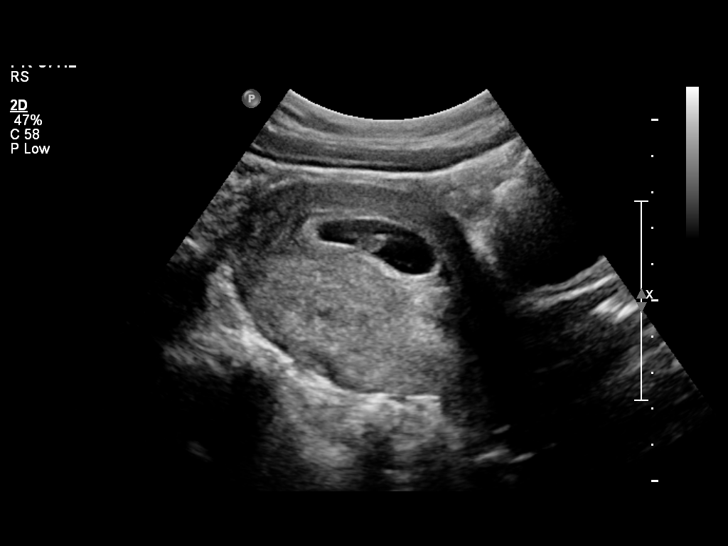
[im 2/18]
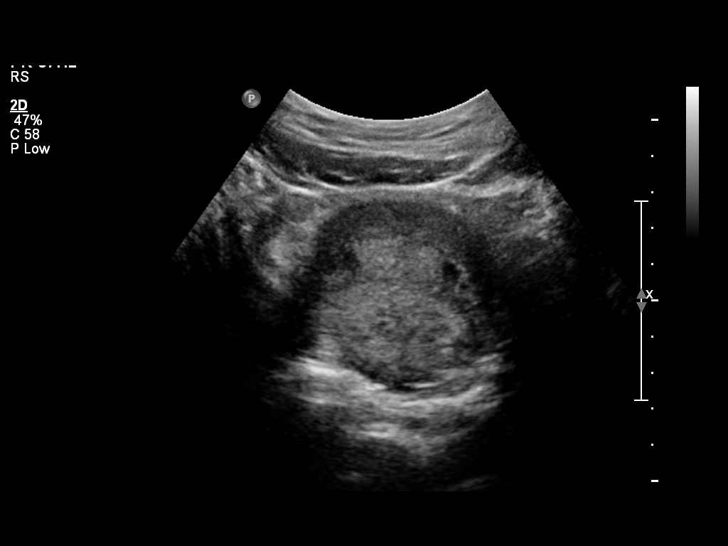
[im 4/18]
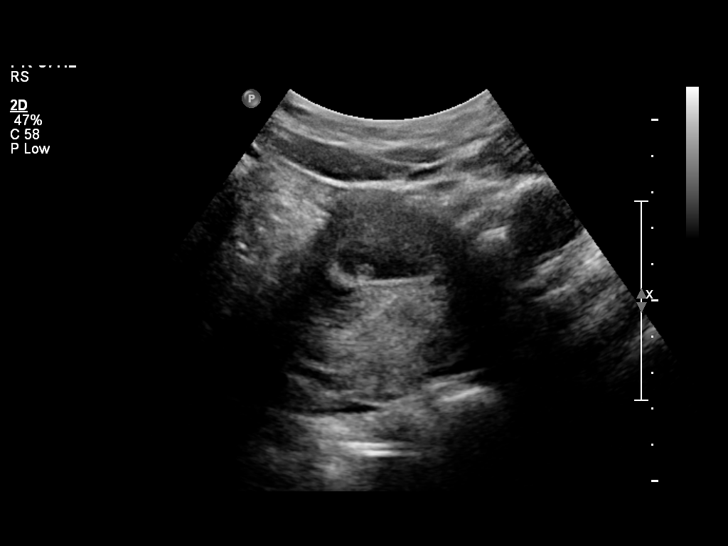
[im 5/18]
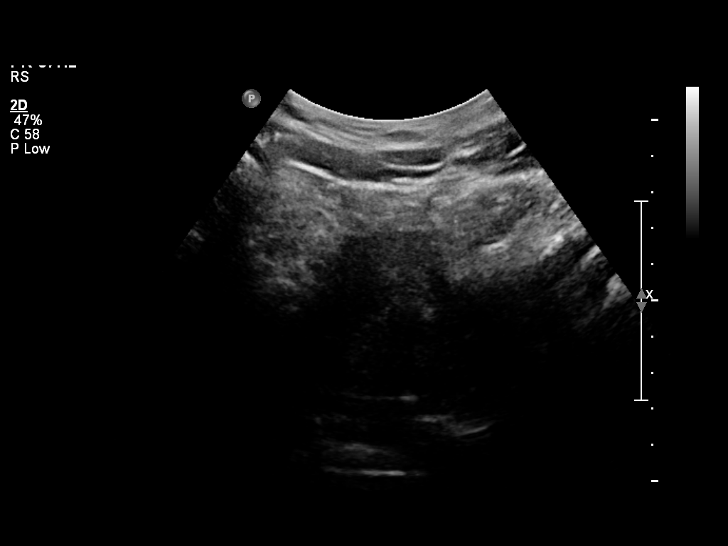
[im 6/18]
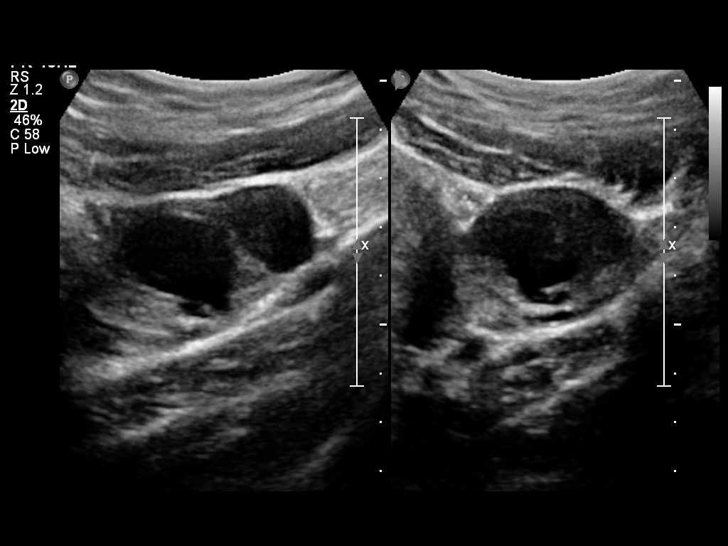
[im 8/18]
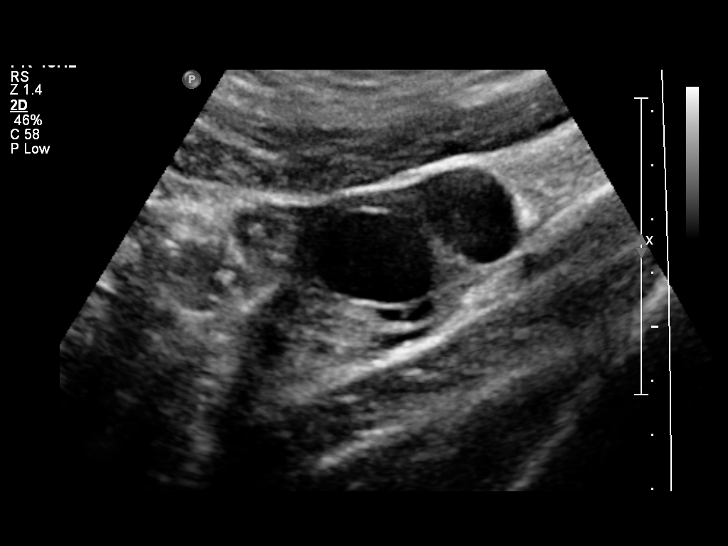
[im 9/18]
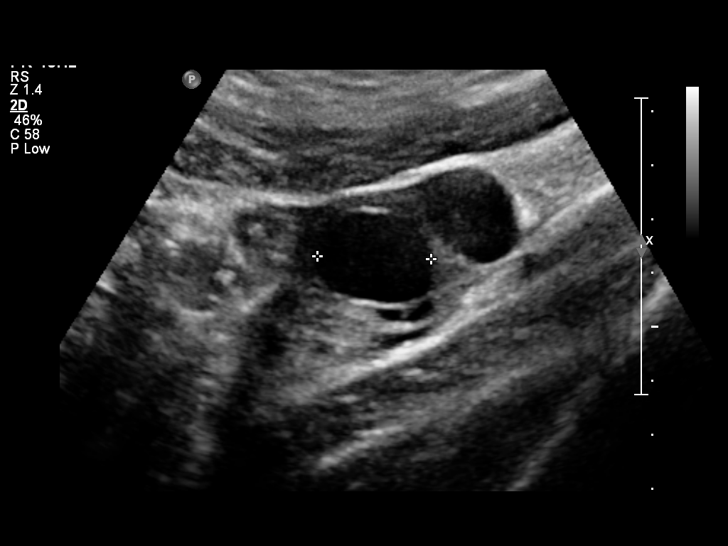
[im 10/18]
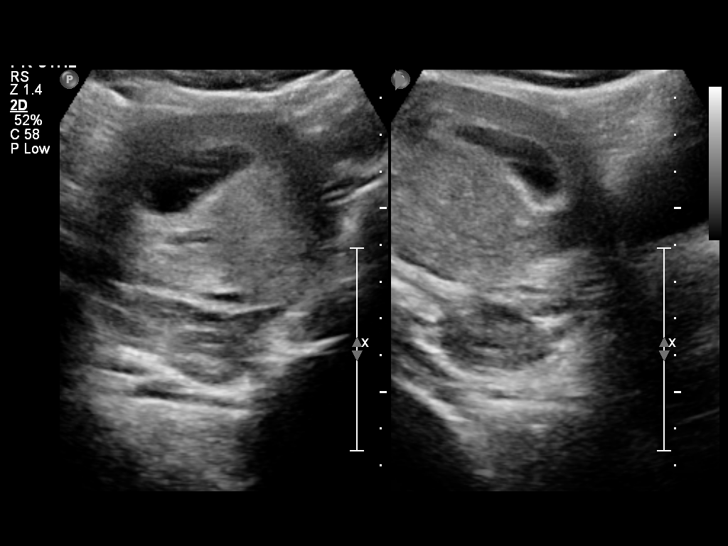
[im 11/18]
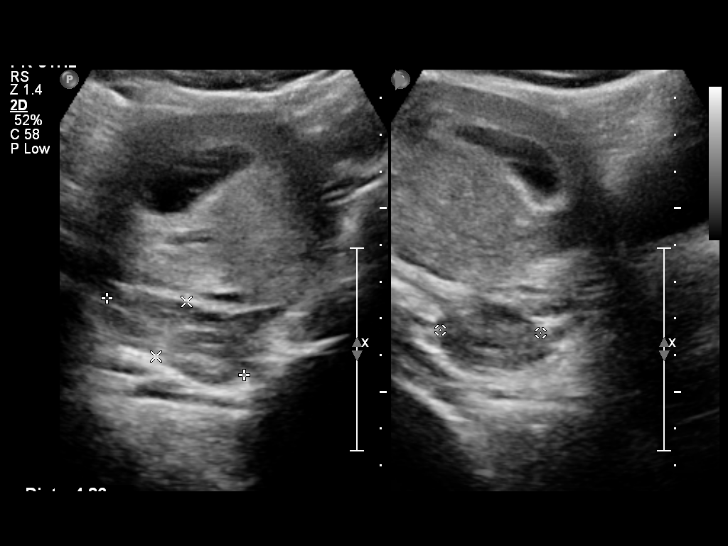
[im 13/18]
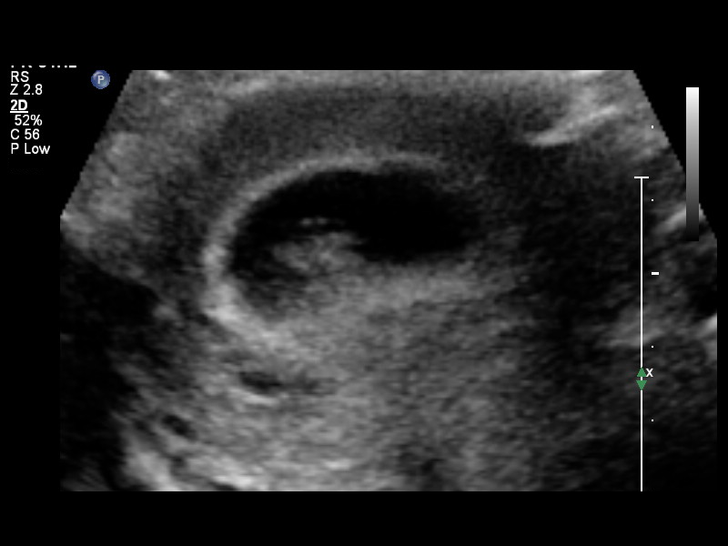
[im 14/18]
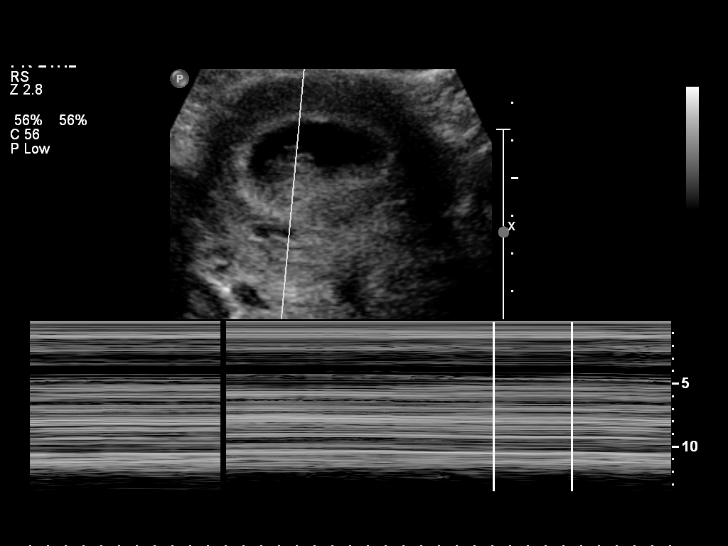
[im 15/18]
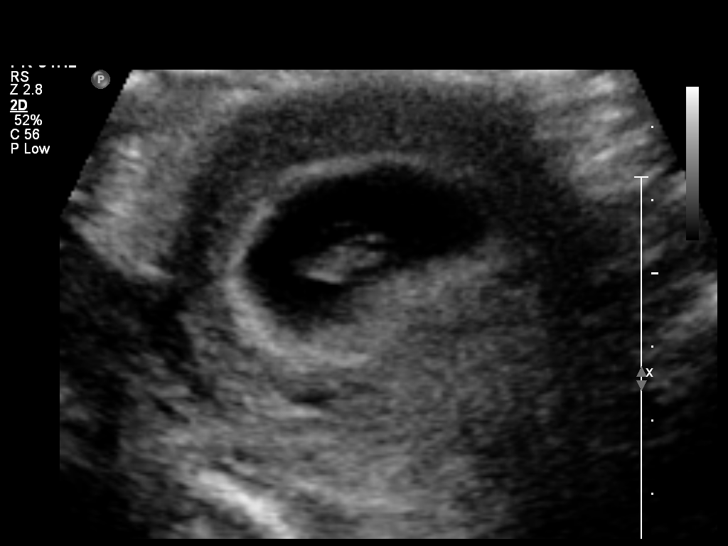
[im 17/18]
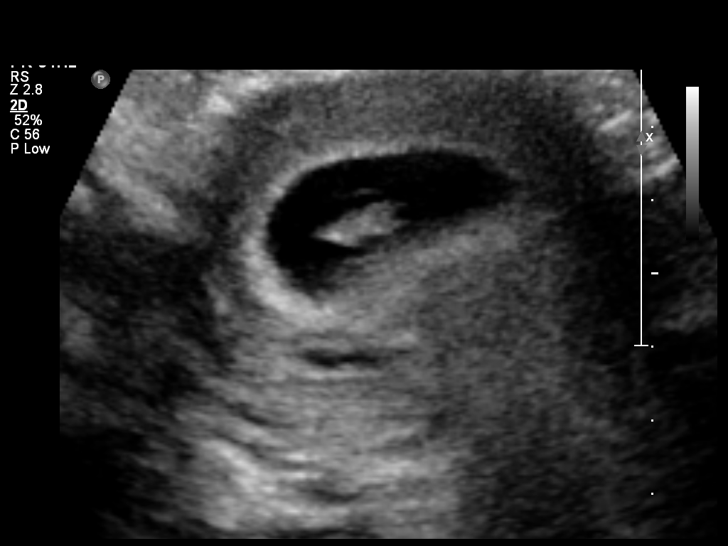
[im 18/18]
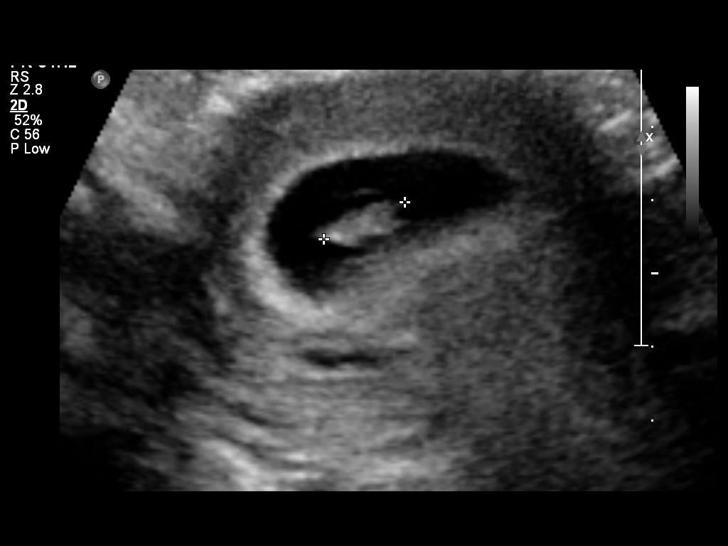

[14 of 18 positions shown; findings below may reference images not displayed]

Intrauterine gestational sac: Visualized/normal in shape.
Yolk sac: Visualized.
Embryo: Visualized.
Cardiac Activity: Visualized.
Heart Rate: 136 bpm

CRL:  12 mm  7w  3d           US EDC: 12/06/2011

Maternal uterus/Adnexae:
No evidence of subchorionic hemorrhage.  Left ovary upper normal in
size measuring approximately 4.9 x 2.7 x 3.5 cm, containing an
approximate 2 cm likely corpus luteum cyst.  Right ovary normal in
size measuring approximately 4.3 x 1.7 x 2.7 cm, without dominant
cysts.  No solid adnexal masses or free pelvic fluid.
IMPRESSION: 1.  Single live intrauterine embryo with estimated gestational age
7 weeks 3 days by crown-rump length.  Ultrasound EDC 12/06/2011.
2.  2 cm likely corpus luteum cyst within the left ovary.

## 2012-11-28 ENCOUNTER — Ambulatory Visit: Payer: Medicaid Other

## 2012-11-28 ENCOUNTER — Telehealth: Payer: Self-pay | Admitting: *Deleted

## 2012-11-28 NOTE — Telephone Encounter (Signed)
Message left requesting pt to call [facility] for new PAP smear appt.

## 2013-01-16 ENCOUNTER — Other Ambulatory Visit: Payer: Medicaid Other

## 2013-01-16 ENCOUNTER — Ambulatory Visit: Payer: Medicaid Other

## 2013-01-26 ENCOUNTER — Other Ambulatory Visit: Payer: Self-pay

## 2013-01-30 ENCOUNTER — Ambulatory Visit: Payer: Medicaid Other | Admitting: Internal Medicine

## 2013-01-30 ENCOUNTER — Telehealth: Payer: Self-pay | Admitting: *Deleted

## 2013-01-30 NOTE — Telephone Encounter (Signed)
Attempted to call patient regarding today's no show appt, unable to leave voice mail. Patient responded via text message confirming today's appt. [name]

## 2013-02-23 ENCOUNTER — Telehealth: Payer: Self-pay | Admitting: *Deleted

## 2013-02-23 ENCOUNTER — Ambulatory Visit: Payer: Medicaid Other | Admitting: Internal Medicine

## 2013-02-23 NOTE — Telephone Encounter (Signed)
Called patient regarding todays appt, 3rd no show and explained about the walk in clinic. [name]

## 2013-02-28 ENCOUNTER — Ambulatory Visit (INDEPENDENT_AMBULATORY_CARE_PROVIDER_SITE_OTHER): Payer: Medicaid Other | Admitting: Internal Medicine

## 2013-02-28 ENCOUNTER — Ambulatory Visit: Payer: Medicaid Other | Admitting: Internal Medicine

## 2013-02-28 ENCOUNTER — Encounter: Payer: Self-pay | Admitting: Internal Medicine

## 2013-02-28 VITALS — BP 118/82 | HR 96 | Temp 97.5°F | Ht 62.0 in | Wt 196.0 lb

## 2013-02-28 DIAGNOSIS — Z113 Encounter for screening for infections with a predominantly sexual mode of transmission: Secondary | ICD-10-CM

## 2013-02-28 DIAGNOSIS — Z23 Encounter for immunization: Secondary | ICD-10-CM

## 2013-02-28 DIAGNOSIS — Z79899 Other long term (current) drug therapy: Secondary | ICD-10-CM

## 2013-02-28 DIAGNOSIS — B20 Human immunodeficiency virus [HIV] disease: Secondary | ICD-10-CM

## 2013-02-28 MED ORDER — EMTRICITABINE-TENOFOVIR DF 200-300 MG PO TABS: 1.0000 | ORAL_TABLET | Freq: Every day | ORAL | Status: DC

## 2013-02-28 MED ORDER — DOLUTEGRAVIR SODIUM 50 MG PO TABS: 50.0000 mg | ORAL_TABLET | Freq: Every day | ORAL | Status: DC

## 2013-03-02 NOTE — Assessment & Plan Note (Signed)
Not on meds and obviously not controlled.  Last CD4 was 670.  I will change to Tivicay and Truvada.  Patient reminded to call if there are any issues and to get labs in one month.

## 2013-03-02 NOTE — Progress Notes (Signed)
   Subjective:    Patient ID: Rozanna Box, female    DOB: 1985/04/26, 26 y.o.   MRN: [id number]  HPI Comes in for follow up after multiple missed appts.  She was last seen 1 year ago and was going to be on Reyataz, norvir and truvada.  She tells me she started for about 1 month but due to nausea with the medicaiton, she stopped it and never returned. No calls, no follow up.     Review of Systems  Constitutional: Negative for fatigue.  Eyes: Negative for visual disturbance.  Gastrointestinal: Negative for nausea and diarrhea.  Skin: Negative for rash.  Neurological: Negative for dizziness and light-headedness.  Psychiatric/Behavioral: Negative for dysphoric mood.       Objective:   Physical Exam  Constitutional: She appears well-developed and well-nourished. No distress.  HENT:  Mouth/Throat: No oropharyngeal exudate.  Eyes: No scleral icterus.  Cardiovascular: Normal rate, regular rhythm and normal heart sounds.   No murmur heard. Pulmonary/Chest: Effort normal and breath sounds normal. No respiratory distress. She has no wheezes.  Lymphadenopathy:    She has no cervical adenopathy.  Skin: No rash noted.  Psychiatric: She has a normal mood and affect.          Assessment & Plan:

## 2013-03-06 ENCOUNTER — Other Ambulatory Visit: Payer: Self-pay | Admitting: Internal Medicine

## 2013-03-06 ENCOUNTER — Telehealth: Payer: Self-pay | Admitting: *Deleted

## 2013-03-06 ENCOUNTER — Other Ambulatory Visit: Payer: Self-pay | Admitting: *Deleted

## 2013-03-06 DIAGNOSIS — B20 Human immunodeficiency virus [HIV] disease: Secondary | ICD-10-CM

## 2013-03-06 MED ORDER — ABACAVIR SULFATE-LAMIVUDINE 600-300 MG PO TABS: 1.0000 | ORAL_TABLET | Freq: Every day | ORAL | Status: DC

## 2013-03-06 MED ORDER — ONDANSETRON HCL 4 MG PO TABS: 4.0000 mg | ORAL_TABLET | Freq: Three times a day (TID) | ORAL | Status: DC | PRN

## 2013-03-06 NOTE — Telephone Encounter (Signed)
Sent rx to [facility], notified patient. Thanks!

## 2013-03-06 NOTE — Telephone Encounter (Signed)
Give her tivicay with Epzicom, 1 tab daily. Thanks.

## 2013-03-06 NOTE — Telephone Encounter (Signed)
And zofran 4 mg as needed, # 30, thanks

## 2013-03-06 NOTE — Telephone Encounter (Signed)
Pt reports nausea with Truvada, states that she "has been taking it for more than a year and it's not working." Pt says the Tivicay is fine, wants to know if she can take it alone. RN told her that Tivicay wouldn't be a complete regimen, stated would send the question to Dr. Luciana Axe for advice as to wether she should change regimens or add a emdication for nausea.  Please advise. Andree Coss, RN

## 2013-05-01 ENCOUNTER — Other Ambulatory Visit: Payer: Self-pay | Admitting: Internal Medicine

## 2013-05-01 DIAGNOSIS — R110 Nausea: Secondary | ICD-10-CM

## 2013-05-02 ENCOUNTER — Telehealth: Payer: Self-pay | Admitting: *Deleted

## 2013-05-02 NOTE — Telephone Encounter (Signed)
Pt requested refill of zofran - authorized #30, no refills.  Patient was supposed to follow up in January d/t regimen change.  Left message asking her to call and schedule an appointment. Andree CossHowell, Daronte Shostak M, RN

## 2013-05-30 ENCOUNTER — Other Ambulatory Visit: Payer: Self-pay | Admitting: Internal Medicine

## 2013-06-08 ENCOUNTER — Other Ambulatory Visit: Payer: Self-pay | Admitting: *Deleted

## 2013-06-08 ENCOUNTER — Encounter: Payer: Self-pay | Admitting: *Deleted

## 2013-06-08 DIAGNOSIS — R110 Nausea: Secondary | ICD-10-CM

## 2013-06-08 MED ORDER — ONDANSETRON HCL 4 MG PO TABS: ORAL_TABLET | ORAL | Status: DC

## 2013-06-08 NOTE — Telephone Encounter (Signed)
PT'S VOICE MAIL BOX IS FULL.  UNABLE TO LEAVE MESSAGE.  Will send her a letter.

## 2013-06-21 ENCOUNTER — Encounter: Payer: Self-pay | Admitting: *Deleted

## 2013-07-01 ENCOUNTER — Encounter (HOSPITAL_COMMUNITY): Payer: Self-pay | Admitting: Emergency Medicine

## 2013-07-01 ENCOUNTER — Emergency Department (HOSPITAL_COMMUNITY)
Admission: EM | Admit: 2013-07-01 | Discharge: 2013-07-02 | Disposition: A | Discharge status: Home / Self Care | Payer: Medicaid Other | Attending: Emergency Medicine | Admitting: Emergency Medicine

## 2013-07-01 DIAGNOSIS — Z8659 Personal history of other mental and behavioral disorders: Secondary | ICD-10-CM | POA: Insufficient documentation

## 2013-07-01 DIAGNOSIS — Z87891 Personal history of nicotine dependence: Secondary | ICD-10-CM | POA: Insufficient documentation

## 2013-07-01 DIAGNOSIS — J45909 Unspecified asthma, uncomplicated: Secondary | ICD-10-CM | POA: Insufficient documentation

## 2013-07-01 DIAGNOSIS — Z21 Asymptomatic human immunodeficiency virus [HIV] infection status: Secondary | ICD-10-CM | POA: Insufficient documentation

## 2013-07-01 DIAGNOSIS — IMO0002 Reserved for concepts with insufficient information to code with codable children: Secondary | ICD-10-CM | POA: Insufficient documentation

## 2013-07-01 DIAGNOSIS — Z79899 Other long term (current) drug therapy: Secondary | ICD-10-CM | POA: Insufficient documentation

## 2013-07-01 DIAGNOSIS — K644 Residual hemorrhoidal skin tags: Secondary | ICD-10-CM | POA: Insufficient documentation

## 2013-07-01 NOTE — ED Notes (Signed)
Pt here for c/o Hemorid.

## 2013-07-01 NOTE — ED Notes (Signed)
Pt c/o hemorrhoid exacerbation.

## 2013-07-02 MED ORDER — DOCUSATE SODIUM 100 MG PO CAPS: 100.0000 mg | ORAL_CAPSULE | Freq: Two times a day (BID) | ORAL | Status: DC

## 2013-07-02 MED ORDER — HYDROCORTISONE 1 % EX CREA: TOPICAL_CREAM | CUTANEOUS | Status: DC

## 2013-07-02 NOTE — ED Provider Notes (Signed)
Medical screening examination/treatment/procedure(s) were performed by non-physician practitioner and as supervising physician I was immediately available for consultation/collaboration.   EKG Interpretation None        Mardy Lucier M Malik Paar, MD 07/02/13 0716 

## 2013-07-02 NOTE — ED Provider Notes (Signed)
CSN: [id number]     Arrival date & time 07/01/13  2247 History   First MD Initiated Contact with Patient 07/02/13 0015     Chief Complaint  Patient presents with  . Hemorrhoids     (Consider location/radiation/quality/duration/timing/severity/associated sxs/prior Treatment) HPI Comments: Patient is a [age] year old female the past medical history of hemorrhoids, HIV positive, asthma and depression who presents to the emergency department complaining of an exacerbation of her hemorrhoids over the past few days. Patient states her hemorrhoids "act up" whenever she eats spicy foods. She has been sitting in warm baths with only mild relief. Denies blood on the toilet tissue or bloody stools. Denies abdominal pain, fever, chills, nausea or vomiting.  The history is provided by the patient.    Past Medical History  Diagnosis Date  . HIV positive   . Asthma   . Depression     Postpartum after 1st pregnancy  . Hemorrhoids    Past Surgical History  Procedure Laterality Date  . Laparoscopy  01/06/2011    Procedure: LAPAROSCOPY OPERATIVE;  Surgeon: Roseanna RainbowLisa A Jackson-Moore, MD;  Location: [facility];  Service: Gynecology;  Laterality: N/A;  . Laparotomy  01/06/2011    Procedure: LAPAROTOMY;  Surgeon: Roseanna RainbowLisa A Jackson-Moore, MD;  Location: [facility];  Service: Gynecology;  Laterality: N/A;  . Dilation and curettage of uterus    . Salpingoophorectomy     Family History  Problem Relation Age of Onset  . Diabetes Mother   . Hypertension Mother   . Hyperlipidemia Mother   . Diabetes Brother   . Diabetes Maternal Aunt   . Hypertension Maternal Aunt    History  Substance Use Topics  . Smoking status: Former Smoker -- 0.50 packs/day for 7 years    Types: Cigarettes    Quit date: 03/29/2011  . Smokeless tobacco: Never Used  . Alcohol Use: No   OB History   Grav Para Term Preterm Abortions TAB SAB Ect Mult Living   5 4 3 1 1  0 1 0 0 3     Review of Systems  Gastrointestinal: Positive for rectal  pain.  All other systems reviewed and are negative.     Allergies  Review of patient's allergies indicates no known allergies.  Home Medications   Current Outpatient Rx  Name  Route  Sig  Dispense  Refill  . docusate sodium (COLACE) 100 MG capsule   Oral   Take 1 capsule (100 mg total) by mouth every 12 (twelve) hours.   60 capsule   0   . hydrocortisone cream 1 %      Apply to affected area 2 times daily   15 g   0    BP 120/77  Pulse 105  Temp(Src) 98.5 F (36.9 C) (Oral)  Resp 20  Ht 5\' 4"  (1.626 m)  Wt 192 lb (87.091 kg)  BMI 32.94 kg/m2  SpO2 99%  LMP 05/31/2013 Physical Exam  Nursing note and vitals reviewed. Constitutional: She is oriented to person, place, and time. She appears well-developed and well-nourished. No distress.  HENT:  Head: Normocephalic and atraumatic.  Mouth/Throat: Oropharynx is clear and moist.  Eyes: Conjunctivae are normal.  Neck: Normal range of motion. Neck supple.  Cardiovascular: Normal rate, regular rhythm and normal heart sounds.   Pulmonary/Chest: Effort normal and breath sounds normal.  Abdominal: Soft. Bowel sounds are normal. There is no tenderness.  Genitourinary: Rectal exam shows external hemorrhoid (one medium sized hemorrhoid at around 8:00, no bleeding or thrombosis, few  small hemorrhoids, no bleeding or thrombosis).  Musculoskeletal: Normal range of motion. She exhibits no edema.  Neurological: She is alert and oriented to person, place, and time.  Skin: Skin is warm and dry. She is not diaphoretic.  Psychiatric: She has a normal mood and affect. Her behavior is normal.    ED Course  Procedures (including critical care time) Labs Review Labs Reviewed - No data to display Imaging Review No results found.   EKG Interpretation None      MDM   Final diagnoses:  External hemorrhoids   Treat with preparation H., advised continued sitz baths. No bleeding or thrombosis. Patient is in no apparent distress. No  tachycardia on my exam. Stable for discharge. F/u with PCP. Return precautions given. Patient states understanding of treatment care plan and is agreeable.    Trevor Mace, PA-C 07/02/13 0112

## 2013-07-02 NOTE — Discharge Instructions (Signed)
Apply hydrocortisone cream twice daily to the hemorrhoids. Continue to sit in warm baths.  Hemorrhoids Hemorrhoids are swollen veins around the rectum or anus. There are two types of hemorrhoids:   Internal hemorrhoids. These occur in the veins just inside the rectum. They may poke through to the outside and become irritated and painful.  External hemorrhoids. These occur in the veins outside the anus and can be felt as a painful swelling or hard lump near the anus. CAUSES  Pregnancy.   Obesity.   Constipation or diarrhea.   Straining to have a bowel movement.   Sitting for long periods on the toilet.  Heavy lifting or other activity that caused you to strain.  Anal intercourse. SYMPTOMS   Pain.   Anal itching or irritation.   Rectal bleeding.   Fecal leakage.   Anal swelling.   One or more lumps around the anus.  DIAGNOSIS  Your caregiver may be able to diagnose hemorrhoids by visual examination. Other examinations or tests that may be performed include:   Examination of the rectal area with a gloved hand (digital rectal exam).   Examination of anal canal using a small tube (scope).   A blood test if you have lost a significant amount of blood.  A test to look inside the colon (sigmoidoscopy or colonoscopy). TREATMENT Most hemorrhoids can be treated at home. However, if symptoms do not seem to be getting better or if you have a lot of rectal bleeding, your caregiver may perform a procedure to help make the hemorrhoids get smaller or remove them completely. Possible treatments include:   Placing a rubber band at the base of the hemorrhoid to cut off the circulation (rubber band ligation).   Injecting a chemical to shrink the hemorrhoid (sclerotherapy).   Using a tool to burn the hemorrhoid (infrared light therapy).   Surgically removing the hemorrhoid (hemorrhoidectomy).   Stapling the hemorrhoid to block blood flow to the tissue (hemorrhoid  stapling).  HOME CARE INSTRUCTIONS   Eat foods with fiber, such as whole grains, beans, nuts, fruits, and vegetables. Ask your doctor about taking products with added fiber in them (fibersupplements).  Increase fluid intake. Drink enough water and fluids to keep your urine clear or pale yellow.   Exercise regularly.   Go to the bathroom when you have the urge to have a bowel movement. Do not wait.   Avoid straining to have bowel movements.   Keep the anal area dry and clean. Use wet toilet paper or moist towelettes after a bowel movement.   Medicated creams and suppositories may be used or applied as directed.   Only take over-the-counter or prescription medicines as directed by your caregiver.   Take warm sitz baths for 15 20 minutes, 3 4 times a day to ease pain and discomfort.   Place ice packs on the hemorrhoids if they are tender and swollen. Using ice packs between sitz baths may be helpful.   Put ice in a plastic bag.   Place a towel between your skin and the bag.   Leave the ice on for 15 20 minutes, 3 4 times a day.   Do not use a donut-shaped pillow or sit on the toilet for long periods. This increases blood pooling and pain.  SEEK MEDICAL CARE IF:  You have increasing pain and swelling that is not controlled by treatment or medicine.  You have uncontrolled bleeding.  You have difficulty or you are unable to have a bowel movement.  You have pain or inflammation outside the area of the hemorrhoids. MAKE SURE YOU:  Understand these instructions.  Will watch your condition.  Will get help right away if you are not doing well or get worse. Document Released: 03/06/2000 Document Revised: 02/24/2012 Document Reviewed: 01/12/2012 [facility] Patient Information 2014 [place], [place].  Fiber Content in Foods Drinking plenty of fluids and consuming foods high in fiber can help with constipation. See the list below for the fiber content of some common  foods. Starches and Grains / Dietary Fiber (g)  Cheerios, 1 cup / 3 g  Kellogg's Corn Flakes, 1 cup / 0.7 g  Rice Krispies, 1  cup / 0.3 g  Quaker Oat Life Cereal,  cup / 2.1 g  Oatmeal, instant (cooked),  cup / 2 g  Kellogg's Frosted Mini Wheats, 1 cup / 5.1 g  Rice, brown, long-grain (cooked), 1 cup / 3.5 g  Rice, white, long-grain (cooked), 1 cup / 0.6 g  Macaroni, cooked, enriched, 1 cup / 2.5 g Legumes / Dietary Fiber (g)  Beans, baked, canned, plain or vegetarian,  cup / 5.2 g  Beans, kidney, canned,  cup / 6.8 g  Beans, pinto, dried (cooked),  cup / 7.7 g  Beans, pinto, canned,  cup / 5.5 g Breads and Crackers / Dietary Fiber (g)  Graham crackers, plain or honey, 2 squares / 0.7 g  Saltine crackers, 3 squares / 0.3 g  Pretzels, plain, salted, 10 pieces / 1.8 g  Bread, whole-wheat, 1 slice / 1.9 g  Bread, white, 1 slice / 0.7 g  Bread, raisin, 1 slice / 1.2 g  Bagel, plain, 3 oz / 2 g  Tortilla, flour, 1 oz / 0.9 g  Tortilla, corn, 1 small / 1.5 g  Bun, hamburger or hotdog, 1 small / 0.9 g Fruits / Dietary Fiber (g)  Apple, raw with skin, 1 medium / 4.4 g  Applesauce, sweetened,  cup / 1.5 g  Banana,  medium / 1.5 g  Grapes, 10 grapes / 0.4 g  Orange, 1 small / 2.3 g  Raisin, 1.5 oz / 1.6 g  Melon, 1 cup / 1.4 g Vegetables / Dietary Fiber (g)  Green beans, canned,  cup / 1.3 g  Carrots (cooked),  cup / 2.3 g  Broccoli (cooked),  cup / 2.8 g  Peas, frozen (cooked),  cup / 4.4 g  Potatoes, mashed,  cup / 1.6 g  Lettuce, 1 cup / 0.5 g  Corn, canned,  cup / 1.6 g  Tomato,  cup / 1.1 g Document Released: 07/26/2006 Document Revised: 06/01/2011 Document Reviewed: 09/20/2006 ExitCare Patient Information 2014 [place], [place].

## 2013-07-05 ENCOUNTER — Other Ambulatory Visit: Payer: Self-pay | Admitting: Internal Medicine

## 2013-07-12 ENCOUNTER — Other Ambulatory Visit: Payer: Self-pay | Admitting: *Deleted

## 2013-07-12 MED ORDER — ONDANSETRON HCL 4 MG PO TABS: 4.0000 mg | ORAL_TABLET | Freq: Three times a day (TID) | ORAL | Status: DC | PRN

## 2013-07-26 ENCOUNTER — Other Ambulatory Visit: Payer: Self-pay | Admitting: Internal Medicine

## 2013-07-28 ENCOUNTER — Other Ambulatory Visit: Payer: Self-pay | Admitting: Licensed Clinical Social Worker

## 2013-07-28 MED ORDER — ONDANSETRON HCL 4 MG PO TABS: 4.0000 mg | ORAL_TABLET | Freq: Three times a day (TID) | ORAL | Status: DC | PRN

## 2013-08-30 ENCOUNTER — Other Ambulatory Visit: Payer: Self-pay | Admitting: Internal Medicine

## 2013-09-01 ENCOUNTER — Other Ambulatory Visit: Payer: Self-pay | Admitting: Internal Medicine

## 2013-09-01 ENCOUNTER — Other Ambulatory Visit: Payer: Self-pay | Admitting: *Deleted

## 2013-09-01 MED ORDER — ABACAVIR SULFATE-LAMIVUDINE 600-300 MG PO TABS: 1.0000 | ORAL_TABLET | Freq: Every day | ORAL | Status: DC

## 2013-09-01 MED ORDER — DOLUTEGRAVIR SODIUM 50 MG PO TABS: 50.0000 mg | ORAL_TABLET | Freq: Every day | ORAL | Status: DC

## 2013-10-02 ENCOUNTER — Other Ambulatory Visit: Payer: Medicaid Other

## 2013-10-03 ENCOUNTER — Telehealth: Payer: Self-pay | Admitting: *Deleted

## 2013-10-03 DIAGNOSIS — B20 Human immunodeficiency virus [HIV] disease: Secondary | ICD-10-CM

## 2013-10-03 MED ORDER — EMTRICITABINE-TENOFOVIR DF 200-300 MG PO TABS: 1.0000 | ORAL_TABLET | Freq: Every day | ORAL | Status: DC

## 2013-10-03 NOTE — Telephone Encounter (Signed)
RN reviewed Dr. Ephriam Knucklesomer's last office note.  Per Dr. Ephriam Knucklesomer's office note, Dec. 2014, the patient's regimen is Tivicay and Truvada.  Verbal order for Truvada given to [facility] and verbalized back.  Pharmacist to speak with the pt and confirm pt knowledge of Tivicay/Truvada regimen.  Discontinue Epzicom per Dr. Ephriam Knucklesomer's office note.  RN conferred with Gay FillerM. Pham re: Dr. Ephriam Knucklesomer's note.

## 2013-10-05 ENCOUNTER — Other Ambulatory Visit (HOSPITAL_COMMUNITY)
Admission: RE | Admit: 2013-10-05 | Discharge: 2013-10-05 | Disposition: A | Discharge status: Home / Self Care | Payer: Medicaid Other | Source: Ambulatory Visit | Attending: Internal Medicine | Admitting: Internal Medicine

## 2013-10-05 ENCOUNTER — Other Ambulatory Visit: Payer: Medicaid Other

## 2013-10-05 DIAGNOSIS — Z113 Encounter for screening for infections with a predominantly sexual mode of transmission: Secondary | ICD-10-CM | POA: Diagnosis present

## 2013-10-05 DIAGNOSIS — B20 Human immunodeficiency virus [HIV] disease: Secondary | ICD-10-CM

## 2013-10-05 DIAGNOSIS — Z79899 Other long term (current) drug therapy: Secondary | ICD-10-CM

## 2013-10-05 LAB — LIPID PANEL
CHOLESTEROL: 152 mg/dL (ref 0–200)
HDL: 42 mg/dL (ref >39)
LDL Cholesterol: 93 mg/dL (ref 0–99)
TRIGLYCERIDES: 84 mg/dL (ref <150)
Total CHOL/HDL Ratio: 3.6 Ratio
VLDL: 17 mg/dL (ref 0–40)

## 2013-10-05 LAB — COMPLETE METABOLIC PANEL WITH GFR
ALK PHOS: 28 U/L — AB (ref 39–117)
ALT: 11 U/L (ref 0–35)
AST: 13 U/L (ref 0–37)
Albumin: 3.7 g/dL (ref 3.5–5.2)
BILIRUBIN TOTAL: 0.3 mg/dL (ref 0.2–1.2)
BUN: 11 mg/dL (ref 6–23)
CO2: 25 meq/L (ref 19–32)
CREATININE: 0.79 mg/dL (ref 0.50–1.10)
Calcium: 9.1 mg/dL (ref 8.4–10.5)
Chloride: 104 mEq/L (ref 96–112)
GLUCOSE: 84 mg/dL (ref 70–99)
Potassium: 4 mEq/L (ref 3.5–5.3)
Sodium: 137 mEq/L (ref 135–145)
TOTAL PROTEIN: 6.9 g/dL (ref 6.0–8.3)

## 2013-10-05 LAB — CBC WITH DIFFERENTIAL/PLATELET
BASOS PCT: 0 % (ref 0–1)
Basophils Absolute: 0 10*3/uL (ref 0.0–0.1)
Eosinophils Absolute: 0.1 10*3/uL (ref 0.0–0.7)
Eosinophils Relative: 2 % (ref 0–5)
HCT: 38 % (ref 36.0–46.0)
HEMOGLOBIN: 12.7 g/dL (ref 12.0–15.0)
LYMPHS PCT: 30 % (ref 12–46)
Lymphs Abs: 1.9 10*3/uL (ref 0.7–4.0)
MCH: 29.4 pg (ref 26.0–34.0)
MCHC: 33.4 g/dL (ref 30.0–36.0)
MCV: 88 fL (ref 78.0–100.0)
MONOS PCT: 5 % (ref 3–12)
Monocytes Absolute: 0.3 10*3/uL (ref 0.1–1.0)
NEUTROS ABS: 4 10*3/uL (ref 1.7–7.7)
NEUTROS PCT: 63 % (ref 43–77)
Platelets: 275 10*3/uL (ref 150–400)
RBC: 4.32 MIL/uL (ref 3.87–5.11)
RDW: 14.2 % (ref 11.5–15.5)
WBC: 6.4 10*3/uL (ref 4.0–10.5)

## 2013-10-05 LAB — RPR

## 2013-10-06 LAB — HIV-1 RNA QUANT-NO REFLEX-BLD
HIV 1 RNA Quant: <20 {copies}/mL (ref <20)
HIV-1 RNA Quant, Log: <1.3 {Log} (ref <1.30)

## 2013-10-06 LAB — T-HELPER CELL (CD4) - (RCID CLINIC ONLY)
CD4 % Helper T Cell: 30 % — ABNORMAL LOW (ref 33–55)
CD4 T Cell Abs: 570 /uL (ref 400–2700)

## 2013-10-11 LAB — HLA B*5701: HLA-B*5701 w/rflx HLA-B High: NEGATIVE

## 2013-10-13 ENCOUNTER — Ambulatory Visit (INDEPENDENT_AMBULATORY_CARE_PROVIDER_SITE_OTHER): Payer: Medicaid Other | Admitting: *Deleted

## 2013-10-13 ENCOUNTER — Other Ambulatory Visit (HOSPITAL_COMMUNITY)
Admission: RE | Admit: 2013-10-13 | Discharge: 2013-10-13 | Disposition: A | Discharge status: Home / Self Care | Payer: Medicaid Other | Source: Ambulatory Visit | Attending: Internal Medicine | Admitting: Internal Medicine

## 2013-10-13 DIAGNOSIS — Z01419 Encounter for gynecological examination (general) (routine) without abnormal findings: Secondary | ICD-10-CM | POA: Diagnosis present

## 2013-10-13 DIAGNOSIS — Z124 Encounter for screening for malignant neoplasm of cervix: Secondary | ICD-10-CM

## 2013-10-13 NOTE — Patient Instructions (Signed)
Your results will be ready in about a week.  I will mail them to you.  Thank you for coming to the Center for your care.  Denise,  RN 

## 2013-10-13 NOTE — Progress Notes (Signed)
  Subjective:     [name] is a 28 y.o. woman who comes in today for a  pap smear only.  Previous abnormal Pap smears: no. Contraception: condoms.  Would like a pregnancy test.  LMP 09/29/13.  Pt states that the "Orange pill" she is taking for HIV is causing diarrhea, nausea, headache.  Wanted Dr. Luciana Axeomer to know this.  Objective:    There were no vitals taken for this visit. Pelvic Exam:  Pap smear obtained.   Assessment:    Screening pap smear.   Plan:    Follow up in one year, or as indicated by Pap results.   Pt given educational materials re: HIV and women, self-esteem, BSE, nutrition and diet management, PAP smears and partner safety. Pt given condoms.

## 2013-10-16 LAB — CYTOLOGY - PAP

## 2013-10-20 ENCOUNTER — Encounter: Payer: Self-pay | Admitting: *Deleted

## 2013-10-25 ENCOUNTER — Emergency Department (HOSPITAL_COMMUNITY)
Admission: EM | Admit: 2013-10-25 | Discharge: 2013-10-25 | Disposition: A | Discharge status: Home / Self Care | Payer: Medicaid Other | Attending: Emergency Medicine | Admitting: Emergency Medicine

## 2013-10-25 ENCOUNTER — Encounter (HOSPITAL_COMMUNITY): Payer: Self-pay | Admitting: Emergency Medicine

## 2013-10-25 DIAGNOSIS — L293 Anogenital pruritus, unspecified: Secondary | ICD-10-CM | POA: Insufficient documentation

## 2013-10-25 DIAGNOSIS — Z21 Asymptomatic human immunodeficiency virus [HIV] infection status: Secondary | ICD-10-CM | POA: Insufficient documentation

## 2013-10-25 DIAGNOSIS — Z8619 Personal history of other infectious and parasitic diseases: Secondary | ICD-10-CM | POA: Diagnosis not present

## 2013-10-25 DIAGNOSIS — Z8659 Personal history of other mental and behavioral disorders: Secondary | ICD-10-CM | POA: Diagnosis not present

## 2013-10-25 DIAGNOSIS — Z87891 Personal history of nicotine dependence: Secondary | ICD-10-CM | POA: Insufficient documentation

## 2013-10-25 DIAGNOSIS — Z79899 Other long term (current) drug therapy: Secondary | ICD-10-CM | POA: Diagnosis not present

## 2013-10-25 DIAGNOSIS — N390 Urinary tract infection, site not specified: Secondary | ICD-10-CM | POA: Insufficient documentation

## 2013-10-25 DIAGNOSIS — J45909 Unspecified asthma, uncomplicated: Secondary | ICD-10-CM | POA: Insufficient documentation

## 2013-10-25 LAB — WET PREP, GENITAL
Trich, Wet Prep: NONE SEEN
Yeast Wet Prep HPF POC: NONE SEEN

## 2013-10-25 LAB — URINALYSIS, ROUTINE W REFLEX MICROSCOPIC
Glucose, UA: NEGATIVE mg/dL
Ketones, ur: 40 mg/dL — AB
Nitrite: NEGATIVE
Protein, ur: 30 mg/dL — AB
SPECIFIC GRAVITY, URINE: 1.028 (ref 1.005–1.030)
Urobilinogen, UA: 1 mg/dL (ref 0.0–1.0)
pH: 6 (ref 5.0–8.0)

## 2013-10-25 LAB — URINE MICROSCOPIC-ADD ON

## 2013-10-25 LAB — GC/CHLAMYDIA PROBE AMP
CT PROBE, AMP APTIMA: NEGATIVE
GC Probe RNA: NEGATIVE

## 2013-10-25 LAB — RPR

## 2013-10-25 MED ORDER — NITROFURANTOIN MONOHYD MACRO 100 MG PO CAPS
100.0000 mg | ORAL_CAPSULE | Freq: Two times a day (BID) | ORAL | Status: DC
  Filled 2013-10-25 (×2): qty 1

## 2013-10-25 MED ORDER — NITROFURANTOIN MONOHYD MACRO 100 MG PO CAPS: 100.0000 mg | ORAL_CAPSULE | Freq: Two times a day (BID) | ORAL | Status: DC

## 2013-10-25 MED ORDER — NITROFURANTOIN MONOHYD MACRO 100 MG PO CAPS
100.0000 mg | ORAL_CAPSULE | Freq: Once | ORAL | Status: DC
  Administered 2013-10-25: 100 mg via ORAL

## 2013-10-25 NOTE — ED Provider Notes (Signed)
CSN: [id number]     Arrival date & time 10/25/13  0245 History   First MD Initiated Contact with Patient 10/25/13 [phone]     Chief Complaint  Patient presents with  . Vaginal Itching     (Consider location/radiation/quality/duration/timing/severity/associated sxs/prior Treatment) HPI  [name] is a 28 y.o. female presents with a 3 day hx of vaginal itching. She thought it was a yeast infection, purchased OTC cream, but that did not work. She has dysuria, but she denies any discharge or hematuria. She has had a UTI in the past but does not feel this is the same. She also had an STI before and was treated. HIV +  Past Medical History  Diagnosis Date  . HIV positive   . Asthma   . Depression     Postpartum after 1st pregnancy  . Hemorrhoids    Past Surgical History  Procedure Laterality Date  . Laparoscopy  01/06/2011    Procedure: LAPAROSCOPY OPERATIVE;  Surgeon: Roseanna Rainbow, MD;  Location: [facility];  Service: Gynecology;  Laterality: N/A;  . Laparotomy  01/06/2011    Procedure: LAPAROTOMY;  Surgeon: Roseanna Rainbow, MD;  Location: [facility];  Service: Gynecology;  Laterality: N/A;  . Dilation and curettage of uterus    . Salpingoophorectomy     Family History  Problem Relation Age of Onset  . Diabetes Mother   . Hypertension Mother   . Hyperlipidemia Mother   . Kidney disease Mother   . Diabetes Brother   . Diabetes Maternal Aunt   . Hypertension Maternal Aunt   . Asthma Daughter    History  Substance Use Topics  . Smoking status: Former Smoker -- 0.50 packs/day for 7 years    Types: Cigarettes    Quit date: 03/29/2011  . Smokeless tobacco: Never Used  . Alcohol Use: No   OB History   Grav Para Term Preterm Abortions TAB SAB Ect Mult Living   5 4 3 1 1  0 1 0 0 3     Review of Systems  Constitutional: Negative for fever and chills.  Respiratory: Negative for cough and shortness of breath.   Cardiovascular: Negative for chest pain and leg swelling.   Gastrointestinal: Negative for nausea, vomiting and abdominal pain.  Genitourinary: Positive for dysuria. Negative for hematuria, vaginal bleeding, vaginal discharge and pelvic pain.       Vaginal itching  Neurological: Negative for dizziness and weakness.      Allergies  Review of patient's allergies indicates no known allergies.  Home Medications   Prior to Admission medications   Medication Sig Start Date End Date Taking? Authorizing Provider  abacavir-lamiVUDine (EPZICOM) 600-300 MG per tablet Take 1 tablet by mouth daily. 09/01/13   Gardiner Barefoot, MD  docusate sodium (COLACE) 100 MG capsule Take 1 capsule (100 mg total) by mouth every 12 (twelve) hours. 07/02/13   Trevor Mace, PA-C  dolutegravir (TIVICAY) 50 MG tablet Take 1 tablet (50 mg total) by mouth daily. 09/01/13   Gardiner Barefoot, MD  emtricitabine-tenofovir (TRUVADA) 200-300 MG per tablet Take 1 tablet by mouth daily. 10/03/13   Gardiner Barefoot, MD  hydrocortisone cream 1 % Apply to affected area 2 times daily 07/02/13   Trevor Mace, PA-C  nitrofurantoin, macrocrystal-monohydrate, (MACROBID) 100 MG capsule Take 1 capsule (100 mg total) by mouth 2 (two) times daily. 10/25/13   Earle Gell Anan Dapolito, PA-C  ondansetron (ZOFRAN) 4 MG tablet Take 1 tablet (4 mg total) by  mouth every 8 (eight) hours as needed for nausea or vomiting. 07/28/13   Gardiner Barefootobert W Comer, MD  TIVICAY 50 MG tablet TAKE 1 TABLET (50 MG TOTAL) BY MOUTH DAILY. 09/01/13   Gardiner Barefootobert W Comer, MD   BP 110/60  Pulse 79  Temp(Src) 97.8 F (36.6 C) (Oral)  Resp 19  SpO2 99% Physical Exam  Constitutional: She is oriented to person, place, and time. She appears well-developed and well-nourished. No distress.  HENT:  Head: Normocephalic and atraumatic.  Eyes: Conjunctivae and EOM are normal.  Neck: Normal range of motion.  Cardiovascular: Normal rate, regular rhythm and normal heart sounds.   Pulmonary/Chest: Effort normal and breath sounds normal.  Abdominal: Soft. She  exhibits no distension. There is no tenderness. There is no rebound and no guarding.  Genitourinary:  No apparent lesions or dermatitis of the vagina. Bi manual exam yielded no appreciable ovarian abnormalities, there was mild tenderness to the L adnexa  Musculoskeletal: Normal range of motion.  Neurological: She is alert and oriented to person, place, and time.  Skin: Skin is warm and dry.    ED Course  Procedures (including critical care time) Labs Review Labs Reviewed  WET PREP, GENITAL - Abnormal; Notable for the following:    Clue Cells Wet Prep HPF POC FEW (*)    WBC, Wet Prep HPF POC FEW (*)    All other components within normal limits  URINALYSIS, ROUTINE W REFLEX MICROSCOPIC - Abnormal; Notable for the following:    Color, Urine AMBER (*)    APPearance CLOUDY (*)    Hgb urine dipstick TRACE (*)    Bilirubin Urine MODERATE (*)    Ketones, ur 40 (*)    Protein, ur 30 (*)    Leukocytes, UA LARGE (*)    All other components within normal limits  URINE MICROSCOPIC-ADD ON - Abnormal; Notable for the following:    Bacteria, UA FEW (*)    All other components within normal limits  GC/CHLAMYDIA PROBE AMP  RPR    Imaging Review No results found.   EKG Interpretation None      MDM  Based on presentation of symptoms and urinalysis results, will treat with Macrobid for UTI. History and Physical exam not concerning for PID, vaginitis at this time. F/U with PCP or return to ED if symptoms do not improve after antibiotic course, or symptoms become worse. Final diagnoses:  UTI (lower urinary tract infection)   Meds given in ED:  Medications - No data to display  Discharge Medication List as of 10/25/2013  5:59 AM          Sharlene MottsBenjamin W Emelie Newsom, PA-C 10/26/13 [phone]

## 2013-10-25 NOTE — ED Provider Notes (Signed)
History of HIV, history of prior Chlamydia who presents with a complaint of vaginal discharge and itching.  Has tried Monistat without improvement, associated mild dysuria but no abdominal discomfort fevers chills nausea or vomiting. She has one sexual partner  On exam the patient is a soft abdomen, clear heart and lung sounds and appears to be in no distress. Genital exam CHAPERONED BY NURSE and deferred to physician assistant. STD testing pending.  Medical screening examination/treatment/procedure(s) were conducted as a shared visit with non-physician practitioner(s) and myself.  I personally evaluated the patient during the encounter.  Clinical Impression: UTI      Vida RollerBrian D Jaivyn Gulla, MD 10/26/13 2000

## 2013-10-25 NOTE — ED Notes (Signed)
Pt states that she has been having vaginal irritation. Pt states that there is no discharge, nor any burning, but she is irritated.

## 2013-10-25 NOTE — Discharge Instructions (Signed)
Antibiotic Medication Antibiotics are among the most frequently prescribed medicines. Antibiotics cure illness by assisting our body to injure or kill the bacteria that cause infection. While antibiotics are useful to treat a wide variety of infections they are useless against viruses. Antibiotics cannot cure colds, flu, or other viral infections.  There are many types of antibiotics available. Your caregiver will decide which antibiotic will be useful for an illness. Never take or give someone else's antibiotics or left over medicine. Your caregiver may also take into account:  Allergies.  The cost of the medicine.  Dosing schedules.  Taste.  Common side effects when choosing an antibiotic for an infection. Ask your caregiver if you have questions about why a certain medicine was chosen. HOME CARE INSTRUCTIONS Read all instructions and labels on medicine bottles carefully. Some antibiotics should be taken on an empty stomach while others should be taken with food. Taking antibiotics incorrectly may reduce how well they work. Some antibiotics need to be kept in the refrigerator. Others should be kept at room temperature. Ask your caregiver or pharmacist if you do not understand how to give the medicine. Be sure to give the amount of medicine your caregiver has prescribed. Even if you feel better and your symptoms improve, bacteria may still remain alive in the body. Taking all of the medicine will prevent:  The infection from returning and becoming harder to treat.  Complications from partially treated infections. If there is any medicine left over after you have taken the medicine as your caregiver has instructed, throw the medicine away. Be sure to tell your caregiver if you:  Are allergic to any medicines.  Are pregnant or intend to become pregnant while using this medicine.  Are breastfeeding.  Are taking any other prescription, non-prescription medicine, or herbal  remedies.  Have any other medical conditions or problems you have not already discussed. If you are taking birth control pills, they may not work while you are on antibiotics. To avoid unwanted pregnancy:  Continue taking your birth control pills as usual.  Use a second form of birth control (such as condoms) while you are taking antibiotic medicine.  When you finish taking the antibiotic medicine, continue using the second form of birth control until you are finished with your current 1 month cycle of birth control pills. Try not to miss any doses of medicine. If you miss a dose, take it as soon as possible. However, if it is almost time for the next dose and the dosing schedule is:  2 doses a day, take the missed dose and the next dose 5 to 6 hours apart.  3 or more doses a day, take the missed dose and the next dose 2 to 4 hours apart, then go back to the normal schedule.  If you are unable to make up a missed dose, take the next scheduled dose on time and complete the missed dose at the end of the prescribed time for your medicine. SIDE EFFECTS TO TAKING ANTIBIOTICS Common side effects to antibiotic use include:  Soft stools or diarrhea.  Mild stomach upset.  Sun sensitivity. SEEK MEDICAL CARE IF:   If you get worse or do not improve within a few days of starting the medicine.  Vomiting develops.  Diaper rash or rash on the genitals appears.  Vaginal itching occurs.  White patches appear on the tongue or in the mouth.  Severe watery diarrhea and abdominal cramps occur.  Signs of an allergy develop (hives, unknown  itchy rash appears). STOP TAKING THE ANTIBIOTIC. SEEK IMMEDIATE MEDICAL CARE IF:   Urine turns dark or blood colored.  Skin turns yellow.  Easy bruising or bleeding occurs.  Joint pain or muscle aches occur.  Fever returns.  Severe headache occurs.  Signs of an allergy develop (trouble breathing, wheezing, swelling of the lips, face or tongue,  fainting, or blisters on the skin or in the mouth). STOP TAKING THE ANTIBIOTIC. Document Released: 11/20/2003 Document Revised: 06/01/2011 Document Reviewed: 11/29/2008 [facility] Patient Information 2015 [employer] [place]. This information is not intended to replace advice given to you by your health care provider. Make sure you discuss any questions you have with your health care provider.    If symptoms do not improve after antibiotic therapy, symptoms get worse, or you have an allergic reaction to the medication, return to ED for further evaluation.

## 2013-10-25 NOTE — ED Notes (Signed)
Obtained verbal order for wet prep from Jew[place]Ben, [place]C.

## 2013-10-26 NOTE — ED Provider Notes (Signed)
Medical screening examination/treatment/procedure(s) were conducted as a shared visit with non-physician practitioner(s) and myself.  I personally evaluated the patient during the encounter  Please see my separate respective documentation pertaining to this patient encounter   Vida RollerBrian D Kamyra Schroeck, MD 10/26/13 2000

## 2013-11-01 ENCOUNTER — Ambulatory Visit: Payer: Medicaid Other | Admitting: Internal Medicine

## 2013-11-21 ENCOUNTER — Ambulatory Visit: Payer: Medicaid Other | Admitting: Internal Medicine

## 2013-11-22 ENCOUNTER — Telehealth: Payer: Self-pay | Admitting: *Deleted

## 2013-11-22 NOTE — Telephone Encounter (Signed)
Left patient a voice mail to call the clinic regarding her no show appointment yesterday. Patient needs to be seen in the walk in clinic due to multiple no shows. Advised her to call before coming in to be sure we have an available provider. [name]

## 2013-12-21 ENCOUNTER — Telehealth: Payer: Self-pay | Admitting: *Deleted

## 2013-12-21 NOTE — Telephone Encounter (Signed)
[facility] not able to contact pt to deliver rxes.  Will send the pt a MyChart message letting her know to contact [facility].

## 2014-01-05 ENCOUNTER — Other Ambulatory Visit: Payer: Self-pay

## 2014-01-16 ENCOUNTER — Telehealth: Payer: Self-pay | Admitting: *Deleted

## 2014-01-16 ENCOUNTER — Other Ambulatory Visit: Payer: Self-pay | Admitting: Internal Medicine

## 2014-01-16 DIAGNOSIS — B20 Human immunodeficiency virus [HIV] disease: Secondary | ICD-10-CM

## 2014-01-16 NOTE — Telephone Encounter (Signed)
Tivicay request received from [facility].  Patient last  office visit 02/2013, multiple no shows, multiple unsuccessful attempts to contact patient. Last labs 09/2013, was undetectable, has been signing monthly for her medications.  Authorized 1 month. Pt needs to be seen in walk in clinic for additional refills. M-Thurs, 9-11 and 2-4. Asked pharmacy to please let her know.

## 2014-01-22 ENCOUNTER — Encounter (HOSPITAL_COMMUNITY): Payer: Self-pay | Admitting: Emergency Medicine

## 2014-01-23 ENCOUNTER — Emergency Department (HOSPITAL_COMMUNITY)
Admission: EM | Admit: 2014-01-23 | Discharge: 2014-01-23 | Disposition: A | Discharge status: Home / Self Care | Payer: Medicaid Other | Attending: Emergency Medicine | Admitting: Emergency Medicine

## 2014-01-23 ENCOUNTER — Encounter (HOSPITAL_COMMUNITY): Payer: Self-pay

## 2014-01-23 DIAGNOSIS — M545 Low back pain, unspecified: Secondary | ICD-10-CM

## 2014-01-23 DIAGNOSIS — Z8719 Personal history of other diseases of the digestive system: Secondary | ICD-10-CM | POA: Diagnosis not present

## 2014-01-23 DIAGNOSIS — Z21 Asymptomatic human immunodeficiency virus [HIV] infection status: Secondary | ICD-10-CM | POA: Insufficient documentation

## 2014-01-23 DIAGNOSIS — Z8659 Personal history of other mental and behavioral disorders: Secondary | ICD-10-CM | POA: Diagnosis not present

## 2014-01-23 DIAGNOSIS — J45909 Unspecified asthma, uncomplicated: Secondary | ICD-10-CM | POA: Diagnosis not present

## 2014-01-23 DIAGNOSIS — Z87891 Personal history of nicotine dependence: Secondary | ICD-10-CM | POA: Insufficient documentation

## 2014-01-23 DIAGNOSIS — Z79899 Other long term (current) drug therapy: Secondary | ICD-10-CM | POA: Insufficient documentation

## 2014-01-23 DIAGNOSIS — Z792 Long term (current) use of antibiotics: Secondary | ICD-10-CM | POA: Insufficient documentation

## 2014-01-23 MED ORDER — HYDROCODONE-ACETAMINOPHEN 5-325 MG PO TABS: 1.0000 | ORAL_TABLET | ORAL | Status: DC | PRN

## 2014-01-23 MED ORDER — CYCLOBENZAPRINE HCL 10 MG PO TABS: 10.0000 mg | ORAL_TABLET | Freq: Two times a day (BID) | ORAL | Status: DC | PRN
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
## 2014-01-23 NOTE — Discharge Instructions (Signed)
Muscle Strain  A muscle strain is an injury that occurs when a muscle is stretched beyond its normal length. Usually a small number of muscle fibers are torn when this happens. Muscle strain is rated in degrees. First-degree strains have the least amount of muscle fiber tearing and pain. Second-degree and third-degree strains have increasingly more tearing and pain.   Usually, recovery from muscle strain takes 1-2 weeks. Complete healing takes 5-6 weeks.   CAUSES   Muscle strain happens when a sudden, violent force placed on a muscle stretches it too far. This may occur with lifting, sports, or a fall.   RISK FACTORS  Muscle strain is especially common in athletes.   SIGNS AND SYMPTOMS  At the site of the muscle strain, there may be:   Pain.   Bruising.   Swelling.   Difficulty using the muscle due to pain or lack of normal function.  DIAGNOSIS   Your health care provider will perform a physical exam and ask about your medical history.  TREATMENT   Often, the best treatment for a muscle strain is resting, icing, and applying cold compresses to the injured area.   HOME CARE INSTRUCTIONS    Use the PRICE method of treatment to promote muscle healing during the first 2-3 days after your injury. The PRICE method involves:   Protecting the muscle from being injured again.   Restricting your activity and resting the injured body part.   Icing your injury. To do this, put ice in a plastic bag. Place a towel between your skin and the bag. Then, apply the ice and leave it on from 15-20 minutes each hour. After the third day, switch to moist heat packs.   Apply compression to the injured area with a splint or elastic bandage. Be careful not to wrap it too tightly. This may interfere with blood circulation or increase swelling.   Elevate the injured body part above the level of your heart as often as you can.   Only take over-the-counter or prescription medicines for pain, discomfort, or fever as directed by your  health care provider.   Warming up prior to exercise helps to prevent future muscle strains.  SEEK MEDICAL CARE IF:    You have increasing pain or swelling in the injured area.   You have numbness, tingling, or a significant loss of strength in the injured area.  MAKE SURE YOU:    Understand these instructions.   Will watch your condition.   Will get help right away if you are not doing well or get worse.  Document Released: 03/09/2005 Document Revised: 12/28/2012 Document Reviewed: 10/06/2012  ExitCare Patient Information 2015 [employer]. This information is not intended to replace advice given to you by your health care provider. Make sure you discuss any questions you have with your health care provider.    Back Pain, Adult  Low back pain is very common. About 1 in 5 people have back pain.The cause of low back pain is rarely dangerous. The pain often gets better over time.About half of people with a sudden onset of back pain feel better in just 2 weeks. About 8 in 10 people feel better by 6 weeks.   CAUSES  Some common causes of back pain include:   Strain of the muscles or ligaments supporting the spine.   Wear and tear (degeneration) of the spinal discs.   Arthritis.   Direct injury to the back.  DIAGNOSIS  Most of the time, the   of your pain is not dangerous. If your caregiver needs more information, he or she may order lab work or imaging tests (X-rays or MRIs).However, even if imaging tests show changes in your back, this usually does not require surgery. HOME CARE INSTRUCTIONS For many people, back pain returns.Since low back pain is rarely dangerous, it is often a condition that people can learn to [facility] their own.     Remain active. It is stressful on the back to sit or stand in one place. Do not sit, drive, or stand in one place for more than 30 minutes at a time. Take short walks on level surfaces as soon as pain allows.Try to increase the length of time you walk each day.  Do not stay in bed.Resting more than 1 or 2 days can delay your recovery.  Do not avoid exercise or work.Your body is made to move.It is not dangerous to be active, even though your back may hurt.Your back will likely heal faster if you return to being active before your pain is gone.  Pay attention to your body when you bend and lift. Many people have less discomfortwhen lifting if they bend their knees, keep the load close to their bodies,and avoid twisting. Often, the most comfortable positions are those that put less stress on your recovering back.  Find a comfortable position to sleep. Use a firm mattress and lie on your side with your knees slightly bent. If you lie on your back, put a pillow under your knees.  Only take over-the-counter or prescription medicines as directed by your caregiver. Over-the-counter medicines to reduce pain and inflammation are often the most helpful.Your caregiver may prescribe muscle relaxant drugs.These medicines help dull your pain so you can more quickly return to your normal activities and healthy exercise.  Put ice on the injured area.  Put ice in a plastic bag.  Place a towel between your skin and the bag.  Leave the ice on for 15-20 minutes, 03-04 times a day for the first 2 to 3 days. After that, ice and heat may be alternated to reduce pain and spasms.  Ask your caregiver about trying back exercises and gentle massage. This may be of some benefit.  Avoid feeling anxious or stressed.Stress increases muscle tension and can worsen back pain.It is important to recognize when you are anxious or stressed and learn ways to manage it.Exercise is a great option. SEEK MEDICAL CARE  IF:  You have pain that is not relieved with rest or medicine.  You have pain that does not improve in 1 week.  You have new symptoms.  You are generally not feeling well. SEEK IMMEDIATE MEDICAL CARE IF:   You have pain that radiates from your back into your legs.  You develop new bowel or bladder control problems.  You have unusual weakness or numbness in your arms or legs.  You develop nausea or vomiting.  You develop abdominal pain.  You feel faint. Document Released: 03/09/2005 Document Revised: 09/08/2011 Document Reviewed: 07/11/2013 [facility] Patient Information 2015 [place], [place]. This information is not intended to replace advice given to you by your health care provider. Make sure you discuss any questions you have with your health care provider.

## 2014-02-26 ENCOUNTER — Other Ambulatory Visit: Payer: Self-pay | Admitting: Internal Medicine

## 2014-02-27 ENCOUNTER — Telehealth: Payer: Self-pay | Admitting: *Deleted

## 2014-02-27 ENCOUNTER — Other Ambulatory Visit: Payer: Self-pay | Admitting: *Deleted

## 2014-02-27 NOTE — Telephone Encounter (Signed)
Pharmacy requested refill of tivicay. Denied, as patient has multiple no-shows, has not been seen in 1 year, was given the same warning 12/2013.  Spoke with Sam, pharmacist at [facility].  Per Sam, patient does not like taking the Truvada, would prefer to take only the Tivicay.  Sam states he has counseled her, delivers the medication correctly each month, but has no idea if she is actually taking it.  Per Sam, it takes the pharmacy at least 5 days to get in touch with patient each month to set up/confirm delivery. Sam will advise patient her tivicay has been denied, that she needs to walk in for an appointment M-Th, 9-11 and 2-4.   Attempted to call patient, her voicemail is full and there was no answer.  Will call again tomorrow to see if she will come in for labs at least, per Dr. Luciana Axeomer. Andree CossHowell, Michelle M, RN

## 2014-03-02 ENCOUNTER — Other Ambulatory Visit: Payer: Self-pay | Admitting: *Deleted

## 2014-03-02 ENCOUNTER — Other Ambulatory Visit: Payer: Self-pay | Admitting: Internal Medicine

## 2014-03-02 NOTE — Telephone Encounter (Signed)
Pt has not kept appt to see the MD.  Last appt was 03/05/13.  Spoke with [facility].  Shared the same  Phone number that [facility] has on file.  Pharmacy thinks that the [place] may have [place]  Let pharmacy know that the pt needs to make an appointment for additional refills. Attempted phone call to the pt.  Pt's voice mailbox was full.  Only able to leave [facility] phone number to return call.

## 2014-04-17 ENCOUNTER — Inpatient Hospital Stay (HOSPITAL_COMMUNITY)
Admission: AD | Admit: 2014-04-17 | Discharge: 2014-04-17 | Disposition: A | Discharge status: Home / Self Care | Payer: Medicaid Other | Source: Ambulatory Visit | Attending: Obstetrics & Gynecology | Admitting: Obstetrics & Gynecology

## 2014-04-17 DIAGNOSIS — Z3201 Encounter for pregnancy test, result positive: Secondary | ICD-10-CM

## 2014-04-17 DIAGNOSIS — Z3200 Encounter for pregnancy test, result unknown: Secondary | ICD-10-CM | POA: Diagnosis present

## 2014-04-17 DIAGNOSIS — Z3202 Encounter for pregnancy test, result negative: Secondary | ICD-10-CM | POA: Insufficient documentation

## 2014-04-17 LAB — URINALYSIS, ROUTINE W REFLEX MICROSCOPIC
Bilirubin Urine: NEGATIVE
GLUCOSE, UA: NEGATIVE mg/dL
Hgb urine dipstick: NEGATIVE
KETONES UR: NEGATIVE mg/dL
Leukocytes, UA: NEGATIVE
NITRITE: NEGATIVE
Protein, ur: NEGATIVE mg/dL
Specific Gravity, Urine: 1.025 (ref 1.005–1.030)
Urobilinogen, UA: 0.2 mg/dL (ref 0.0–1.0)
pH: 6 (ref 5.0–8.0)

## 2014-04-17 LAB — POCT PREGNANCY, URINE: Preg Test, Ur: NEGATIVE

## 2014-04-17 LAB — HCG, QUANTITATIVE, PREGNANCY: hCG, Beta Chain, Quant, S: <1 m[IU]/mL (ref <5)

## 2014-04-17 NOTE — MAU Provider Note (Signed)
Chief Complaint: Possible Pregnancy   First Provider Initiated Contact with Patient 04/20/2014 at 1154.   SUBJECTIVE HPI: [name] is a 29 y.o. [phone] female who presents to Maternity Admission requesting pregnancy test. States she had one positive and one negative home UPT. LMP 04/12/14. Last unprotected IC 04/13/14. Denies fever, chills, abd pain, vaginal bleeding or vaginal discharge.   Past Medical History  Diagnosis Date  . HIV positive   . Asthma   . Depression     Postpartum after 1st pregnancy  . Hemorrhoids    OB History  Gravida Para Term Preterm AB SAB TAB Ectopic Multiple Living  5 4 3 1 1 1  0 0 0 3    # Outcome Date GA Lbr Len/2nd Weight Sex Delivery Anes PTL Lv  5 Term 11/29/11 [id number] 07:10 / 00:05 3.455 kg (7 lb 9.9 oz) F Vag-Spont EPI  Y  4 Term 12/27/09    M    N  3 Preterm 06/28/07 [id number]   F Vag-Spont   Y  2 Term 03/13/05 [id number]         1 SAB  [id number]       N     Comments: D&C      Past Surgical History  Procedure Laterality Date  . Laparoscopy  01/06/2011    Procedure: LAPAROSCOPY OPERATIVE;  Surgeon: Roseanna RainbowLisa A Jackson-Moore, MD;  Location: [facility];  Service: Gynecology;  Laterality: N/A;  . Laparotomy  01/06/2011    Procedure: LAPAROTOMY;  Surgeon: Roseanna RainbowLisa A Jackson-Moore, MD;  Location: [facility];  Service: Gynecology;  Laterality: N/A;  . Dilation and curettage of uterus    . Salpingoophorectomy     History   Social History  . Marital Status: Single    Spouse Name: N/A    Number of Children: N/A  . Years of Education: N/A   Occupational History  . Not on file.   Social History Main Topics  . Smoking status: Former Smoker -- 0.50 packs/day for 7 years    Types: Cigarettes    Quit date: 03/29/2011  . Smokeless tobacco: Never Used  . Alcohol Use: No  . Drug Use: No  . Sexual Activity: Yes    Birth Control/ Protection: Condom     Comment: condoms refused   Other Topics Concern  . Not on file   Social History Narrative   No current  facility-administered medications on file prior to encounter.   Current Outpatient Prescriptions on File Prior to Encounter  Medication Sig Dispense Refill  . cyclobenzaprine (FLEXERIL) 10 MG tablet Take 1 tablet (10 mg total) by mouth 2 (two) times daily as needed for muscle spasms. 20 tablet 0  . docusate sodium (COLACE) 100 MG capsule Take 1 capsule (100 mg total) by mouth every 12 (twelve) hours. 60 capsule 0  . emtricitabine-tenofovir (TRUVADA) 200-300 MG per tablet Take 1 tablet by mouth daily. 30 tablet 6  . HYDROcodone-acetaminophen (NORCO/VICODIN) 5-325 MG per tablet Take 1-2 tablets by mouth every 4 (four) hours as needed. 12 tablet 0  . hydrocortisone cream 1 % Apply to affected area 2 times daily 15 g 0  . nitrofurantoin, macrocrystal-monohydrate, (MACROBID) 100 MG capsule Take 1 capsule (100 mg total) by mouth 2 (two) times daily. 10 capsule 0  . ondansetron (ZOFRAN) 4 MG tablet Take 1 tablet (4 mg total) by mouth every 8 (eight) hours as needed for nausea or vomiting. 30 tablet 1  . TIVICAY 50 MG tablet TAKE ONE TABLET  BY MOUTH   DAILY 30 tablet 0   No Known Allergies  ROS: Pertinent items in HPI  OBJECTIVE Blood pressure 133/85, pulse 100, resp. rate 16, height  (1.6 m), weight 79.379 kg (175 lb), last menstrual period 04/09/2014. GENERAL: Well-developed, well-nourished female in no acute distress.  HEENT: Normocephalic HEART: normal rate RESP: normal effort NEURO: Alert and oriented SPECULUM EXAM: Declined  LAB RESULTS Results for orders placed or performed during the hospital encounter of 04/17/14 (from the past 24 hour(s))  Urinalysis, Routine w reflex microscopic     Status: None   Collection Time: 04/17/14 10:36 AM  Result Value Ref Range   Color, Urine YELLOW YELLOW   APPearance CLEAR CLEAR   Specific Gravity, Urine 1.025 1.005 - 1.030   pH 6.0 5.0 - 8.0   Glucose, UA NEGATIVE NEGATIVE mg/dL   Hgb urine dipstick NEGATIVE NEGATIVE   Bilirubin Urine  NEGATIVE NEGATIVE   Ketones, ur NEGATIVE NEGATIVE mg/dL   Protein, ur NEGATIVE NEGATIVE mg/dL   Urobilinogen, UA 0.2 0.0 - 1.0 mg/dL   Nitrite NEGATIVE NEGATIVE   Leukocytes, UA NEGATIVE NEGATIVE  Pregnancy, urine POC     Status: None   Collection Time: 04/17/14 10:51 AM  Result Value Ref Range   Preg Test, Ur NEGATIVE NEGATIVE  hCG, quantitative, pregnancy     Status: None   Collection Time: 04/17/14 11:10 AM  Result Value Ref Range   hCG, Beta Chain, Quant, S <1 <5 mIU/mL    IMAGING No results found.  MAU COURSE  ASSESSMENT Negative pregnancy test Unprotected intercourse  PLAN Discharge home in stable condition. Recommend condoms if pregnancy not desired and to prevent STDs for pt and partner. List of gynecologists given.     Follow-up Information    Follow up with Gynecologist.   Why:  For routine gynecology care      Follow up with [facility] [place] [facility].   Why:  As needed in emergencies   Contact information:   [address] [id number] mc [place] [address] [phone]       Medication List    ASK your doctor about these medications        cyclobenzaprine 10 MG tablet  Commonly known as:  FLEXERIL  Take 1 tablet (10 mg total) by mouth 2 (two) times daily as needed for muscle spasms.     docusate sodium 100 MG capsule  Commonly known as:  COLACE  Take 1 capsule (100 mg total) by mouth every 12 (twelve) hours.     emtricitabine-tenofovir 200-300 MG per tablet  Commonly known as:  TRUVADA  Take 1 tablet by mouth daily.     HYDROcodone-acetaminophen 5-325 MG per tablet  Commonly known as:  NORCO/VICODIN  Take 1-2 tablets by mouth every 4 (four) hours as needed.     hydrocortisone cream 1 %  Apply to affected area 2 times daily     nitrofurantoin (macrocrystal-monohydrate) 100 MG capsule  Commonly known as:  MACROBID  Take 1 capsule (100 mg total) by mouth 2 (two) times daily.      ondansetron 4 MG tablet  Commonly known as:  ZOFRAN  Take 1 tablet (4 mg total) by mouth every 8 (eight) hours as needed for nausea or vomiting.     TIVICAY 50 MG tablet  Generic drug:  dolutegravir  TAKE ONE TABLET   BY MOUTH   DAILY       [place], [place] 04/17/2014  12:13 PM

## 2014-04-17 NOTE — Discharge Instructions (Signed)
Contraception Choices Contraception (birth control) is the use of any methods or devices to prevent pregnancy. Below are some methods to help avoid pregnancy. HORMONAL METHODS   Contraceptive implant. This is a thin, plastic tube containing progesterone hormone. It does not contain estrogen hormone. Your health care provider inserts the tube in the inner part of the upper arm. The tube can remain in place for up to 3 years. After 3 years, the implant must be removed. The implant prevents the ovaries from releasing an egg (ovulation), thickens the cervical mucus to prevent sperm from entering the uterus, and thins the lining of the inside of the uterus.  Progesterone-only injections. These injections are given every 3 months by your health care provider to prevent pregnancy. This synthetic progesterone hormone stops the ovaries from releasing eggs. It also thickens cervical mucus and changes the uterine lining. This makes it harder for sperm to survive in the uterus.  Birth control pills. These pills contain estrogen and progesterone hormone. They work by preventing the ovaries from releasing eggs (ovulation). They also cause the cervical mucus to thicken, preventing the sperm from entering the uterus. Birth control pills are prescribed by a health care provider.Birth control pills can also be used to treat heavy periods.  Minipill. This type of birth control pill contains only the progesterone hormone. They are taken every day of each month and must be prescribed by your health care provider.  Birth control patch. The patch contains hormones similar to those in birth control pills. It must be changed once a week and is prescribed by a health care provider.  Vaginal ring. The ring contains hormones similar to those in birth control pills. It is left in the vagina for 3 weeks, removed for 1 week, and then a new one is put back in place. The patient must be comfortable inserting and removing the ring  from the vagina.A health care provider's prescription is necessary.  Emergency contraception. Emergency contraceptives prevent pregnancy after unprotected sexual intercourse. This pill can be taken right after sex or up to 5 days after unprotected sex. It is most effective the sooner you take the pills after having sexual intercourse. Most emergency contraceptive pills are available without a prescription. Check with your pharmacist. Do not use emergency contraception as your only form of birth control. BARRIER METHODS   Female condom. This is a thin sheath (latex or rubber) that is worn over the penis during sexual intercourse. It can be used with spermicide to increase effectiveness.  Female condom. This is a soft, loose-fitting sheath that is put into the vagina before sexual intercourse.  Diaphragm. This is a soft, latex, dome-shaped barrier that must be fitted by a health care provider. It is inserted into the vagina, along with a spermicidal jelly. It is inserted before intercourse. The diaphragm should be left in the vagina for 6 to 8 hours after intercourse.  Cervical cap. This is a round, soft, latex or plastic cup that fits over the cervix and must be fitted by a health care provider. The cap can be left in place for up to 48 hours after intercourse.  Sponge. This is a soft, circular piece of polyurethane foam. The sponge has spermicide in it. It is inserted into the vagina after wetting it and before sexual intercourse.  Spermicides. These are chemicals that kill or block sperm from entering the cervix and uterus. They come in the form of creams, jellies, suppositories, foam, or tablets. They do not require a   prescription. They are inserted into the vagina with an applicator before having sexual intercourse. The process must be repeated every time you have sexual intercourse. INTRAUTERINE CONTRACEPTION  Intrauterine device (IUD). This is a T-shaped device that is put in a woman's uterus  during a menstrual period to prevent pregnancy. There are 2 types:  Copper IUD. This type of IUD is wrapped in copper wire and is placed inside the uterus. Copper makes the uterus and fallopian tubes produce a fluid that kills sperm. It can stay in place for 10 years.  Hormone IUD. This type of IUD contains the hormone progestin (synthetic progesterone). The hormone thickens the cervical mucus and prevents sperm from entering the uterus, and it also thins the uterine lining to prevent implantation of a fertilized egg. The hormone can weaken or kill the sperm that get into the uterus. It can stay in place for 3-5 years, depending on which type of IUD is used. PERMANENT METHODS OF CONTRACEPTION  Female tubal ligation. This is when the woman's fallopian tubes are surgically sealed, tied, or blocked to prevent the egg from traveling to the uterus.  Hysteroscopic sterilization. This involves placing a small coil or insert into each fallopian tube. Your doctor uses a technique called hysteroscopy to do the procedure. The device causes scar tissue to form. This results in permanent blockage of the fallopian tubes, so the sperm cannot fertilize the egg. It takes about 3 months after the procedure for the tubes to become blocked. You must use another form of birth control for these 3 months.  Female sterilization. This is when the female has the tubes that carry sperm tied off (vasectomy).This blocks sperm from entering the vagina during sexual intercourse. After the procedure, the man can still ejaculate fluid (semen). NATURAL PLANNING METHODS  Natural family planning. This is not having sexual intercourse or using a barrier method (condom, diaphragm, cervical cap) on days the woman could become pregnant.  Calendar method. This is keeping track of the length of each menstrual cycle and identifying when you are fertile.  Ovulation method. This is avoiding sexual intercourse during ovulation.  Symptothermal  method. This is avoiding sexual intercourse during ovulation, using a thermometer and ovulation symptoms.  Post-ovulation method. This is timing sexual intercourse after you have ovulated. Regardless of which type or method of contraception you choose, it is important that you use condoms to protect against the transmission of sexually transmitted infections (STIs). Talk with your health care provider about which form of contraception is most appropriate for you. Document Released: 03/09/2005 Document Revised: 03/14/2013 Document Reviewed: 09/01/2012 ExitCare Patient Information 2015 [employer]. This information is not intended to replace advice given to you by your health care provider. Make sure you discuss any questions you have with your health care provider.  

## 2014-04-17 NOTE — MAU Note (Signed)
Pt presents to MAU for pregnancy test. Denies any vaginal bleeding or discharge.

## 2014-04-17 NOTE — MAU Note (Signed)
Pt reports one positive and one negative home pregnancy test. Unprotected sex on January the 22.

## 2014-04-18 ENCOUNTER — Emergency Department (HOSPITAL_COMMUNITY)
Admission: EM | Admit: 2014-04-18 | Discharge: 2014-04-18 | Discharge status: Against Medical Advice | Payer: Medicaid Other | Attending: Emergency Medicine | Admitting: Emergency Medicine

## 2014-04-18 ENCOUNTER — Encounter (HOSPITAL_COMMUNITY): Payer: Self-pay

## 2014-04-18 DIAGNOSIS — Z21 Asymptomatic human immunodeficiency virus [HIV] infection status: Secondary | ICD-10-CM | POA: Diagnosis not present

## 2014-04-18 DIAGNOSIS — R1110 Vomiting, unspecified: Secondary | ICD-10-CM | POA: Insufficient documentation

## 2014-04-18 DIAGNOSIS — Z7952 Long term (current) use of systemic steroids: Secondary | ICD-10-CM | POA: Insufficient documentation

## 2014-04-18 DIAGNOSIS — Z79899 Other long term (current) drug therapy: Secondary | ICD-10-CM | POA: Insufficient documentation

## 2014-04-18 DIAGNOSIS — J45909 Unspecified asthma, uncomplicated: Secondary | ICD-10-CM | POA: Diagnosis not present

## 2014-04-18 DIAGNOSIS — R1030 Lower abdominal pain, unspecified: Secondary | ICD-10-CM

## 2014-04-18 DIAGNOSIS — R109 Unspecified abdominal pain: Secondary | ICD-10-CM | POA: Diagnosis present

## 2014-04-18 DIAGNOSIS — Z8719 Personal history of other diseases of the digestive system: Secondary | ICD-10-CM | POA: Diagnosis not present

## 2014-04-18 DIAGNOSIS — R102 Pelvic and perineal pain: Secondary | ICD-10-CM | POA: Diagnosis not present

## 2014-04-18 DIAGNOSIS — Z3202 Encounter for pregnancy test, result negative: Secondary | ICD-10-CM | POA: Insufficient documentation

## 2014-04-18 DIAGNOSIS — Z87891 Personal history of nicotine dependence: Secondary | ICD-10-CM | POA: Insufficient documentation

## 2014-04-18 LAB — COMPREHENSIVE METABOLIC PANEL
ALBUMIN: 3.7 g/dL (ref 3.5–5.2)
ALT: 12 U/L (ref 0–35)
AST: 14 U/L (ref 0–37)
Alkaline Phosphatase: 34 U/L — ABNORMAL LOW (ref 39–117)
Anion gap: 6 (ref 5–15)
BUN: 7 mg/dL (ref 6–23)
CHLORIDE: 105 mmol/L (ref 96–112)
CO2: 25 mmol/L (ref 19–32)
Calcium: 9.3 mg/dL (ref 8.4–10.5)
Creatinine, Ser: 0.68 mg/dL (ref 0.50–1.10)
GFR calc Af Amer: >90 mL/min (ref >90)
GFR calc non Af Amer: >90 mL/min (ref >90)
Glucose, Bld: 114 mg/dL — ABNORMAL HIGH (ref 70–99)
Potassium: 3.7 mmol/L (ref 3.5–5.1)
Sodium: 136 mmol/L (ref 135–145)
TOTAL PROTEIN: 7.6 g/dL (ref 6.0–8.3)
Total Bilirubin: 0.4 mg/dL (ref 0.3–1.2)

## 2014-04-18 LAB — CBC WITH DIFFERENTIAL/PLATELET
Basophils Absolute: 0 10*3/uL (ref 0.0–0.1)
Basophils Relative: 0 % (ref 0–1)
EOS ABS: 0.1 10*3/uL (ref 0.0–0.7)
EOS PCT: 2 % (ref 0–5)
HEMATOCRIT: 40.5 % (ref 36.0–46.0)
HEMOGLOBIN: 13.3 g/dL (ref 12.0–15.0)
LYMPHS ABS: 1.9 10*3/uL (ref 0.7–4.0)
Lymphocytes Relative: 24 % (ref 12–46)
MCH: 30.2 pg (ref 26.0–34.0)
MCHC: 32.8 g/dL (ref 30.0–36.0)
MCV: 91.8 fL (ref 78.0–100.0)
Monocytes Absolute: 0.3 10*3/uL (ref 0.1–1.0)
Monocytes Relative: 4 % (ref 3–12)
NEUTROS ABS: 5.3 10*3/uL (ref 1.7–7.7)
Neutrophils Relative %: 70 % (ref 43–77)
Platelets: 323 10*3/uL (ref 150–400)
RBC: 4.41 MIL/uL (ref 3.87–5.11)
RDW: 13.4 % (ref 11.5–15.5)
WBC: 7.6 10*3/uL (ref 4.0–10.5)

## 2014-04-18 LAB — LIPASE, BLOOD: Lipase: 27 U/L (ref 11–59)

## 2014-04-18 LAB — URINALYSIS, ROUTINE W REFLEX MICROSCOPIC
Bilirubin Urine: NEGATIVE
Glucose, UA: NEGATIVE mg/dL
Hgb urine dipstick: NEGATIVE
KETONES UR: NEGATIVE mg/dL
Nitrite: NEGATIVE
PROTEIN: NEGATIVE mg/dL
Specific Gravity, Urine: 1.006 (ref 1.005–1.030)
Urobilinogen, UA: 0.2 mg/dL (ref 0.0–1.0)
pH: 6 (ref 5.0–8.0)

## 2014-04-18 LAB — WET PREP, GENITAL
Trich, Wet Prep: NONE SEEN
YEAST WET PREP: NONE SEEN

## 2014-04-18 LAB — URINE MICROSCOPIC-ADD ON

## 2014-04-18 LAB — POC URINE PREG, ED: PREG TEST UR: NEGATIVE

## 2014-04-18 MED ORDER — SODIUM CHLORIDE 0.9 % IV BOLUS (SEPSIS)
1000.0000 mL | Freq: Once | INTRAVENOUS | Status: AC
  Administered 2014-04-18: 1000 mL via INTRAVENOUS

## 2014-04-18 MED ORDER — ACETAMINOPHEN 325 MG PO TABS
650.0000 mg | ORAL_TABLET | Freq: Once | ORAL | Status: AC
  Administered 2014-04-18: 650 mg via ORAL
  Filled 2014-04-18: qty 2

## 2014-04-18 MED ORDER — AZITHROMYCIN 250 MG PO TABS
1000.0000 mg | ORAL_TABLET | Freq: Once | ORAL | Status: DC
  Filled 2014-04-18: qty 4

## 2014-04-18 MED ORDER — CEFTRIAXONE SODIUM 250 MG IJ SOLR
250.0000 mg | Freq: Once | INTRAMUSCULAR | Status: DC
  Filled 2014-04-18: qty 250

## 2014-04-18 MED ORDER — LIDOCAINE HCL (PF) 1 % IJ SOLN
2.0000 mL | Freq: Once | INTRAMUSCULAR | Status: DC
Start: 2014-04-18 — End: 2014-04-18
  Filled 2014-04-18: qty 5

## 2014-04-18 NOTE — ED Provider Notes (Signed)
CSN: [id number]     Arrival date & time 04/18/14  1632 History   First MD Initiated Contact with Patient 04/18/14 1649     Chief Complaint  Patient presents with  . Abdominal Pain  . Emesis     (Consider location/radiation/quality/duration/timing/severity/associated sxs/prior Treatment) HPI Comments: [name] is a 29 y.o. female with a PMHx of HIV, hemorrhoids, asthma, and depression with a PSHx of salpingooophorectomy, who presents to the ED with complaints of suprapubic sharp abdominal pain that began 3 days ago. Initially she had nausea vomiting and diarrhea, which has since subsided, but her abdominal pain lingers. She reports the pain is 9/10 intermittent suprapubic nonradiating pain which worsens with eating and with no known alleviating factors, and she has not tried anything for pain. She was seen at the [facility] yesterday for pregnancy test which was negative, she was concerned because she had unprotected intercourse on 04/13/14. She denies any fevers, chills, chest pain, shortness of breath, nausea, vomiting, diarrhea, constipation, obstipation, melena, hematochezia, dysuria, hematuria, vaginal bleeding or discharge, numbness, tingling, weakness, alcohol use, sick contacts, suspicious food intake, travel, or antibiotic use. Her last menstrual period ended on 04/12/14. She is sexually active with 1 partner, unprotected.   Patient is a 29 y.o. female presenting with abdominal pain and vomiting. The history is provided by the patient. No language interpreter was used.  Abdominal Pain Pain location:  Suprapubic Pain quality: sharp   Pain radiates to:  Does not radiate Pain severity:  Severe (9/10) Onset quality:  Gradual Duration:  3 days Timing:  Intermittent Progression:  Unchanged Chronicity:  New Context: recent sexual activity (unprotected sex 04/13/14)   Context: not recent travel, not sick contacts and not suspicious food intake   Relieved by:  None tried Worsened by:   Eating Ineffective treatments:  None tried Associated symptoms: no belching, no chest pain, no chills, no constipation, no diarrhea, no dysuria, no fever, no flatus, no hematemesis, no hematochezia, no hematuria, no melena, no nausea, no shortness of breath, no vaginal bleeding, no vaginal discharge and no vomiting   Emesis Associated symptoms: abdominal pain   Associated symptoms: no arthralgias, no chills, no diarrhea and no myalgias     Past Medical History  Diagnosis Date  . HIV positive   . Asthma   . Depression     Postpartum after 1st pregnancy  . Hemorrhoids    Past Surgical History  Procedure Laterality Date  . Laparoscopy  01/06/2011    Procedure: LAPAROSCOPY OPERATIVE;  Surgeon: Roseanna Rainbow, MD;  Location: [facility];  Service: Gynecology;  Laterality: N/A;  . Laparotomy  01/06/2011    Procedure: LAPAROTOMY;  Surgeon: Roseanna Rainbow, MD;  Location: [facility];  Service: Gynecology;  Laterality: N/A;  . Dilation and curettage of uterus    . Salpingoophorectomy     Family History  Problem Relation Age of Onset  . Diabetes Mother   . Hypertension Mother   . Hyperlipidemia Mother   . Kidney disease Mother   . Diabetes Brother   . Diabetes Maternal Aunt   . Hypertension Maternal Aunt   . Asthma Daughter    History  Substance Use Topics  . Smoking status: Former Smoker -- 0.50 packs/day for 7 years    Types: Cigarettes    Quit date: 03/29/2011  . Smokeless tobacco: Never Used  . Alcohol Use: No   OB History    Gravida Para Term Preterm AB TAB SAB Ectopic Multiple Living   5  0 1 0 0 3     Review of Systems  Constitutional: Negative for fever and chills.  Respiratory: Negative for shortness of breath.   Cardiovascular: Negative for chest pain.  Gastrointestinal: Positive for abdominal pain. Negative for nausea, vomiting, diarrhea, constipation, blood in stool, melena, hematochezia, abdominal distention, flatus and hematemesis.  Genitourinary:  Negative for dysuria, urgency, frequency, hematuria, flank pain, vaginal bleeding and vaginal discharge.  Musculoskeletal: Negative for myalgias, back pain and arthralgias.  Skin: Negative for rash.  Allergic/Immunologic: Positive for immunocompromised state (HIV+).  Neurological: Negative for weakness, light-headedness and numbness.  Hematological: Negative for adenopathy.   10 Systems reviewed and are negative for acute change except as noted in the HPI.    Allergies  Review of patient's allergies indicates no known allergies.  Home Medications   Prior to Admission medications   Medication Sig Start Date End Date Taking? Authorizing Provider  cyclobenzaprine (FLEXERIL) 10 MG tablet Take 1 tablet (10 mg total) by mouth 2 (two) times daily as needed for muscle spasms. 01/23/14   Shari A Upstill, PA-C  docusate sodium (COLACE) 100 MG capsule Take 1 capsule (100 mg total) by mouth every 12 (twelve) hours. 07/02/13   Robyn M Hess, PA-C  emtricitabine-tenofovir (TRUVADA) 200-300 MG per tablet Take 1 tablet by mouth daily. 10/03/13   Gardiner Barefoot, MD  HYDROcodone-acetaminophen (NORCO/VICODIN) 5-325 MG per tablet Take 1-2 tablets by mouth every 4 (four) hours as needed. 01/23/14   Shari A Upstill, PA-C  hydrocortisone cream 1 % Apply to affected area 2 times daily 07/02/13   Robyn M Hess, PA-C  ondansetron (ZOFRAN) 4 MG tablet Take 1 tablet (4 mg total) by mouth every 8 (eight) hours as needed for nausea or vomiting. 07/28/13   Gardiner Barefoot, MD  TIVICAY 50 MG tablet TAKE ONE TABLET   BY MOUTH   DAILY 01/16/14   Gardiner Barefoot, MD   BP 124/103 mmHg  Pulse 79  Temp(Src) 98.3 F (36.8 C)  Resp 18  SpO2 100%  LMP 04/09/2014 Physical Exam  Constitutional: She is oriented to person, place, and time. Vital signs are normal. She appears well-developed and well-nourished.  Non-toxic appearance. No distress.  Afebrile, nontoxic, NAD  HENT:  Head: Normocephalic and atraumatic.  Mouth/Throat:  Oropharynx is clear and moist and mucous membranes are normal.  Eyes: Conjunctivae and EOM are normal. Right eye exhibits no discharge. Left eye exhibits no discharge.  Neck: Normal range of motion. Neck supple.  Cardiovascular: Normal rate, regular rhythm, normal heart sounds and intact distal pulses.  Exam reveals no gallop and no friction rub.   No murmur heard. Pulmonary/Chest: Effort normal and breath sounds normal. No respiratory distress. She has no decreased breath sounds. She has no wheezes. She has no rhonchi. She has no rales.  Abdominal: Soft. Normal appearance and bowel sounds are normal. She exhibits no distension. There is tenderness in the suprapubic area. There is no rigidity, no rebound, no guarding, no CVA tenderness, no tenderness at McBurney's point and negative Merryfield's sign.    Soft, ND, +BS throughout, with suprapubic TTP, no r/g/r, neg Smelser's, neg mcburney's, no CVA TTP   Genitourinary: Vagina normal. Pelvic exam was performed with patient supine. There is no rash, tenderness or lesion on the right labia. There is no rash, tenderness or lesion on the left labia. Uterus is tender. Cervix exhibits motion tenderness, discharge and friability. Right adnexum displays no mass, no tenderness and no fullness. Left  adnexum displays no mass, no tenderness and no fullness.  Chaperone present for exam. No rashes, lesions, or tenderness to external genitalia. No erythema, injury, or tenderness to vaginal mucosa. No vaginal discharge or bleeding within vaginal vault. No adnexal masses, tenderness, or fullness. +CMT, +cervical friability, +mucoid discharge from cervical os. Uterus non-deviated, mobile, and without enlargement, but mildly tender with palpation.    Musculoskeletal: Normal range of motion.  Neurological: She is alert and oriented to person, place, and time. She has normal strength. No sensory deficit.  Skin: Skin is warm, dry and intact. No rash noted.  Psychiatric: She has  a normal mood and affect.  Nursing note and vitals reviewed.   ED Course  Procedures (including critical care time) Labs Review Labs Reviewed  WET PREP, GENITAL - Abnormal; Notable for the following:    Clue Cells Wet Prep HPF POC FEW (*)    WBC, Wet Prep HPF POC TOO NUMEROUS TO COUNT (*)    All other components within normal limits  COMPREHENSIVE METABOLIC PANEL - Abnormal; Notable for the following:    Glucose, Bld 114 (*)    Alkaline Phosphatase 34 (*)    All other components within normal limits  URINALYSIS, ROUTINE W REFLEX MICROSCOPIC - Abnormal; Notable for the following:    Leukocytes, UA TRACE (*)    All other components within normal limits  URINE MICROSCOPIC-ADD ON - Abnormal; Notable for the following:    Squamous Epithelial / LPF MANY (*)    All other components within normal limits  CBC WITH DIFFERENTIAL/PLATELET  LIPASE, BLOOD  RPR  POC URINE PREG, ED  GC/CHLAMYDIA PROBE AMP ([place])    Imaging Review No results found.   EKG Interpretation None      MDM   Final diagnoses:  Pelvic pain in female  Lower abdominal pain    29 y.o. female with lower abd pain. Seen by [facility] yesterday for pregnancy test which was neg. Did not have pelvic performed. On exam suprapubic tenderness noted. Will obtain labs and pelvic, and possibly obtain pelvic ultrasound if exam findings are concerning. Will give tylenol now since pt is driving and on antiretrovirals therefore will avoid toradol until Cr known. Will reassess shortly.  6:32 PM Upreg neg. U/A with trace leuks but many squamous, 0-2 WBC, rare bacteria, doubt UTI. CBC w/diff WNL. CMP WNL. Lipase WNL. Pelvic with +CMT. Will empirically tx for GC/CT, obtain ultrasound, and likely tx for PID with doxy x2 wks  7:21 PM Wet prep with TNTC WBC concerning for GC/CT. Pelvic u/s still pending. Pt refusing Rocephin and Azithro, explained that this is the treatment for GC/CT/PID, pt still refused. Dr. Romeo Apple will also  attempt to discuss with the pt why we're doing this treatment plan  7:43 PM Dr. Romeo Apple talked to pt, she cannot provide a reason as to why she doesn't want the treatment, states she just wants to go home. Will have her sign AMA paperwork, cancelled U/S and rocephin/azithro. [facility]. Pt is discharged AMA, refusing any treatments.   BP 111/68 mmHg  Pulse 96  Temp(Src) 98.3 F (36.8 C)  Resp 16  SpO2 100%  LMP 04/09/2014  Meds ordered this encounter  Medications  . acetaminophen (TYLENOL) tablet 650 mg    Sig:   . sodium chloride 0.9 % bolus 1,000 mL    Sig:   . DISCONTD: azithromycin (ZITHROMAX) tablet 1,000 mg    Sig:   .  DISCONTD: cefTRIAXone (ROCEPHIN) injection 250 mg    Sig:     Order Specific Question:  Antibiotic Indication:    Answer:  STD  . DISCONTD: lidocaine (PF) (XYLOCAINE) 1 % injection 2 mL    Sig:   . sodium chloride 0.9 % bolus 1,000 mL    Sig:      Donnita FallsMercedes Strupp Mill Villageamprubi-Soms, PA-C 04/18/14 1945  Purvis SheffieldForrest Harrison, MD 04/19/14 1152

## 2014-04-18 NOTE — ED Notes (Signed)
Pt reports vomiting and abdominal pain x 2 days.  Sts she has thrown up about 3 times in the last day.  Pain localized to lower abdomen. Denies any discharge.

## 2014-04-18 NOTE — Discharge Instructions (Signed)
Abdominal (belly) pain can be caused by many things. Your caregiver performed an examination and possibly ordered blood/urine tests and imaging (CT scan, x-rays, ultrasound). Many cases can be observed and treated at home after initial evaluation in the emergency department. Even though you are being discharged home, abdominal pain can be unpredictable. Therefore, you need a repeated exam if your pain does not resolve, returns, or worsens. Most patients with abdominal pain don't have to be admitted to the hospital or have surgery, but serious problems like appendicitis and gallbladder attacks can start out as nonspecific pain. Many abdominal conditions cannot be diagnosed in one visit, so follow-up evaluations are very important. SEEK IMMEDIATE MEDICAL ATTENTION IF YOU DEVELOP ANY OF THE FOLLOWING SYMPTOMS:  The pain does not go away or becomes severe.   A temperature above 101 develops.   Repeated vomiting occurs (multiple episodes).   The pain becomes localized to portions of the abdomen. The right side could possibly be appendicitis. In an adult, the left lower portion of the abdomen could be colitis or diverticulitis.   Blood is being passed in stools or vomit (bright red or black tarry stools).   Return also if you develop chest pain, difficulty breathing, dizziness or fainting, or become confused, poorly responsive, or inconsolable (young children).  The constipation stays for more than 4 days.   There is belly (abdominal) or rectal pain.   You do not seem to be getting better.

## 2014-04-18 NOTE — ED Notes (Signed)
Pt refused zithromax and rocephin for treatment of likely STD against medical advice.

## 2014-04-19 LAB — GC/CHLAMYDIA PROBE AMP (~~LOC~~) NOT AT ARMC
Chlamydia: NEGATIVE
Neisseria Gonorrhea: NEGATIVE

## 2014-04-20 LAB — RPR: RPR Ser Ql: NONREACTIVE

## 2014-04-23 ENCOUNTER — Inpatient Hospital Stay (HOSPITAL_COMMUNITY)
Admission: AD | Admit: 2014-04-23 | Discharge: 2014-04-23 | Discharge status: Against Medical Advice | Payer: Medicaid Other | Source: Ambulatory Visit | Attending: Obstetrics and Gynecology | Admitting: Obstetrics and Gynecology

## 2014-04-23 DIAGNOSIS — Z3200 Encounter for pregnancy test, result unknown: Secondary | ICD-10-CM | POA: Diagnosis present

## 2014-04-23 DIAGNOSIS — Z87891 Personal history of nicotine dependence: Secondary | ICD-10-CM | POA: Insufficient documentation

## 2014-04-23 DIAGNOSIS — Z3202 Encounter for pregnancy test, result negative: Secondary | ICD-10-CM | POA: Insufficient documentation

## 2014-04-23 LAB — URINALYSIS, ROUTINE W REFLEX MICROSCOPIC
Bilirubin Urine: NEGATIVE
Glucose, UA: NEGATIVE mg/dL
HGB URINE DIPSTICK: NEGATIVE
Ketones, ur: NEGATIVE mg/dL
Leukocytes, UA: NEGATIVE
Nitrite: NEGATIVE
PH: 6 (ref 5.0–8.0)
PROTEIN: NEGATIVE mg/dL
Specific Gravity, Urine: >1.03 — ABNORMAL HIGH (ref 1.005–1.030)
UROBILINOGEN UA: 0.2 mg/dL (ref 0.0–1.0)

## 2014-04-23 LAB — HCG, QUANTITATIVE, PREGNANCY

## 2014-04-23 LAB — POCT PREGNANCY, URINE: PREG TEST UR: NEGATIVE

## 2014-04-23 NOTE — [facility] Note (Signed)
Pt called from lobby and was not there.

## 2014-04-23 NOTE — MAU Note (Signed)
Pt called from lobby, was not there or responded.

## 2014-04-23 NOTE — [facility] Provider Note (Signed)
CC: Possible Pregnancy    None     HPI [name] is a 29 y.o. [id number] who states she had a positive HPT yesterday.She was seen here 04/20/2014 with concern of pregnancy with positive HPT and unprotected intercourse last reported 04/13/2014. LMP 04/01/14. Denies vaginal bleeding, abdominal pain, irritative vaginal discharge.  Past Medical History  Diagnosis Date  . HIV positive   . Asthma   . Depression     Postpartum after 1st pregnancy  . Hemorrhoids     OB History  Gravida Para Term Preterm AB SAB TAB Ectopic Multiple Living  5 4 3 1 1 1  0 0 0 3    # Outcome Date GA Lbr Len/2nd Weight Sex Delivery Anes PTL Lv  5 Term 11/29/11 5794w0d 07:10 / 00:05 3.455 kg (7 lb 9.9 oz) F Vag-Spont EPI  Y  4 Term 12/27/09    M    N  3 Preterm 06/28/07 2540w0d   F Vag-Spont   Y  2 Term 03/13/05 8394w0d         1 SAB  3289w0d       N     Comments: D&C       Past Surgical History  Procedure Laterality Date  . Laparoscopy  01/06/2011    Procedure: LAPAROSCOPY OPERATIVE;  Surgeon: Roseanna RainbowLisa A Jackson-Moore, MD;  Location: [facility];  Service: Gynecology;  Laterality: N/A;  . Laparotomy  01/06/2011    Procedure: LAPAROTOMY;  Surgeon: Roseanna RainbowLisa A Jackson-Moore, MD;  Location: [facility];  Service: Gynecology;  Laterality: N/A;  . Dilation and curettage of uterus    . Salpingoophorectomy      History   Social History  . Marital Status: Single    Spouse Name: N/A    Number of Children: N/A  . Years of Education: N/A   Occupational History  . Not on file.   Social History Main Topics  . Smoking status: Former Smoker -- 0.50 packs/day for 7 years    Types: Cigarettes    Quit date: 03/29/2011  . Smokeless tobacco: Never Used  . Alcohol Use: No  . Drug Use: No  . Sexual Activity: Yes    Birth Control/ Protection: Condom     Comment: condoms refused   Other Topics Concern  . Not on file   Social History Narrative    No current facility-administered medications on file prior to encounter.    Current Outpatient Prescriptions on File Prior to Encounter  Medication Sig Dispense Refill  . cyclobenzaprine (FLEXERIL) 10 MG tablet Take 1 tablet (10 mg total) by mouth 2 (two) times daily as needed for muscle spasms. 20 tablet 0  . docusate sodium (COLACE) 100 MG capsule Take 1 capsule (100 mg total) by mouth every 12 (twelve) hours. 60 capsule 0  . emtricitabine-tenofovir (TRUVADA) 200-300 MG per tablet Take 1 tablet by mouth daily. 30 tablet 6  . HYDROcodone-acetaminophen (NORCO/VICODIN) 5-325 MG per tablet Take 1-2 tablets by mouth every 4 (four) hours as needed. 12 tablet 0  . hydrocortisone cream 1 % Apply to affected area 2 times daily 15 g 0  . ondansetron (ZOFRAN) 4 MG tablet Take 1 tablet (4 mg total) by mouth every 8 (eight) hours as needed for nausea or vomiting. 30 tablet 1  . TIVICAY 50 MG tablet TAKE ONE TABLET   BY MOUTH   DAILY 30 tablet 0    No Known Allergies   ROS Pertinent items in HPI   PHYSICAL EXAM Filed Vitals:  04/23/14 1338  BP: 128/88  Pulse: 92  Temp: 98.6 F (37 C)  Resp: 16   General: Well nourished, well developed female in no acute distress Exam deferred  LAB RESULTS Results for orders placed or performed during the hospital encounter of 04/23/14 (from the past 24 hour(s))  Urinalysis, Routine w reflex microscopic     Status: Abnormal   Collection Time: 04/23/14  1:35 PM  Result Value Ref Range   Color, Urine YELLOW YELLOW   APPearance CLEAR CLEAR   Specific Gravity, Urine >1.030 (H) 1.005 - 1.030   pH 6.0 5.0 - 8.0   Glucose, UA NEGATIVE NEGATIVE mg/dL   Hgb urine dipstick NEGATIVE NEGATIVE   Bilirubin Urine NEGATIVE NEGATIVE   Ketones, ur NEGATIVE NEGATIVE mg/dL   Protein, ur NEGATIVE NEGATIVE mg/dL   Urobilinogen, UA 0.2 0.0 - 1.0 mg/dL   Nitrite NEGATIVE NEGATIVE   Leukocytes, UA NEGATIVE NEGATIVE  Pregnancy, urine POC     Status: None   Collection Time: 04/23/14  1:47 PM  Result Value Ref Range   Preg Test, Ur NEGATIVE  NEGATIVE  hCG, quantitative, pregnancy     Status: None   Collection Time: 04/23/14  2:03 PM  Result Value Ref Range   hCG, Beta Chain, Quant, S <1 <5 mIU/mL    IMAGING No results found.  MAU COURSE Did not wait for quantitative beta hCG result because she had to go to work. Called patient with result and advised of sources for contraceptive care and GYN care> stated she would call for appointment   ASSESSMENT  1. Pregnancy test negative     PLAN Discharged with reassurance she is not pregnant.  F/U [facility] for contraception.       Danae Orleans, CNM 04/23/2014 3:52 PM

## 2014-04-23 NOTE — MAU Note (Signed)
Pt presents for a pregnancy test. States she had a positive home test yesterday. Pt complains of pain in her lower back. Denies vaginal bleeding or discharge.

## 2014-04-23 NOTE — MAU Note (Signed)
Pt called for in lobby for third time. No answer or sight of patient.

## 2014-04-23 NOTE — Discharge Instructions (Signed)
Pregnancy Tests °HOW DO PREGNANCY TESTS WORK? °All pregnancy tests look for a special hormone in the urine or blood that is only present in pregnant women. This hormone, human chorionic gonadotropin (hCG), is also called the pregnancy hormone.  °WHAT IS THE DIFFERENCE BETWEEN A URINE AND A BLOOD PREGNANCY TEST? IS ONE BETTER THAN THE OTHER? °There are two types of pregnancy tests. °· Blood tests. °· Urine tests. °Both tests look for the presence of hCG, the pregnancy hormone. Many women use a urine test or home pregnancy test (HPT) to find out if they are pregnant. HPTs are cheap, easy to use, can be done at home, and are private. When a woman has a positive result on an HPT, she needs to see her caregiver right away. The caregiver can confirm a positive HPT result with another urine test, a blood test, ultrasound, and a pelvic exam.  °There are two types of blood tests you can get from a caregiver.  °· A quantitative blood test (or the beta hCG test). This test measures the exact amount of hCG in the blood. This means it can pick up very small amounts of hCG, making it a very accurate test. °· A qualitative hCG blood test. This test gives a simple yes or no answer to whether you are pregnant. This test is more like a urine test in terms of its accuracy. °Blood tests can pick up hCG earlier in a pregnancy than urine tests can. Blood tests can tell if you are pregnant about 6 to 8 days after you release an egg from an ovary (ovulate). Urine tests can determine pregnancy about 2 weeks after ovulation.  °HOW IS A HOME PREGNANCY TEST DONE?  °There are many types of home pregnancy tests or HPTs that can be bought over-the-counter at drug or discount stores.  °· Some involve collecting your urine in a cup and dipping a stick into the urine or putting some of the urine into a special container with an eyedropper. °· Others are done by placing a stick into your urine stream. °· Tests vary in how long you need to wait for  the stick or container to turn a certain color or have a symbol on it (like a plus or a minus). °· All tests come with written instructions. Most tests also have toll-free phone numbers to call if you have any questions about how to do the test or read the results. °HOW ACCURATE ARE HOME PREGNANCY TESTS?  °HPTs are very accurate. Most brands of HPTs say they are 97% to 99% accurate when taken 1 week after missing your menstrual period, but this can vary with actual use. Each brand varies in how sensitive it is in picking up the pregnancy hormone hCG. If a test is not done correctly, it will be less accurate. Always check the package to make sure it is not past its expiration date. If it is, it will not be accurate. Most brands of HPTs tell users to do the test again in a few days, no matter what the results.  °If you use an HPT too early in your pregnancy, you may not have enough of the pregnancy hormone hCG in your urine to have a positive test result. Most HPTs will be accurate if you test yourself around the time your period is due (about 2 weeks after you ovulate). You can get a negative test result if you are not pregnant or if you ovulated later than you thought you did.   You may also have problems with the pregnancy, which affects the amount of hCG you have in your urine. If your HPT is negative, test yourself again within a few days to 1 week. If you keep getting a negative result and think you are pregnant, talk with your caregiver right away about getting a blood pregnancy test.  °FALSE POSITIVE PREGNANCY TEST °A false positive HPT can happen if there is blood or protein present in your urine. A false positive can also happen if you were recently pregnant or if you take a pregnancy test too soon after taking fertility drug that contains hCG. Also, some prescription medicines such as water pills (diuretics), tranquilizers, seizure medicines, psychiatric medicines, and allergy and nausea medicines  (promethazine) give false positive readings. °FALSE NEGATIVE PREGNANCY TEST °· A false negative HPT can happen if you do the test too early. Try to wait until you are at least 1 day late for your menstrual period. °· It may happen if you wait too long to test the urine (longer than 15 minutes). °· It may also happen if the urine is too diluted because you drank a lot of fluids before getting the urine sample. It is best to test the first morning urine after you get out of bed. °If your menstrual period did not start after a week of a negative HPT, repeat the pregnancy test. °CAN ANYTHING INTERFERE WITH HOME PREGNANCY TEST RESULTS?  °Most medicines, both over-the-counter and prescription drugs, including birth control pills and antibiotics, should not affect the results of a HPT. Only those drugs that have the pregnancy hormone hCG in them can give a false positive test result. Drugs that have hCG in them may be used for treating infertility (not being able to get pregnant). Alcohol and illegal drugs do not affect HPT results, but you should not be using these substances if you are trying to get pregnant. If you have a positive pregnancy test, call your caregiver to make an appointment to begin prenatal care. °Document Released: 03/12/2003 Document Revised: 06/01/2011 Document Reviewed: 06/23/2013 °[employer]® Patient Information ©2015 [employer]. This information is not intended to replace advice given to you by your health care provider. Make sure you discuss any questions you have with your health care provider. ° °

## 2014-04-24 ENCOUNTER — Other Ambulatory Visit: Payer: Self-pay | Admitting: Internal Medicine

## 2014-04-24 ENCOUNTER — Other Ambulatory Visit: Payer: Medicaid Other

## 2014-04-24 DIAGNOSIS — B20 Human immunodeficiency virus [HIV] disease: Secondary | ICD-10-CM

## 2014-04-24 LAB — COMPREHENSIVE METABOLIC PANEL
ALBUMIN: 3.6 g/dL (ref 3.5–5.2)
ALT: 8 U/L (ref 0–35)
AST: 13 U/L (ref 0–37)
Alkaline Phosphatase: 25 U/L — ABNORMAL LOW (ref 39–117)
BUN: 8 mg/dL (ref 6–23)
CALCIUM: 9.3 mg/dL (ref 8.4–10.5)
CO2: 26 meq/L (ref 19–32)
Chloride: 105 mEq/L (ref 96–112)
Creat: 0.62 mg/dL (ref 0.50–1.10)
Glucose, Bld: 82 mg/dL (ref 70–99)
Potassium: 4.5 mEq/L (ref 3.5–5.3)
Sodium: 138 mEq/L (ref 135–145)
Total Bilirubin: 0.6 mg/dL (ref 0.2–1.2)
Total Protein: 6.9 g/dL (ref 6.0–8.3)

## 2014-04-24 LAB — CBC WITH DIFFERENTIAL/PLATELET
BASOS ABS: 0 10*3/uL (ref 0.0–0.1)
BASOS PCT: 0 % (ref 0–1)
EOS ABS: 0.1 10*3/uL (ref 0.0–0.7)
EOS PCT: 1 % (ref 0–5)
HCT: 40 % (ref 36.0–46.0)
HEMOGLOBIN: 12.7 g/dL (ref 12.0–15.0)
Lymphocytes Relative: 19 % (ref 12–46)
Lymphs Abs: 1.3 10*3/uL (ref 0.7–4.0)
MCH: 29.2 pg (ref 26.0–34.0)
MCHC: 31.8 g/dL (ref 30.0–36.0)
MCV: 92 fL (ref 78.0–100.0)
MPV: 11.2 fL (ref 8.6–12.4)
Monocytes Absolute: 0.4 10*3/uL (ref 0.1–1.0)
Monocytes Relative: 5 % (ref 3–12)
Neutro Abs: 5.3 10*3/uL (ref 1.7–7.7)
Neutrophils Relative %: 75 % (ref 43–77)
PLATELETS: 295 10*3/uL (ref 150–400)
RBC: 4.35 MIL/uL (ref 3.87–5.11)
RDW: 14.1 % (ref 11.5–15.5)
WBC: 7.1 10*3/uL (ref 4.0–10.5)

## 2014-04-25 ENCOUNTER — Emergency Department (HOSPITAL_COMMUNITY)
Admission: EM | Admit: 2014-04-25 | Discharge: 2014-04-25 | Discharge status: Against Medical Advice | Payer: Medicaid Other | Attending: Emergency Medicine | Admitting: Emergency Medicine

## 2014-04-25 ENCOUNTER — Encounter (HOSPITAL_COMMUNITY): Payer: Self-pay | Admitting: Family Medicine

## 2014-04-25 DIAGNOSIS — M545 Low back pain: Secondary | ICD-10-CM | POA: Insufficient documentation

## 2014-04-25 DIAGNOSIS — J45909 Unspecified asthma, uncomplicated: Secondary | ICD-10-CM | POA: Insufficient documentation

## 2014-04-25 DIAGNOSIS — K088 Other specified disorders of teeth and supporting structures: Secondary | ICD-10-CM | POA: Diagnosis not present

## 2014-04-25 LAB — T-HELPER CELL (CD4) - (RCID CLINIC ONLY)
CD4 % Helper T Cell: 38 % (ref 33–55)
CD4 T Cell Abs: 540 /uL (ref 400–2700)

## 2014-04-25 NOTE — ED Notes (Signed)
Pt no answer for ready room x3 

## 2014-04-25 NOTE — ED Notes (Addendum)
Per pt sts back pain for a while and dental pain since this am. sts she has been seen for the back pain before. sts also that she is week and has had positive [facility]

## 2014-04-26 ENCOUNTER — Ambulatory Visit (INDEPENDENT_AMBULATORY_CARE_PROVIDER_SITE_OTHER): Payer: Medicaid Other | Admitting: Internal Medicine

## 2014-04-26 ENCOUNTER — Telehealth: Payer: Self-pay | Admitting: *Deleted

## 2014-04-26 VITALS — BP 122/83 | HR 102 | Temp 99.1°F | Ht 62.0 in | Wt 174.0 lb

## 2014-04-26 DIAGNOSIS — B20 Human immunodeficiency virus [HIV] disease: Secondary | ICD-10-CM

## 2014-04-26 LAB — HIV-1 RNA QUANT-NO REFLEX-BLD
HIV 1 RNA Quant: <20 copies/mL (ref <20)
HIV-1 RNA Quant, Log: <1.3 {Log} (ref <1.30)

## 2014-04-26 NOTE — Telephone Encounter (Signed)
Patient came for last visit today with Dr. Luciana Axeomer; she states she is moving to [place]. Did not have the name of the clinic she will be going to (her boyfriend is seen there). Given our phone number and fax and advised her to sign a release of records in [place] so office notes can be sent. Wendall MolaJacqueline Cockerham

## 2014-04-27 NOTE — Assessment & Plan Note (Signed)
I discussed with her that since she does not have a plan for follow up here, and no follow up arranged with a new clinic in [place], that I will not discuss a new FULL regimen with her now and will defer to her new clinic.  Surprisingly, she is undetectable on Tivicay monotherapy.  I discussed using Triumeq (HLA negative) but since no follow up is arranged, I am hesitant to give without checking labs with a close follow up, therefore I will defer for now and emphasized that she needs to get into a new clinic ASAP.  She voiced her understanding.

## 2014-04-27 NOTE — Progress Notes (Signed)
   Subjective:    Patient ID: [name], female    DOB: 07-26-1985, 29 y.o.   MRN: [id number]  HPI Patient here for follow up after no visit for more than 1 year.  After being on Reyataz norvir truvada, she was changed to Tivicay/truvada after she stopped above due to side effects she attributed to Reyataz.  She took that for a while but stopped Truvada and has been on Tivicay monotherapy for a prolonged period, that she can't quantify.  She also thinks she is pregnant and has had several recent pregnancy tests that are all negative.  She felt Truvada made her stomach "feel funny".  She then told me she is moving to [place] next week and wants a refill of Tivicay to continue monotherapy.    Review of Systems  Constitutional: Negative for fatigue and unexpected weight change.  HENT: Negative for trouble swallowing.   Gastrointestinal: Negative for nausea and diarrhea.  Skin: Negative for rash.  Neurological: Negative for dizziness.       Objective:   Physical Exam  Constitutional: She appears well-developed and well-nourished. No distress.  HENT:  Mouth/Throat: No oropharyngeal exudate.  Eyes: No scleral icterus.  Cardiovascular: Normal rate, regular rhythm and normal heart sounds.   No murmur heard. Pulmonary/Chest: Effort normal and breath sounds normal. No respiratory distress.  Skin: No rash noted.          Assessment & Plan:

## 2016-02-02 ENCOUNTER — Emergency Department (HOSPITAL_COMMUNITY)
Admission: EM | Admit: 2016-02-02 | Discharge: 2016-02-02 | Disposition: A | Discharge status: Home / Self Care | Payer: Medicaid Other | Attending: Emergency Medicine | Admitting: Emergency Medicine

## 2016-02-02 ENCOUNTER — Encounter (HOSPITAL_COMMUNITY): Payer: Self-pay | Admitting: *Deleted

## 2016-02-02 ENCOUNTER — Emergency Department (HOSPITAL_COMMUNITY): Payer: Medicaid Other

## 2016-02-02 DIAGNOSIS — O0281 Inappropriate change in quantitative human chorionic gonadotropin (hCG) in early pregnancy: Secondary | ICD-10-CM | POA: Insufficient documentation

## 2016-02-02 DIAGNOSIS — O26891 Other specified pregnancy related conditions, first trimester: Secondary | ICD-10-CM | POA: Diagnosis present

## 2016-02-02 DIAGNOSIS — J45909 Unspecified asthma, uncomplicated: Secondary | ICD-10-CM | POA: Diagnosis not present

## 2016-02-02 DIAGNOSIS — R51 Headache: Secondary | ICD-10-CM | POA: Insufficient documentation

## 2016-02-02 DIAGNOSIS — R1084 Generalized abdominal pain: Secondary | ICD-10-CM | POA: Diagnosis not present

## 2016-02-02 DIAGNOSIS — Z87891 Personal history of nicotine dependence: Secondary | ICD-10-CM | POA: Diagnosis not present

## 2016-02-02 DIAGNOSIS — Z3A01 Less than 8 weeks gestation of pregnancy: Secondary | ICD-10-CM | POA: Insufficient documentation

## 2016-02-02 LAB — I-STAT BETA HCG BLOOD, ED (MC, WL, AP ONLY): I-stat hCG, quantitative: >2000 m[IU]/mL — ABNORMAL HIGH (ref <5)

## 2016-02-02 LAB — URINE MICROSCOPIC-ADD ON: RBC / HPF: NONE SEEN RBC/hpf (ref 0–5)

## 2016-02-02 LAB — CBC WITH DIFFERENTIAL/PLATELET
BASOS ABS: 0 10*3/uL (ref 0.0–0.1)
BASOS PCT: 0 %
EOS ABS: 0.1 10*3/uL (ref 0.0–0.7)
Eosinophils Relative: 1 %
HCT: 38.3 % (ref 36.0–46.0)
Hemoglobin: 12.8 g/dL (ref 12.0–15.0)
Lymphocytes Relative: 23 %
Lymphs Abs: 1.9 10*3/uL (ref 0.7–4.0)
MCH: 29.9 pg (ref 26.0–34.0)
MCHC: 33.4 g/dL (ref 30.0–36.0)
MCV: 89.5 fL (ref 78.0–100.0)
MONO ABS: 0.5 10*3/uL (ref 0.1–1.0)
MONOS PCT: 6 %
NEUTROS ABS: 5.9 10*3/uL (ref 1.7–7.7)
NEUTROS PCT: 70 %
Platelets: 280 10*3/uL (ref 150–400)
RBC: 4.28 MIL/uL (ref 3.87–5.11)
RDW: 12.7 % (ref 11.5–15.5)
WBC: 8.4 10*3/uL (ref 4.0–10.5)

## 2016-02-02 LAB — COMPREHENSIVE METABOLIC PANEL
ALBUMIN: 3.5 g/dL (ref 3.5–5.0)
ALT: 15 U/L (ref 14–54)
ANION GAP: 9 (ref 5–15)
AST: 17 U/L (ref 15–41)
Alkaline Phosphatase: 21 U/L — ABNORMAL LOW (ref 38–126)
BUN: 10 mg/dL (ref 6–20)
CO2: 21 mmol/L — ABNORMAL LOW (ref 22–32)
Calcium: 9.4 mg/dL (ref 8.9–10.3)
Chloride: 104 mmol/L (ref 101–111)
Creatinine, Ser: 0.57 mg/dL (ref 0.44–1.00)
GFR calc Af Amer: >60 mL/min (ref >60)
GFR calc non Af Amer: >60 mL/min (ref >60)
GLUCOSE: 90 mg/dL (ref 65–99)
POTASSIUM: 3.8 mmol/L (ref 3.5–5.1)
Sodium: 134 mmol/L — ABNORMAL LOW (ref 135–145)
Total Bilirubin: 0.5 mg/dL (ref 0.3–1.2)
Total Protein: 7.8 g/dL (ref 6.5–8.1)

## 2016-02-02 LAB — URINALYSIS, ROUTINE W REFLEX MICROSCOPIC
Bilirubin Urine: NEGATIVE
Glucose, UA: NEGATIVE mg/dL
Hgb urine dipstick: NEGATIVE
KETONES UR: NEGATIVE mg/dL
Nitrite: NEGATIVE
PH: 6 (ref 5.0–8.0)
Protein, ur: NEGATIVE mg/dL
SPECIFIC GRAVITY, URINE: 1.008 (ref 1.005–1.030)

## 2016-02-02 LAB — HCG, QUANTITATIVE, PREGNANCY: hCG, Beta Chain, Quant, S: 62490 m[IU]/mL — ABNORMAL HIGH (ref <5)

## 2016-02-02 LAB — LIPASE, BLOOD: Lipase: 26 U/L (ref 11–51)

## 2016-02-02 MED ORDER — ACETAMINOPHEN 325 MG PO TABS
650.0000 mg | ORAL_TABLET | Freq: Once | ORAL | Status: AC
  Administered 2016-02-02: 650 mg via ORAL
  Filled 2016-02-02: qty 2

## 2016-02-02 MED ORDER — PROMETHAZINE HCL 25 MG PO TABS: 25.0000 mg | ORAL_TABLET | Freq: Four times a day (QID) | ORAL | 0 refills | Status: DC | PRN

## 2016-02-02 MED ORDER — ONDANSETRON 4 MG PO TBDP: 8.0000 mg | ORAL_TABLET | Freq: Once | ORAL | Status: DC

## 2016-02-02 MED ORDER — IBUPROFEN 400 MG PO TABS: 600.0000 mg | ORAL_TABLET | Freq: Once | ORAL | Status: DC

## 2016-02-02 NOTE — Discharge Instructions (Signed)
You can take Tylenol for pain which is safe Please make an appointment for prenatal care

## 2016-02-02 NOTE — ED Notes (Signed)
Called lab to add on hcg 

## 2016-02-02 NOTE — ED Notes (Signed)
Pt ambulatory to restroom to collect urine sample.

## 2016-02-02 NOTE — ED Provider Notes (Signed)
[facility]-EMERGENCY DEPT Provider Note   CSN: [id number] Arrival date & time: 02/02/16  1219     History   Chief Complaint Chief Complaint  Patient presents with  . Abdominal Pain  . Headache  . Cough    HPI [name] is a 30 y.o. 645P3 female who presents with multiple symptoms. Past medical history significant for HIV, asthma, history of STD and ectopic pregnancy in 2012. She states that for the past week she has had generalized abdominal pain, nausea, dry cough. She states that she just found out she has mold in her house and it thinks that that is what is causing her symptoms. She denies any fever, chills, chest pain, shortness of breath, wheezing, vomiting, constipation/diarrhea, urinary symptoms, vaginal discharge or bleeding. She states that she has a infectious disease doctor in [place] and last time her viral load and CD4 counts were checked they were normal however I am unable to see any record of this. She also states that she is been compliant with all her medicines however on her med list she is no antiretrovirals listed. LMP was one month ago.  HPI  Past Medical History:  Diagnosis Date  . Asthma   . Depression    Postpartum after 1st pregnancy  . Hemorrhoids   . HIV positive [facility](HCC)     Patient Active Problem List   Diagnosis Date Noted  . Anemia associated with acute blood loss 01/08/2011  . Ectopic pregnancy 01/07/2011  . VENEREAL WART 07/08/2009  . ASTHMA 07/08/2009  . CHLAMYDIAL INFECTION, HX OF 07/08/2009  . Human immunodeficiency virus (HIV) disease (HCC) 06/04/2009    Past Surgical History:  Procedure Laterality Date  . DILATION AND CURETTAGE OF UTERUS    . LAPAROSCOPY  01/06/2011   Procedure: LAPAROSCOPY OPERATIVE;  Surgeon: Roseanna RainbowLisa A Jackson-Moore, MD;  Location: [facility];  Service: Gynecology;  Laterality: N/A;  . LAPAROTOMY  01/06/2011   Procedure: LAPAROTOMY;  Surgeon: Roseanna RainbowLisa A Jackson-Moore, MD;  Location: [facility];  Service: Gynecology;   Laterality: N/A;  . SALPINGOOPHORECTOMY      OB History    Gravida Para Term Preterm AB Living   5 4 3 1 1 3    SAB TAB Ectopic Multiple Live Births   1 0 0 0 3       Home Medications    Prior to Admission medications   Not on File    Family History Family History  Problem Relation Age of Onset  . Diabetes Mother   . Hypertension Mother   . Hyperlipidemia Mother   . Kidney disease Mother   . Diabetes Brother   . Diabetes Maternal Aunt   . Hypertension Maternal Aunt   . Asthma Daughter     Social History Social History  Substance Use Topics  . Smoking status: Former Smoker    Packs/day: 0.50    Years: 7.00    Types: Cigarettes    Quit date: 03/29/2011  . Smokeless tobacco: Never Used  . Alcohol use No     Allergies   Benadryl [diphenhydramine]   Review of Systems Review of Systems  Constitutional: Positive for fatigue. Negative for chills and fever.  Respiratory: Positive for cough. Negative for shortness of breath and wheezing.   Cardiovascular: Negative for chest pain.  Gastrointestinal: Positive for abdominal pain and nausea. Negative for constipation, diarrhea and vomiting.  Genitourinary: Negative for dysuria, flank pain, frequency, hematuria, menstrual problem, vaginal bleeding and vaginal discharge.  Allergic/Immunologic: Positive for immunocompromised state.  Neurological:  Negative for headaches.  All other systems reviewed and are negative.    Physical Exam Updated Vital Signs BP 129/100 (BP Location: Left Arm)   Pulse 120   Temp 98.3 F (36.8 C) (Oral)   Resp 18   LMP 12/23/2015   SpO2 100%   Physical Exam  Constitutional: She is oriented to person, place, and time. She appears well-developed and well-nourished. No distress.  HENT:  Head: Normocephalic and atraumatic.  Eyes: Conjunctivae are normal. Pupils are equal, round, and reactive to light. Right eye exhibits no discharge. Left eye exhibits no discharge. No scleral icterus.    Neck: Normal range of motion.  Cardiovascular: Normal rate and regular rhythm.  Exam reveals no gallop and no friction rub.   No murmur heard. Pulmonary/Chest: Effort normal and breath sounds normal. No respiratory distress. She has no wheezes. She has no rales. She exhibits no tenderness.  Abdominal: Soft. Bowel sounds are normal. She exhibits no distension and no mass. There is tenderness. There is no rebound and no guarding. No hernia.  Mild, generalized tenderness  Neurological: She is alert and oriented to person, place, and time.  Skin: Skin is warm and dry.  Psychiatric: She has a normal mood and affect. Her behavior is normal.  Nursing note and vitals reviewed.    ED Treatments / Results  Labs (all labs ordered are listed, but only abnormal results are displayed) Labs Reviewed  COMPREHENSIVE METABOLIC PANEL - Abnormal; Notable for the following:       Result Value   Sodium 134 (*)    CO2 21 (*)    Alkaline Phosphatase 21 (*)    All other components within normal limits  URINALYSIS, ROUTINE W REFLEX MICROSCOPIC (NOT AT [facility]) - Abnormal; Notable for the following:    APPearance CLOUDY (*)    Leukocytes, UA LARGE (*)    All other components within normal limits  URINE MICROSCOPIC-ADD ON - Abnormal; Notable for the following:    Squamous Epithelial / LPF 6-30 (*)    Bacteria, UA FEW (*)    All other components within normal limits  HCG, QUANTITATIVE, PREGNANCY - Abnormal; Notable for the following:    hCG, Beta Chain, Quant, S 62,490 (*)    All other components within normal limits  I-STAT BETA HCG BLOOD, ED ([facility], [facility], [facility] ONLY) - Abnormal; Notable for the following:    I-stat hCG, quantitative >2,000.0 (*)    All other components within normal limits  CBC WITH DIFFERENTIAL/PLATELET  LIPASE, BLOOD    EKG  EKG Interpretation None       Radiology No results found.  Procedures Procedures (including critical care time)  Medications Ordered in ED Medications   acetaminophen (TYLENOL) tablet 650 mg (650 mg Oral Given 02/02/16 1450)     Initial Impression / Assessment and Plan / ED Course  I have reviewed the triage vital signs and the nursing notes.  Pertinent labs & imaging results that were available during my care of the patient were reviewed by me and considered in my medical decision making (see chart for details).  Clinical Course    30 year old female presents with multiple symptoms. She has a positive pregnancy test which explains mild abdominal pain, nausea, fatigue. Labs overall unremarkable. UA clean. CXR clear. Her abdominal pain is mild and generalized - doubt EP. I am unsure if she has been taking her ART meds since this is not on her med list however she says she has ben taking them.  I advised close follow up with OBGYN and ID for further management. Rx for Phenergan given. Advised Tylenol for pain and to avoid NSAIDs. Patient is NAD, non-toxic, with stable VS. Patient is informed of clinical course, understands medical decision making process, and agrees with plan. Opportunity for questions provided and all questions answered. Return precautions given.  Final Clinical Impressions(s) / ED Diagnoses   Final diagnoses:  Less than [redacted] weeks gestation of pregnancy    New Prescriptions New Prescriptions   No medications on file     Bethel Born, PA-C 02/05/16 4098    Vanetta Mulders, MD 02/05/16 1535

## 2016-02-02 NOTE — ED Notes (Signed)
Pt states she understands instructions. All questions answered. Pt home ambulatory with steady gait,.

## 2016-02-02 NOTE — ED Triage Notes (Signed)
Pt reports feeling bad all week with abd pain, headache, cough, nausea and fatigue. Has mold in her house and thinks its the cause. No acute distress noted at triage.

## 2016-02-02 NOTE — ED Notes (Signed)
Nurse drawing labs. 

## 2016-02-02 NOTE — ED Notes (Signed)
Pt sitting in room and has remioved monitoring cords.

## 2016-03-04 ENCOUNTER — Encounter: Payer: Medicaid Other | Admitting: Medical

## 2016-03-23 NOTE — L&D Delivery Note (Signed)
Delivery Note Began feeling pressure and FHR was having deep decels   Dr Jolayne Pantheronstant notified and I readied the patient for vacuum assisted delivery.  Verbal consent given.    I applied the Kiwi vacuum and Dr Jolayne Pantheronstant took over with performing vacuum assisted delivery.  No pop-offs.   Head descended well over 2 contractions and delivered easily  Anterior shoulder delivered easily but posterior shoulder required axillary traction (mild) and McRoberts maneuver.    At 1:48 AM a viable and healthy female was delivered via Vaginal, Vacuum (Extractor) (Presentation: ROA;  ).  APGAR: , ; weight  .   Placenta status: spontaneous and grossly intact with 3V Cord:  with the following complications: none.  Cord pH: 7.18  Anesthesia:  epidural Episiotomy: None Lacerations: None Suture Repair: none required Est. Blood Loss (mL): 100  Mom to postpartum.  Baby to Couplet care / Skin to Skin    Plan to continue Magnesium Sulfate for 24 hours.  Wynelle BourgeoisMarie Williams 09/05/2016, 2:07 AM

## 2016-04-01 ENCOUNTER — Other Ambulatory Visit (HOSPITAL_COMMUNITY)
Admission: RE | Admit: 2016-04-01 | Discharge: 2016-04-01 | Disposition: A | Discharge status: Home / Self Care | Payer: Medicaid Other | Source: Ambulatory Visit | Attending: Obstetrics and Gynecology | Admitting: Obstetrics and Gynecology

## 2016-04-01 ENCOUNTER — Encounter: Payer: Self-pay | Admitting: Obstetrics and Gynecology

## 2016-04-01 ENCOUNTER — Ambulatory Visit (INDEPENDENT_AMBULATORY_CARE_PROVIDER_SITE_OTHER): Payer: Medicaid Other | Admitting: Obstetrics and Gynecology

## 2016-04-01 VITALS — BP 112/79 | HR 82

## 2016-04-01 DIAGNOSIS — Z23 Encounter for immunization: Secondary | ICD-10-CM

## 2016-04-01 DIAGNOSIS — Z113 Encounter for screening for infections with a predominantly sexual mode of transmission: Secondary | ICD-10-CM | POA: Insufficient documentation

## 2016-04-01 DIAGNOSIS — O99210 Obesity complicating pregnancy, unspecified trimester: Secondary | ICD-10-CM | POA: Insufficient documentation

## 2016-04-01 DIAGNOSIS — Z01419 Encounter for gynecological examination (general) (routine) without abnormal findings: Secondary | ICD-10-CM | POA: Insufficient documentation

## 2016-04-01 DIAGNOSIS — E669 Obesity, unspecified: Secondary | ICD-10-CM

## 2016-04-01 DIAGNOSIS — B20 Human immunodeficiency virus [HIV] disease: Secondary | ICD-10-CM

## 2016-04-01 DIAGNOSIS — O99212 Obesity complicating pregnancy, second trimester: Secondary | ICD-10-CM

## 2016-04-01 DIAGNOSIS — Z1151 Encounter for screening for human papillomavirus (HPV): Secondary | ICD-10-CM | POA: Diagnosis present

## 2016-04-01 DIAGNOSIS — Z21 Asymptomatic human immunodeficiency virus [HIV] infection status: Secondary | ICD-10-CM

## 2016-04-01 DIAGNOSIS — O98712 Human immunodeficiency virus [HIV] disease complicating pregnancy, second trimester: Secondary | ICD-10-CM

## 2016-04-01 DIAGNOSIS — O0992 Supervision of high risk pregnancy, unspecified, second trimester: Secondary | ICD-10-CM

## 2016-04-01 DIAGNOSIS — Z124 Encounter for screening for malignant neoplasm of cervix: Secondary | ICD-10-CM

## 2016-04-01 MED ORDER — PREPLUS 27-1 MG PO TABS: 1.0000 | ORAL_TABLET | Freq: Every day | ORAL | 13 refills | Status: DC

## 2016-04-01 NOTE — Patient Instructions (Signed)
Pregnancy and HIV HIV (human immunodeficiency virus) is a permanent (chronic) viral infection. HIV kills white blood cells that are called CD4 cells. These cells help to control your body's defense system (immune system) and fight off infection. If you do not have enough CD4 cells, you can develop infections, cancers, and other health problems. With treatment, HIV may not progress as quickly. If it is not treated, HIV progresses from early infection to a life-threatening condition that is called AIDS (acquired immunodeficiency syndrome). It is possible for people with HIV to live for many years in good health and to have families. Is it safe for me to have HIV and become pregnant? Now it is safer than ever for women with HIV to have children. Make sure to involve your HIV providers, case managers, social workers, and other HIV support systems when you are planning to get pregnant. Talk with your health care provider before you get pregnant. If you are already pregnant, talk with your health care provider as soon as possible. Discuss ways to make pregnancy safer for you and your baby. Your health care provider will recommend that you:  Have a complete review of your physical health and medical history, including labs, antiretroviral therapy (ART), and other medicines that you are taking.  Decrease the amount of HIV in your body (your viral load) to be as low as possible (undetectable). You can do this by taking your ART and visiting your health care provider regularly.  Use birth control (contraception) until you are ready to get pregnant.  Stop using alcohol, tobacco, and recreational drugs before you get pregnant, if you are currently taking any of those. You will also need to:  Find a pregnancy provider, such as a nurse-midwife or obstetrician. You need to:  Tell your pregnancy provider that you have HIV.  Take prenatal vitamins.  Go to regularly scheduled prenatal appointments.  Be sure  that your HIV providers and pregnancy providers work together to manage your care. What do I need to consider when trying to get pregnant? Talk to your health care providers to learn about your options and to get the help that you need. If you and your partner both have HIV:  Both of you should be taking ART and should have undetectable viral loads before you try to get pregnant.  Both of you should be tested and treated for any infections of the sex organs before you try to get pregnant.  Only have unprotected sex when you know that you can get pregnant (when you are fertile). Otherwise, use condoms. Ask your health care providers how to figure out when you are fertile during the month. If you have HIV but your female partner does not:  Protect your partner from getting HIV when you are trying to get pregnant.  Use artificial insemination with your partner's semen or a donor's semen. This is the safest way to get pregnant. Self-insemination is possible. Your health care provider can tell you how to do that.  Talk with your health care provider about getting HIV medicines for your partner to decrease his risk of getting HIV. These medicines are called pre-exposure prophylaxis (PrEP). Your partner should begin PrEP a month before you start trying to get pregnant, and he should continue it for a month after you become pregnant.  Only have unprotected sex when you are fertile. Otherwise, use condoms.  Your partner should get tested for HIV on a regular basis.  You should be taking ART and have  an undetectable viral load before you try to get pregnant.  Both of you should be tested and treated for any infections of the sex organs before you try to get pregnant. How will my baby be affected by HIV? There is no way to guarantee that your baby will be born without HIV. However, women who take ART and have an undetectable viral load before and during pregnancy rarely have babies with HIV. Some babies  have side effects from ART after they are born. These problems are minor, and they get better after birth. Talk with your health care provider about your concerns. Do I need to take ART medicine during pregnancy? Yes. The most important thing for you and your baby is for you to take ART and to keep an undetectable viral load. If you are not already on ART, your health care provider will recommend that you start ART as soon as possible. You need to reach an undetectable viral load for your own health and to prevent HIV in your baby. If you are already on ART, you will probably be able to stay on your current ART. However, your health care provider may make some changes to be sure you are getting the best possible treatment. Will HIV affect my labor and delivery? If you have a low viral load:  You will have the same type of labor and delivery as an uninfected woman. This means that you will have a vaginal delivery, or you may have a cesarean delivery if needed. Continue taking your ART on schedule as much as possible. If you have a higher viral load:  You will receive extra care during labor and delivery. Your health care provider will recommend that you have a cesarean delivery at 38 weeks of pregnancy. Starting a few hours before the cesarean delivery, you will receive an HIV medicine called ZDV through an IV tube. Continue taking your ART on schedule as much as possible. What do I need to do after my baby is born?  Keep taking your ART.  See your HIV providers regularly.  Get routine follow-up care from your pregnancy providers. Talk with them about your birth control options. Can I breastfeed my baby? Breastfeeding is not recommended. What do I need to do for my baby?  Start medical care for your baby. Your baby's health care provider (pediatrician) should see your baby shortly after birth and regularly during the first year.  Give your baby all of his or her medicines as directed by the  health care provider. Your baby will be prescribed the medicine ZDV for 4-6 weeks after birth to decrease the risk of HIV. Other drugs may also be prescribed. It is important to start these medicines for your baby as soon after birth as possible. You need to give your baby these medicines every day as directed by the health care provider.  Bottle-feed your baby. Do not give your baby any food that you have chewed, because that could put your baby at increased risk for HIV.  Make sure that your baby gets tested for HIV at all of these ages:  82-30 weeks old.  1-2 months old.  4-6 months old. This information is not intended to replace advice given to you by your health care provider. Make sure you discuss any questions you have with your health care provider. Document Released: 06/15/2000 Document Revised: 08/12/2015 Document Reviewed: 11/21/2013 Elsevier Interactive Patient Education  2017 [employer]. Second Trimester of Pregnancy The second trimester is  from week 13 through week 28 (months 4 through 6). The second trimester is often a time when you feel your best. Your body has also adjusted to being pregnant, and you begin to feel better physically. Usually, morning sickness has lessened or quit completely, you may have more energy, and you may have an increase in appetite. The second trimester is also a time when the fetus is growing rapidly. At the end of the sixth month, the fetus is about 9 inches long and weighs about 1 pounds. You will likely begin to feel the baby move (quickening) between 18 and 20 weeks of the pregnancy. Body changes during your second trimester Your body continues to go through many changes during your second trimester. The changes vary from woman to woman.  Your weight will continue to increase. You will notice your lower abdomen bulging out.  You may begin to get stretch marks on your hips, abdomen, and breasts.  You may develop headaches that can be relieved  by medicines. The medicines should be approved by your health care provider.  You may urinate more often because the fetus is pressing on your bladder.  You may develop or continue to have heartburn as a result of your pregnancy.  You may develop constipation because certain hormones are causing the muscles that push waste through your intestines to slow down.  You may develop hemorrhoids or swollen, bulging veins (varicose veins).  You may have back pain. This is caused by:  Weight gain.  Pregnancy hormones that are relaxing the joints in your pelvis.  A shift in weight and the muscles that support your balance.  Your breasts will continue to grow and they will continue to become tender.  Your gums may bleed and may be sensitive to brushing and flossing.  Dark spots or blotches (chloasma, mask of pregnancy) may develop on your face. This will likely fade after the baby is born.  A dark line from your belly button to the pubic area (linea nigra) may appear. This will likely fade after the baby is born.  You may have changes in your hair. These can include thickening of your hair, rapid growth, and changes in texture. Some women also have hair loss during or after pregnancy, or hair that feels dry or thin. Your hair will most likely return to normal after your baby is born. What to expect at prenatal visits During a routine prenatal visit:  You will be weighed to make sure you and the fetus are growing normally.  Your blood pressure will be taken.  Your abdomen will be measured to track your baby's growth.  The fetal heartbeat will be listened to.  Any test results from the previous visit will be discussed. Your health care provider may ask you:  How you are feeling.  If you are feeling the baby move.  If you have had any abnormal symptoms, such as leaking fluid, bleeding, severe headaches, or abdominal cramping.  If you are using any tobacco products, including  cigarettes, chewing tobacco, and electronic cigarettes.  If you have any questions. Other tests that may be performed during your second trimester include:  Blood tests that check for:  Low iron levels (anemia).  Gestational diabetes (between 24 and 28 weeks).  Rh antibodies. This is to check for a protein on red blood cells (Rh factor).  Urine tests to check for infections, diabetes, or protein in the urine.  An ultrasound to confirm the proper growth and development of  the baby.  An amniocentesis to check for possible genetic problems.  Fetal screens for spina bifida and Down syndrome.  HIV (human immunodeficiency virus) testing. Routine prenatal testing includes screening for HIV, unless you choose not to have this test. Follow these instructions at home: Eating and drinking  Continue to eat regular, healthy meals.  Avoid raw meat, uncooked cheese, cat litter boxes, and soil used by cats. These carry germs that can cause birth defects in the baby.  Take your prenatal vitamins.  Take 1500-2000 mg of calcium daily starting at the 20th week of pregnancy until you deliver your baby.  If you develop constipation:  Take over-the-counter or prescription medicines.  Drink enough fluid to keep your urine clear or pale yellow.  Eat foods that are high in fiber, such as fresh fruits and vegetables, whole grains, and beans.  Limit foods that are high in fat and processed sugars, such as fried and sweet foods. Activity  Exercise only as directed by your health care provider. Experiencing uterine cramps is a good sign to stop exercising.  Avoid heavy lifting, wear low heel shoes, and practice good posture.  Wear your seat belt at all times when driving.  Rest with your legs elevated if you have leg cramps or low back pain.  Wear a good support bra for breast tenderness.  Do not use hot tubs, steam rooms, or saunas. Lifestyle  Avoid all smoking, herbs, alcohol, and  unprescribed drugs. These chemicals affect the formation and growth of the baby.  Do not use any products that contain nicotine or tobacco, such as cigarettes and e-cigarettes. If you need help quitting, ask your health care provider.  A sexual relationship may be continued unless your health care provider directs you otherwise. General instructions  Follow your health care provider's instructions regarding medicine use. There are medicines that are either safe or unsafe to take during pregnancy.  Take warm sitz baths to soothe any pain or discomfort caused by hemorrhoids. Use hemorrhoid cream if your health care provider approves.  If you develop varicose veins, wear support hose. Elevate your feet for 15 minutes, 3-4 times a day. Limit salt in your diet.  Visit your dentist if you have not gone yet during your pregnancy. Use a soft toothbrush to brush your teeth and be gentle when you floss.  Keep all follow-up prenatal visits as told by your health care provider. This is important. Contact a health care provider if:  You have dizziness.  You have mild pelvic cramps, pelvic pressure, or nagging pain in the abdominal area.  You have persistent nausea, vomiting, or diarrhea.  You have a bad smelling vaginal discharge.  You have pain with urination. Get help right away if:  You have a fever.  You are leaking fluid from your vagina.  You have spotting or bleeding from your vagina.  You have severe abdominal cramping or pain.  You have rapid weight gain or weight loss.  You have shortness of breath with chest pain.  You notice sudden or extreme swelling of your face, hands, ankles, feet, or legs.  You have not felt your baby move in over an hour.  You have severe headaches that do not go away with medicine.  You have vision changes. Summary  The second trimester is from week 13 through week 28 (months 4 through 6). It is also a time when the fetus is growing  rapidly.  Your body goes through many changes during pregnancy. The changes  vary from woman to woman.  Avoid all smoking, herbs, alcohol, and unprescribed drugs. These chemicals affect the formation and growth your baby.  Do not use any tobacco products, such as cigarettes, chewing tobacco, and e-cigarettes. If you need help quitting, ask your health care provider.  Contact your health care provider if you have any questions. Keep all prenatal visits as told by your health care provider. This is important. This information is not intended to replace advice given to you by your health care provider. Make sure you discuss any questions you have with your health care provider. Document Released: 03/03/2001 Document Revised: 08/15/2015 Document Reviewed: 05/10/2012 Elsevier Interactive Patient Education  2017 [employer].

## 2016-04-01 NOTE — Progress Notes (Signed)
Informal US @ bedside ;  Single IUP,  FHR = 148 bpm per PW doppler;  FM present;  FL = 2.01 cm (16w 0d)

## 2016-04-01 NOTE — Progress Notes (Signed)
New ob packet given Anatomy US scheduled for January 24th @ 1000.  Pt notified. Home Medicaid Form completed Flu vaccine given

## 2016-04-01 NOTE — Progress Notes (Signed)
Subjective:  [name] is a 31 y.o. [id number] at Unknown gestation being seen today for prenatal care. Limited U/S places [facility]. She reports some flutters. She has had TSVD x 3 in the past without problems. She was Dx with HIV at age 31. Previous took antiviral medication but has not taken for the past 2 years. Last seen by ID 2 yrs ago as well. Viral load and Cd4 count 2 years ago, low. She denies any sequale of HIV. She smokes 1ppd.  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   She is currently monitored for the following issues for this high-risk pregnancy and has Human immunodeficiency virus (HIV) disease (HCC); VENEREAL WART; ASTHMA; CHLAMYDIAL INFECTION, HX OF; and Obesity affecting pregnancy on her problem list.  Patient reports no complaints.  Contractions: Not present. Vag. Bleeding: None.   . Denies leaking of fluid.   The following portions of the patient's history were reviewed and updated as appropriate: allergies, current medications, past family history, past medical history, past social history, past surgical history and problem list. Problem list updated.  Objective:   Vitals:   04/01/16 1106  BP: 112/79  Pulse: 82    Fetal Status: Fetal Heart Rate (bpm): 148         General:  Alert, oriented and cooperative. Patient is in no acute distress.  Skin: Skin is warm and dry. No rash noted.   Cardiovascular: Normal heart rate noted  Respiratory: Normal respiratory effort, no problems with respiration noted  Abdomen: Soft, gravid, appropriate for gestational age. Pain/Pressure: Present     Pelvic:  Cervical exam performed        Extremities: Normal range of motion.  Edema: None  Mental Status: Normal mood and affect. Normal behavior. Normal judgment and thought content.   Urinalysis:      Assessment and Plan:  Pregnancy: [id number] at Unknown  1. Pregnancy, supervision, high-risk,  second trimester Pregnancy and HIV reviewed with pt and partner. Prenatal care reviewed as well. Desires quad screen, draw at next OB visit after EDD confirmed by U/S  - Prenatal Profile - GC/Chlamydia probe amp ([place] - Hemoglobinopathy evaluation - Culture, OB Urine - Pain Mgmt, Profile 6 Conf w/o mM, U - Flu Vaccine QUAD 36+ mos IM (Fluarix, Quad PF) - US MFM OB COMP + 14 WK; Future - Cytology - PAP - Prenatal Vit-Fe Fumarate-FA (PREPLUS) 27-1 MG TABS; Take 1 tablet by mouth daily.  Dispense: 30 tablet; Refill: 13  2. Human immunodeficiency virus (HIV) disease (HCC) Referred to ID  3. Obesity affecting pregnancy in second trimester Check 2 hr glucola and HgbA1C  Preterm labor symptoms and general obstetric precautions including but not limited to vaginal bleeding, contractions, leaking of fluid and fetal movement were reviewed in detail with the patient. Please refer to After Visit Summary for other counseling recommendations.  Return in about 4 weeks (around 04/29/2016) for OB visit.   Hermina StaggersMichael L Yannis Gumbs, MD

## 2016-04-02 ENCOUNTER — Telehealth: Payer: Self-pay

## 2016-04-02 LAB — PAIN MGMT, PROFILE 6 CONF W/O MM, U
6 ACETYLMORPHINE: NEGATIVE ng/mL (ref <10)
ALCOHOL METABOLITES: NEGATIVE ng/mL (ref <500)
Amphetamines: NEGATIVE ng/mL (ref <500)
BENZODIAZEPINES: NEGATIVE ng/mL (ref <100)
Barbiturates: NEGATIVE ng/mL (ref <300)
COCAINE METABOLITE: NEGATIVE ng/mL (ref <150)
Creatinine: 197.7 mg/dL (ref >=20.0)
Marijuana Metabolite: NEGATIVE ng/mL (ref <20)
Methadone Metabolite: NEGATIVE ng/mL (ref <100)
Opiates: NEGATIVE ng/mL (ref <100)
Oxidant: NEGATIVE ug/mL (ref <200)
Oxycodone: NEGATIVE ng/mL (ref <100)
PH: 6.69 (ref 4.5–9.0)
PHENCYCLIDINE: NEGATIVE ng/mL (ref <25)
Please note:: 0

## 2016-04-02 LAB — CYTOLOGY - PAP
DIAGNOSIS: NEGATIVE
HPV: NOT DETECTED

## 2016-04-02 LAB — PRENATAL PROFILE (SOLSTAS)
Antibody Screen: NEGATIVE
Basophils Absolute: 0 {cells}/uL (ref 0–200)
Basophils Relative: 0 %
Eosinophils Absolute: 78 {cells}/uL (ref 15–500)
Eosinophils Relative: 1 %
HCT: 33.3 % — ABNORMAL LOW (ref 35.0–45.0)
HIV 1&2 Ab, 4th Generation: REACTIVE — AB
Hemoglobin: 10.7 g/dL — ABNORMAL LOW (ref 11.7–15.5)
Hepatitis B Surface Ag: NEGATIVE
Lymphocytes Relative: 16 %
Lymphs Abs: 1248 {cells}/uL (ref 850–3900)
MCH: 29.2 pg (ref 27.0–33.0)
MCHC: 32.1 g/dL (ref 32.0–36.0)
MCV: 91 fL (ref 80.0–100.0)
MPV: 10.7 fL (ref 7.5–12.5)
Monocytes Absolute: 312 {cells}/uL (ref 200–950)
Monocytes Relative: 4 %
Neutro Abs: 6162 {cells}/uL (ref 1500–7800)
Neutrophils Relative %: 79 %
Platelets: 267 K/uL (ref 140–400)
RBC: 3.66 MIL/uL — ABNORMAL LOW (ref 3.80–5.10)
RDW: 13.4 % (ref 11.0–15.0)
Rh Type: POSITIVE
Rubella: 2.49 {index} — ABNORMAL HIGH
WBC: 7.8 K/uL (ref 3.8–10.8)

## 2016-04-02 LAB — HEMOGLOBIN A1C
HEMOGLOBIN A1C: 5.4 % (ref <5.7)
MEAN PLASMA GLUCOSE: 108 mg/dL

## 2016-04-02 LAB — GC/CHLAMYDIA PROBE AMP (~~LOC~~) NOT AT ARMC
Chlamydia: NEGATIVE
Neisseria Gonorrhea: NEGATIVE

## 2016-04-02 LAB — HIV 1/2 CONFIRMATION
HIV 1 ANTIBODY: POSITIVE — AB
HIV-2 Ab: NEGATIVE

## 2016-04-02 NOTE — Telephone Encounter (Signed)
[facility] calling with referral for patient. She is estimated [redacted] weeks pregnant and out of care.  I have attempted to reach the patient at her listed number and no voice mail is available.  I have also called her emergency contact number and no one answered the phone.   I have contacted [facility] and left message for Elfredia NevinsCorey Barr since we are not able to locate patient.  We would like to start this patient on medications as soon as possible and get her into care.   I will await a call from [place].  [facility] has been informed we are not able to contact patient.   Laurell Josephsammy K Theodora Lalanne, RN

## 2016-04-03 LAB — CULTURE, OB URINE

## 2016-04-07 ENCOUNTER — Encounter (HOSPITAL_COMMUNITY): Payer: Self-pay | Admitting: Obstetrics and Gynecology

## 2016-04-15 ENCOUNTER — Encounter (HOSPITAL_COMMUNITY): Payer: Self-pay

## 2016-04-15 ENCOUNTER — Ambulatory Visit (HOSPITAL_COMMUNITY)
Admission: RE | Admit: 2016-04-15 | Discharge: 2016-04-15 | Disposition: A | Discharge status: Home / Self Care | Payer: Medicaid Other | Source: Ambulatory Visit | Attending: Obstetrics and Gynecology | Admitting: Obstetrics and Gynecology

## 2016-04-15 DIAGNOSIS — O09212 Supervision of pregnancy with history of pre-term labor, second trimester: Secondary | ICD-10-CM | POA: Diagnosis not present

## 2016-04-15 DIAGNOSIS — O0992 Supervision of high risk pregnancy, unspecified, second trimester: Secondary | ICD-10-CM

## 2016-04-15 DIAGNOSIS — O98712 Human immunodeficiency virus [HIV] disease complicating pregnancy, second trimester: Secondary | ICD-10-CM | POA: Diagnosis not present

## 2016-04-15 DIAGNOSIS — Z3A18 18 weeks gestation of pregnancy: Secondary | ICD-10-CM | POA: Diagnosis not present

## 2016-04-15 DIAGNOSIS — O99332 Smoking (tobacco) complicating pregnancy, second trimester: Secondary | ICD-10-CM | POA: Diagnosis not present

## 2016-04-15 DIAGNOSIS — Z3689 Encounter for other specified antenatal screening: Secondary | ICD-10-CM | POA: Insufficient documentation

## 2016-04-15 DIAGNOSIS — Z3687 Encounter for antenatal screening for uncertain dates: Secondary | ICD-10-CM | POA: Insufficient documentation

## 2016-04-16 ENCOUNTER — Telehealth: Payer: Self-pay | Admitting: *Deleted

## 2016-04-16 NOTE — Telephone Encounter (Signed)
Pt had US yesterday and needs follow up for growth in 6 weeks (approx 3/7). I called pt and was not able to leave a message due to no voice mail set up. Note added to pt's appt note on 2/6.

## 2016-04-23 ENCOUNTER — Telehealth: Payer: Self-pay

## 2016-04-23 NOTE — Telephone Encounter (Signed)
Case Manager with [facility] calling.  Enid BaasLinda Connor  This [facility].   The patient is pregnant which puts her at high risk and the [place] office may not be best fit.  Case Manager informed I have been trying to reach the patient and she never returned my call.   Since she was pregnant I made a referral to [facility] to reach out and bring her into care.   Per Case Manager patient stated seeing missed calls but never attempted to return our call.  Patient stated she called our office and was told the office was flooded and [facility].  Appointment scheduled for April 30, 2016 with Dr Drue SecondSnider.  Case Manager has asked for a sooner date and I have suggested she pharmacist first.   Case Manager tried to call patient with appointment information and no one answered the phone.  Message was left to call our office.   I will speak with Dr Drue SecondSnider so she is aware of possible pharmacy visit prior to physician visit.

## 2016-04-24 DIAGNOSIS — O99212 Obesity complicating pregnancy, second trimester: Secondary | ICD-10-CM | POA: Diagnosis not present

## 2016-04-24 DIAGNOSIS — O0992 Supervision of high risk pregnancy, unspecified, second trimester: Secondary | ICD-10-CM | POA: Diagnosis not present

## 2016-04-24 DIAGNOSIS — B20 Human immunodeficiency virus [HIV] disease: Secondary | ICD-10-CM | POA: Diagnosis not present

## 2016-04-24 NOTE — Telephone Encounter (Signed)
04-24-16  [facility].   [facility].   Azucena CecilKawanna , [place] Counselor spoke with patient earlier in the week and advised her to connect with care in [place] since transportation was an issue.   At this point we are not sure if she will follow up with [facility] but we now know she is on mediation.   New regimen is in EPIC.    I will follow up with a phone call to patient.   Laurell Josephsammy K Leilani Cespedes, RN

## 2016-04-28 ENCOUNTER — Other Ambulatory Visit: Payer: Self-pay | Admitting: *Deleted

## 2016-04-28 ENCOUNTER — Encounter: Payer: Medicaid Other | Admitting: Family Medicine

## 2016-04-28 DIAGNOSIS — O0990 Supervision of high risk pregnancy, unspecified, unspecified trimester: Secondary | ICD-10-CM

## 2016-04-30 ENCOUNTER — Ambulatory Visit: Payer: Medicaid Other | Admitting: Internal Medicine

## 2016-05-26 ENCOUNTER — Ambulatory Visit (HOSPITAL_COMMUNITY): Admission: RE | Admit: 2016-05-26 | Payer: Medicaid Other | Source: Ambulatory Visit

## 2016-06-08 ENCOUNTER — Ambulatory Visit (INDEPENDENT_AMBULATORY_CARE_PROVIDER_SITE_OTHER): Payer: Medicaid Other | Admitting: Obstetrics & Gynecology

## 2016-06-08 VITALS — BP 110/61 | HR 111 | Wt 208.2 lb

## 2016-06-08 DIAGNOSIS — O09899 Supervision of other high risk pregnancies, unspecified trimester: Secondary | ICD-10-CM | POA: Insufficient documentation

## 2016-06-08 DIAGNOSIS — O98712 Human immunodeficiency virus [HIV] disease complicating pregnancy, second trimester: Secondary | ICD-10-CM | POA: Diagnosis present

## 2016-06-08 DIAGNOSIS — O09212 Supervision of pregnancy with history of pre-term labor, second trimester: Secondary | ICD-10-CM | POA: Diagnosis not present

## 2016-06-08 DIAGNOSIS — Z21 Asymptomatic human immunodeficiency virus [HIV] infection status: Secondary | ICD-10-CM

## 2016-06-08 DIAGNOSIS — O99212 Obesity complicating pregnancy, second trimester: Secondary | ICD-10-CM

## 2016-06-08 DIAGNOSIS — O09219 Supervision of pregnancy with history of pre-term labor, unspecified trimester: Secondary | ICD-10-CM

## 2016-06-08 DIAGNOSIS — O0990 Supervision of high risk pregnancy, unspecified, unspecified trimester: Secondary | ICD-10-CM | POA: Insufficient documentation

## 2016-06-08 DIAGNOSIS — E669 Obesity, unspecified: Secondary | ICD-10-CM

## 2016-06-08 DIAGNOSIS — B20 Human immunodeficiency virus [HIV] disease: Secondary | ICD-10-CM

## 2016-06-08 LAB — POCT URINALYSIS DIP (DEVICE)
Bilirubin Urine: NEGATIVE
Glucose, UA: NEGATIVE mg/dL
Hgb urine dipstick: NEGATIVE
KETONES UR: NEGATIVE mg/dL
NITRITE: NEGATIVE
PH: 7 (ref 5.0–8.0)
Protein, ur: NEGATIVE mg/dL
Specific Gravity, Urine: 1.015 (ref 1.005–1.030)
Urobilinogen, UA: 1 mg/dL (ref 0.0–1.0)

## 2016-06-08 MED ORDER — DOCUSATE SODIUM 100 MG PO CAPS: 100.0000 mg | ORAL_CAPSULE | Freq: Two times a day (BID) | ORAL | 2 refills | Status: DC | PRN

## 2016-06-08 MED ORDER — FERROUS SULFATE 325 (65 FE) MG PO TABS: 325.0000 mg | ORAL_TABLET | Freq: Every day | ORAL | 3 refills | Status: DC

## 2016-06-08 NOTE — Progress Notes (Addendum)
   PRENATAL VISIT NOTE  Subjective:  [name] is a 31 y.o. [phone] at 264w0d being seen today for ongoing prenatal care.  She is currently monitored for the following issues for this high-risk pregnancy and has Human immunodeficiency virus (HIV) disease (HCC); VENEREAL WART; ASTHMA; CHLAMYDIAL INFECTION, HX OF; Obesity affecting pregnancy; Supervision of high risk pregnancy, antepartum; and History of preterm delivery, currently pregnant on her problem list.  Patient reports problems getting comfortable at night; suggested pillow supports.  Contractions: Not present. Vag. Bleeding: None.  Movement: Present. Denies leaking of fluid.   The following portions of the patient's history were reviewed and updated as appropriate: allergies, current medications, past family history, past medical history, past social history, past surgical history and problem list. Problem list updated.  Objective:   Vitals:   06/08/16 0923  BP: 110/61  Pulse: (!) 111  Weight: 208 lb 3.2 oz (94.4 kg)    Fetal Status: Fetal Heart Rate (bpm): 154 Fundal Height: 28 cm Movement: Present     General:  Alert, oriented and cooperative. Patient is in no acute distress.  Skin: Skin is warm and dry. No rash noted.   Cardiovascular: Normal heart rate noted  Respiratory: Normal respiratory effort, no problems with respiration noted  Abdomen: Soft, gravid, appropriate for gestational age. Pain/Pressure: Present     Pelvic:  Cervical exam deferred        Extremities: Normal range of motion.  Edema: None  Mental Status: Normal mood and affect. Normal behavior. Normal judgment and thought content.   Assessment and Plan:  Pregnancy: [id number] at 6264w0d  1. Human immunodeficiency virus (HIV) disease (HCC) 135 RNA viral load ([place] everywhere)  2. Obesity affecting pregnancy in second trimester Didn't do early 1 hour (lost to care).  Will do regular screening at 26-28 weeks  3. History of preterm delivery,  currently pregnant Missed window to take 17P; reviewed signs and symptoms of PTL.  4.  Anemias (hgb 10.3) Start FeS04 and colace--pt desires to start.  Preterm labor symptoms and general obstetric precautions including but not limited to vaginal bleeding, contractions, leaking of fluid and fetal movement were reviewed in detail with the patient. Please refer to After Visit Summary for other counseling recommendations.  Return in about 4 weeks (around 07/06/2016).   Lesly DukesKelly H Princes Finger, MD

## 2016-06-08 NOTE — Patient Instructions (Signed)
Contraception Choices Contraception (birth control) is the use of any methods or devices to prevent pregnancy. Below are some methods to help avoid pregnancy. Hormonal methods  Contraceptive implant. This is a thin, plastic tube containing progesterone hormone. It does not contain estrogen hormone. Your health care provider inserts the tube in the inner part of the upper arm. The tube can remain in place for up to 3 years. After 3 years, the implant must be removed. The implant prevents the ovaries from releasing an egg (ovulation), thickens the cervical mucus to prevent sperm from entering the uterus, and thins the lining of the inside of the uterus.  Progesterone-only injections. These injections are given every 3 months by your health care provider to prevent pregnancy. This synthetic progesterone hormone stops the ovaries from releasing eggs. It also thickens cervical mucus and changes the uterine lining. This makes it harder for sperm to survive in the uterus.  Birth control pills. These pills contain estrogen and progesterone hormone. They work by preventing the ovaries from releasing eggs (ovulation). They also cause the cervical mucus to thicken, preventing the sperm from entering the uterus. Birth control pills are prescribed by a health care provider.Birth control pills can also be used to treat heavy periods.  Minipill. This type of birth control pill contains only the progesterone hormone. They are taken every day of each month and must be prescribed by your health care provider.  Birth control patch. The patch contains hormones similar to those in birth control pills. It must be changed once a week and is prescribed by a health care provider.  Vaginal ring. The ring contains hormones similar to those in birth control pills. It is left in the vagina for 3 weeks, removed for 1 week, and then a new one is put back in place. The patient must be comfortable inserting and removing the ring from  the vagina.A health care provider's prescription is necessary.  Emergency contraception. Emergency contraceptives prevent pregnancy after unprotected sexual intercourse. This pill can be taken right after sex or up to 5 days after unprotected sex. It is most effective the sooner you take the pills after having sexual intercourse. Most emergency contraceptive pills are available without a prescription. Check with your pharmacist. Do not use emergency contraception as your only form of birth control. Barrier methods  Female condom. This is a thin sheath (latex or rubber) that is worn over the penis during sexual intercourse. It can be used with spermicide to increase effectiveness.  Female condom. This is a soft, loose-fitting sheath that is put into the vagina before sexual intercourse.  Diaphragm. This is a soft, latex, dome-shaped barrier that must be fitted by a health care provider. It is inserted into the vagina, along with a spermicidal jelly. It is inserted before intercourse. The diaphragm should be left in the vagina for 6 to 8 hours after intercourse.  Cervical cap. This is a round, soft, latex or plastic cup that fits over the cervix and must be fitted by a health care provider. The cap can be left in place for up to 48 hours after intercourse.  Sponge. This is a soft, circular piece of polyurethane foam. The sponge has spermicide in it. It is inserted into the vagina after wetting it and before sexual intercourse.  Spermicides. These are chemicals that kill or block sperm from entering the cervix and uterus. They come in the form of creams, jellies, suppositories, foam, or tablets. They do not require a prescription. They   are inserted into the vagina with an applicator before having sexual intercourse. The process must be repeated every time you have sexual intercourse. Intrauterine contraception  Intrauterine device (IUD). This is a T-shaped device that is put in a woman's uterus during  a menstrual period to prevent pregnancy. There are 2 types: ? Copper IUD. This type of IUD is wrapped in copper wire and is placed inside the uterus. Copper makes the uterus and fallopian tubes produce a fluid that kills sperm. It can stay in place for 10 years. ? Hormone IUD. This type of IUD contains the hormone progestin (synthetic progesterone). The hormone thickens the cervical mucus and prevents sperm from entering the uterus, and it also thins the uterine lining to prevent implantation of a fertilized egg. The hormone can weaken or kill the sperm that get into the uterus. It can stay in place for 3-5 years, depending on which type of IUD is used. Permanent methods of contraception  Female tubal ligation. This is when the woman's fallopian tubes are surgically sealed, tied, or blocked to prevent the egg from traveling to the uterus.  Hysteroscopic sterilization. This involves placing a small coil or insert into each fallopian tube. Your doctor uses a technique called hysteroscopy to do the procedure. The device causes scar tissue to form. This results in permanent blockage of the fallopian tubes, so the sperm cannot fertilize the egg. It takes about 3 months after the procedure for the tubes to become blocked. You must use another form of birth control for these 3 months.  Female sterilization. This is when the female has the tubes that carry sperm tied off (vasectomy).This blocks sperm from entering the vagina during sexual intercourse. After the procedure, the man can still ejaculate fluid (semen). Natural planning methods  Natural family planning. This is not having sexual intercourse or using a barrier method (condom, diaphragm, cervical cap) on days the woman could become pregnant.  Calendar method. This is keeping track of the length of each menstrual cycle and identifying when you are fertile.  Ovulation method. This is avoiding sexual intercourse during ovulation.  Symptothermal method.  This is avoiding sexual intercourse during ovulation, using a thermometer and ovulation symptoms.  Post-ovulation method. This is timing sexual intercourse after you have ovulated. Regardless of which type or method of contraception you choose, it is important that you use condoms to protect against the transmission of sexually transmitted infections (STIs). Talk with your health care provider about which form of contraception is most appropriate for you. This information is not intended to replace advice given to you by your health care provider. Make sure you discuss any questions you have with your health care provider. Document Released: 03/09/2005 Document Revised: 08/15/2015 Document Reviewed: 09/01/2012 Elsevier Interactive Patient Education  2017 [employer]  

## 2016-06-12 ENCOUNTER — Ambulatory Visit (HOSPITAL_COMMUNITY): Admission: RE | Admit: 2016-06-12 | Payer: Medicaid Other | Source: Ambulatory Visit

## 2016-06-22 ENCOUNTER — Encounter (HOSPITAL_COMMUNITY): Payer: Self-pay

## 2016-06-22 ENCOUNTER — Other Ambulatory Visit: Payer: Self-pay | Admitting: Family Medicine

## 2016-06-22 ENCOUNTER — Ambulatory Visit (HOSPITAL_COMMUNITY)
Admission: RE | Admit: 2016-06-22 | Discharge: 2016-06-22 | Disposition: A | Discharge status: Home / Self Care | Payer: Medicaid Other | Source: Ambulatory Visit | Attending: Family Medicine | Admitting: Family Medicine

## 2016-06-22 DIAGNOSIS — O99332 Smoking (tobacco) complicating pregnancy, second trimester: Secondary | ICD-10-CM

## 2016-06-22 DIAGNOSIS — O9989 Other specified diseases and conditions complicating pregnancy, childbirth and the puerperium: Secondary | ICD-10-CM | POA: Diagnosis not present

## 2016-06-22 DIAGNOSIS — B20 Human immunodeficiency virus [HIV] disease: Secondary | ICD-10-CM | POA: Diagnosis not present

## 2016-06-22 DIAGNOSIS — Z362 Encounter for other antenatal screening follow-up: Secondary | ICD-10-CM | POA: Diagnosis not present

## 2016-06-22 DIAGNOSIS — O98712 Human immunodeficiency virus [HIV] disease complicating pregnancy, second trimester: Secondary | ICD-10-CM

## 2016-06-22 DIAGNOSIS — J45909 Unspecified asthma, uncomplicated: Secondary | ICD-10-CM | POA: Diagnosis not present

## 2016-06-22 DIAGNOSIS — Z3A27 27 weeks gestation of pregnancy: Secondary | ICD-10-CM

## 2016-06-22 DIAGNOSIS — O0993 Supervision of high risk pregnancy, unspecified, third trimester: Secondary | ICD-10-CM | POA: Insufficient documentation

## 2016-06-22 DIAGNOSIS — O99333 Smoking (tobacco) complicating pregnancy, third trimester: Secondary | ICD-10-CM | POA: Diagnosis not present

## 2016-06-22 DIAGNOSIS — O0990 Supervision of high risk pregnancy, unspecified, unspecified trimester: Secondary | ICD-10-CM | POA: Diagnosis present

## 2016-06-22 DIAGNOSIS — Z3A28 28 weeks gestation of pregnancy: Secondary | ICD-10-CM | POA: Diagnosis not present

## 2016-06-22 DIAGNOSIS — O98713 Human immunodeficiency virus [HIV] disease complicating pregnancy, third trimester: Secondary | ICD-10-CM | POA: Diagnosis not present

## 2016-06-24 ENCOUNTER — Other Ambulatory Visit (HOSPITAL_COMMUNITY): Payer: Self-pay | Admitting: *Deleted

## 2016-06-24 DIAGNOSIS — O09219 Supervision of pregnancy with history of pre-term labor, unspecified trimester: Secondary | ICD-10-CM

## 2016-07-07 ENCOUNTER — Ambulatory Visit (INDEPENDENT_AMBULATORY_CARE_PROVIDER_SITE_OTHER): Payer: Medicaid Other | Admitting: Obstetrics & Gynecology

## 2016-07-07 ENCOUNTER — Encounter: Payer: Self-pay | Admitting: Obstetrics & Gynecology

## 2016-07-07 VITALS — BP 117/83 | HR 113 | Wt 213.0 lb

## 2016-07-07 DIAGNOSIS — O09213 Supervision of pregnancy with history of pre-term labor, third trimester: Secondary | ICD-10-CM

## 2016-07-07 DIAGNOSIS — B20 Human immunodeficiency virus [HIV] disease: Secondary | ICD-10-CM

## 2016-07-07 DIAGNOSIS — R8290 Unspecified abnormal findings in urine: Secondary | ICD-10-CM

## 2016-07-07 DIAGNOSIS — O0990 Supervision of high risk pregnancy, unspecified, unspecified trimester: Secondary | ICD-10-CM

## 2016-07-07 DIAGNOSIS — O98713 Human immunodeficiency virus [HIV] disease complicating pregnancy, third trimester: Secondary | ICD-10-CM | POA: Diagnosis not present

## 2016-07-07 DIAGNOSIS — Z21 Asymptomatic human immunodeficiency virus [HIV] infection status: Secondary | ICD-10-CM

## 2016-07-07 DIAGNOSIS — O0993 Supervision of high risk pregnancy, unspecified, third trimester: Secondary | ICD-10-CM

## 2016-07-07 DIAGNOSIS — O09219 Supervision of pregnancy with history of pre-term labor, unspecified trimester: Secondary | ICD-10-CM

## 2016-07-07 DIAGNOSIS — O09899 Supervision of other high risk pregnancies, unspecified trimester: Secondary | ICD-10-CM

## 2016-07-07 LAB — POCT URINALYSIS DIP (DEVICE)
Bilirubin Urine: NEGATIVE
GLUCOSE, UA: NEGATIVE mg/dL
HGB URINE DIPSTICK: NEGATIVE
Ketones, ur: NEGATIVE mg/dL
Nitrite: NEGATIVE
PH: 7 (ref 5.0–8.0)
Protein, ur: 30 mg/dL — AB
SPECIFIC GRAVITY, URINE: 1.02 (ref 1.005–1.030)
UROBILINOGEN UA: 1 mg/dL (ref 0.0–1.0)

## 2016-07-07 MED ORDER — TETANUS-DIPHTH-ACELL PERTUSSIS 5-2.5-18.5 LF-MCG/0.5 IM SUSP
0.5000 mL | Freq: Once | INTRAMUSCULAR | Status: AC
  Administered 2016-07-07: 0.5 mL via INTRAMUSCULAR

## 2016-07-07 NOTE — Patient Instructions (Signed)
Third Trimester of Pregnancy The third trimester is from week 28 through week 40 (months 7 through 9). The third trimester is a time when the unborn baby (fetus) is growing rapidly. At the end of the ninth month, the fetus is about 20 inches in length and weighs 6-10 pounds. Body changes during your third trimester Your body will continue to go through many changes during pregnancy. The changes vary from woman to woman. During the third trimester:  Your weight will continue to increase. You can expect to gain 25-35 pounds (11-16 kg) by the end of the pregnancy.  You may begin to get stretch marks on your hips, abdomen, and breasts.  You may urinate more often because the fetus is moving lower into your pelvis and pressing on your bladder.  You may develop or continue to have heartburn. This is caused by increased hormones that slow down muscles in the digestive tract.  You may develop or continue to have constipation because increased hormones slow digestion and cause the muscles that push waste through your intestines to relax.  You may develop hemorrhoids. These are swollen veins (varicose veins) in the rectum that can itch or be painful.  You may develop swollen, bulging veins (varicose veins) in your legs.  You may have increased body aches in the pelvis, back, or thighs. This is due to weight gain and increased hormones that are relaxing your joints.  You may have changes in your hair. These can include thickening of your hair, rapid growth, and changes in texture. Some women also have hair loss during or after pregnancy, or hair that feels dry or thin. Your hair will most likely return to normal after your baby is born.  Your breasts will continue to grow and they will continue to become tender. A yellow fluid (colostrum) may leak from your breasts. This is the first milk you are producing for your baby.  Your belly button may stick out.  You may notice more swelling in your hands,  face, or ankles.  You may have increased tingling or numbness in your hands, arms, and legs. The skin on your belly may also feel numb.  You may feel short of breath because of your expanding uterus.  You may have more problems sleeping. This can be caused by the size of your belly, increased need to urinate, and an increase in your body's metabolism.  You may notice the fetus "dropping," or moving lower in your abdomen (lightening).  You may have increased vaginal discharge.  You may notice your joints feel loose and you may have pain around your pelvic bone.  What to expect at prenatal visits You will have prenatal exams every 2 weeks until week 36. Then you will have weekly prenatal exams. During a routine prenatal visit:  You will be weighed to make sure you and the baby are growing normally.  Your blood pressure will be taken.  Your abdomen will be measured to track your baby's growth.  The fetal heartbeat will be listened to.  Any test results from the previous visit will be discussed.  You may have a cervical check near your due date to see if your cervix has softened or thinned (effaced).  You will be tested for Group B streptococcus. This happens between 35 and 37 weeks.  Your health care provider may ask you:  What your birth plan is.  How you are feeling.  If you are feeling the baby move.  If you have had   any abnormal symptoms, such as leaking fluid, bleeding, severe headaches, or abdominal cramping.  If you are using any tobacco products, including cigarettes, chewing tobacco, and electronic cigarettes.  If you have any questions.  Other tests or screenings that may be performed during your third trimester include:  Blood tests that check for low iron levels (anemia).  Fetal testing to check the health, activity level, and growth of the fetus. Testing is done if you have certain medical conditions or if there are problems during the  pregnancy.  Nonstress test (NST). This test checks the health of your baby to make sure there are no signs of problems, such as the baby not getting enough oxygen. During this test, a belt is placed around your belly. The baby is made to move, and its heart rate is monitored during movement.  What is false labor? False labor is a condition in which you feel small, irregular tightenings of the muscles in the womb (contractions) that usually go away with rest, changing position, or drinking water. These are called Braxton Hicks contractions. Contractions may last for hours, days, or even weeks before true labor sets in. If contractions come at regular intervals, become more frequent, increase in intensity, or become painful, you should see your health care provider. What are the signs of labor?  Abdominal cramps.  Regular contractions that start at 10 minutes apart and become stronger and more frequent with time.  Contractions that start on the top of the uterus and spread down to the lower abdomen and back.  Increased pelvic pressure and dull back pain.  A watery or bloody mucus discharge that comes from the vagina.  Leaking of amniotic fluid. This is also known as your "water breaking." It could be a slow trickle or a gush. Let your health care provider know if it has a color or strange odor. If you have any of these signs, call your health care provider right away, even if it is before your due date. Follow these instructions at home: Medicines  Follow your health care provider's instructions regarding medicine use. Specific medicines may be either safe or unsafe to take during pregnancy.  Take a prenatal vitamin that contains at least 600 micrograms (mcg) of folic acid.  If you develop constipation, try taking a stool softener if your health care provider approves. Eating and drinking  Eat a balanced diet that includes fresh fruits and vegetables, whole grains, good sources of protein  such as meat, eggs, or tofu, and low-fat dairy. Your health care provider will help you determine the amount of weight gain that is right for you.  Avoid raw meat and uncooked cheese. These carry germs that can cause birth defects in the baby.  If you have low calcium intake from food, talk to your health care provider about whether you should take a daily calcium supplement.  Eat four or five small meals rather than three large meals a day.  Limit foods that are high in fat and processed sugars, such as fried and sweet foods.  To prevent constipation: ? Drink enough fluid to keep your urine clear or pale yellow. ? Eat foods that are high in fiber, such as fresh fruits and vegetables, whole grains, and beans. Activity  Exercise only as directed by your health care provider. Most women can continue their usual exercise routine during pregnancy. Try to exercise for 30 minutes at least 5 days a week. Stop exercising if you experience uterine contractions.  Avoid heavy   lifting.  Do not exercise in extreme heat or humidity, or at high altitudes.  Wear low-heel, comfortable shoes.  Practice good posture.  You may continue to have sex unless your health care provider tells you otherwise. Relieving pain and discomfort  Take frequent breaks and rest with your legs elevated if you have leg cramps or low back pain.  Take warm sitz baths to soothe any pain or discomfort caused by hemorrhoids. Use hemorrhoid cream if your health care provider approves.  Wear a good support bra to prevent discomfort from breast tenderness.  If you develop varicose veins: ? Wear support pantyhose or compression stockings as told by your healthcare provider. ? Elevate your feet for 15 minutes, 3-4 times a day. Prenatal care  Write down your questions. Take them to your prenatal visits.  Keep all your prenatal visits as told by your health care provider. This is important. Safety  Wear your seat belt at  all times when driving.  Make a list of emergency phone numbers, including numbers for family, friends, the hospital, and police and fire departments. General instructions  Avoid cat litter boxes and soil used by cats. These carry germs that can cause birth defects in the baby. If you have a cat, ask someone to clean the litter box for you.  Do not travel far distances unless it is absolutely necessary and only with the approval of your health care provider.  Do not use hot tubs, steam rooms, or saunas.  Do not drink alcohol.  Do not use any products that contain nicotine or tobacco, such as cigarettes and e-cigarettes. If you need help quitting, ask your health care provider.  Do not use any medicinal herbs or unprescribed drugs. These chemicals affect the formation and growth of the baby.  Do not douche or use tampons or scented sanitary pads.  Do not cross your legs for long periods of time.  To prepare for the arrival of your baby: ? Take prenatal classes to understand, practice, and ask questions about labor and delivery. ? Make a trial run to the hospital. ? Visit the hospital and tour the maternity area. ? Arrange for maternity or paternity leave through employers. ? Arrange for family and friends to take care of pets while you are in the hospital. ? Purchase a rear-facing car seat and make sure you know how to install it in your car. ? Pack your hospital bag. ? Prepare the baby's nursery. Make sure to remove all pillows and stuffed animals from the baby's crib to prevent suffocation.  Visit your dentist if you have not gone during your pregnancy. Use a soft toothbrush to brush your teeth and be gentle when you floss. Contact a health care provider if:  You are unsure if you are in labor or if your water has broken.  You become dizzy.  You have mild pelvic cramps, pelvic pressure, or nagging pain in your abdominal area.  You have lower back pain.  You have persistent  nausea, vomiting, or diarrhea.  You have an unusual or bad smelling vaginal discharge.  You have pain when you urinate. Get help right away if:  Your water breaks before 37 weeks.  You have regular contractions less than 5 minutes apart before 37 weeks.  You have a fever.  You are leaking fluid from your vagina.  You have spotting or bleeding from your vagina.  You have severe abdominal pain or cramping.  You have rapid weight loss or weight gain.    You have shortness of breath with chest pain.  You notice sudden or extreme swelling of your face, hands, ankles, feet, or legs.  Your baby makes fewer than 10 movements in 2 hours.  You have severe headaches that do not go away when you take medicine.  You have vision changes. Summary  The third trimester is from week 28 through week 40, months 7 through 9. The third trimester is a time when the unborn baby (fetus) is growing rapidly.  During the third trimester, your discomfort may increase as you and your baby continue to gain weight. You may have abdominal, leg, and back pain, sleeping problems, and an increased need to urinate.  During the third trimester your breasts will keep growing and they will continue to become tender. A yellow fluid (colostrum) may leak from your breasts. This is the first milk you are producing for your baby.  False labor is a condition in which you feel small, irregular tightenings of the muscles in the womb (contractions) that eventually go away. These are called Braxton Hicks contractions. Contractions may last for hours, days, or even weeks before true labor sets in.  Signs of labor can include: abdominal cramps; regular contractions that start at 10 minutes apart and become stronger and more frequent with time; watery or bloody mucus discharge that comes from the vagina; increased pelvic pressure and dull back pain; and leaking of amniotic fluid. This information is not intended to replace advice  given to you by your health care provider. Make sure you discuss any questions you have with your health care provider. Document Released: 03/03/2001 Document Revised: 08/15/2015 Document Reviewed: 05/10/2012 Elsevier Interactive Patient Education  2017 [employer]  

## 2016-07-07 NOTE — Progress Notes (Signed)
   PRENATAL VISIT NOTE  Subjective:  [name] is a 31 y.o. [phone] at [redacted]w[redacted]d being seen today for ongoing prenatal care.  She is currently monitored for the following issues for this high-risk pregnancy and has Human immunodeficiency virus (HIV) disease (HCC); VENEREAL WART; ASTHMA; CHLAMYDIAL INFECTION, HX OF; Obesity affecting pregnancy; Supervision of high risk pregnancy, antepartum; and History of preterm delivery, currently pregnant on her problem list.  Patient reports occasional contractions and  .  Contractions: Not present. Vag. Bleeding: None.  Movement: Present. Denies leaking of fluid.   The following portions of the patient's history were reviewed and updated as appropriate: allergies, current medications, past family history, past medical history, past social history, past surgical history and problem list. Problem list updated.  Objective:   Vitals:   07/07/16 1057  BP: 117/83  Pulse: (!) 113  Weight: 213 lb (96.6 kg)    Fetal Status: Fetal Heart Rate (bpm): 144   Movement: Present     General:  Alert, oriented and cooperative. Patient is in no acute distress.  Skin: Skin is warm and dry. No rash noted.   Cardiovascular: Normal heart rate noted  Respiratory: Normal respiratory effort, no problems with respiration noted  Abdomen: Soft, gravid, appropriate for gestational age. Pain/Pressure: Present     Pelvic:  occasional contractions and pressure        Extremities: Normal range of motion.  Edema: None  Mental Status: Normal mood and affect. Normal behavior. Normal judgment and thought content.   Assessment and Plan:  Pregnancy: [id number] at [redacted]w[redacted]d  1. Supervision of high risk pregnancy, antepartum, third trimester  - Glucose Tolerance, 2 Hours w/1 Hour - CBC - RPR  2. Abnormal urinalysis  - Culture, OB Urine  3. Supervision of high risk pregnancy, antepartum   4. Human immunodeficiency virus (HIV) disease (HCC) Low viral load  5. History of preterm  delivery, currently pregnant 36.3 weeks  Preterm labor symptoms and general obstetric precautions including but not limited to vaginal bleeding, contractions, leaking of fluid and fetal movement were reviewed in detail with the patient. Please refer to After Visit Summary for other counseling recommendations.  Return in about 2 weeks (around 07/21/2016).   Adam Phenix, MD

## 2016-07-08 ENCOUNTER — Telehealth: Payer: Self-pay | Admitting: *Deleted

## 2016-07-08 DIAGNOSIS — B379 Candidiasis, unspecified: Secondary | ICD-10-CM

## 2016-07-08 LAB — CBC
HEMATOCRIT: 32.1 % — AB (ref 34.0–46.6)
HEMOGLOBIN: 10.3 g/dL — AB (ref 11.1–15.9)
MCH: 29.3 pg (ref 26.6–33.0)
MCHC: 32.1 g/dL (ref 31.5–35.7)
MCV: 91 fL (ref 79–97)
Platelets: 249 10*3/uL (ref 150–379)
RBC: 3.52 x10E6/uL — ABNORMAL LOW (ref 3.77–5.28)
RDW: 13.9 % (ref 12.3–15.4)
WBC: 10.2 10*3/uL (ref 3.4–10.8)

## 2016-07-08 LAB — GLUCOSE TOLERANCE, 2 HOURS W/ 1HR
Glucose, 1 hour: 149 mg/dL (ref 65–179)
Glucose, 2 hour: 142 mg/dL (ref 65–152)
Glucose, Fasting: 85 mg/dL (ref 65–91)

## 2016-07-08 LAB — RPR: RPR Ser Ql: NONREACTIVE

## 2016-07-08 MED ORDER — TERCONAZOLE 0.4 % VA CREA: 1.0000 | TOPICAL_CREAM | Freq: Every day | VAGINAL | 0 refills | Status: AC

## 2016-07-08 NOTE — Telephone Encounter (Signed)
Patient called front desk and was transferred to me. She is upset because she was here yesterday for an appointment and had complained of vaginal itching and discharge, however the provider did not check her. Complains of itching, white clumpy discharge and swelling of vulva. Terconazole cream sent to her pharmacy per protocol. Advised patient that if she isn't feeling better in 2-3 days please call back and let us know. Understanding voiced.

## 2016-07-10 LAB — CULTURE, OB URINE

## 2016-07-10 LAB — URINE CULTURE, OB REFLEX

## 2016-07-24 ENCOUNTER — Encounter: Payer: Self-pay | Admitting: Obstetrics & Gynecology

## 2016-07-24 ENCOUNTER — Ambulatory Visit (INDEPENDENT_AMBULATORY_CARE_PROVIDER_SITE_OTHER): Payer: Medicaid Other | Admitting: Obstetrics & Gynecology

## 2016-07-24 VITALS — BP 113/73 | HR 124 | Wt 213.9 lb

## 2016-07-24 DIAGNOSIS — O09219 Supervision of pregnancy with history of pre-term labor, unspecified trimester: Secondary | ICD-10-CM

## 2016-07-24 DIAGNOSIS — B20 Human immunodeficiency virus [HIV] disease: Secondary | ICD-10-CM | POA: Diagnosis not present

## 2016-07-24 DIAGNOSIS — O0990 Supervision of high risk pregnancy, unspecified, unspecified trimester: Secondary | ICD-10-CM

## 2016-07-24 DIAGNOSIS — O0993 Supervision of high risk pregnancy, unspecified, third trimester: Secondary | ICD-10-CM | POA: Diagnosis not present

## 2016-07-24 DIAGNOSIS — O09899 Supervision of other high risk pregnancies, unspecified trimester: Secondary | ICD-10-CM

## 2016-07-24 NOTE — Patient Instructions (Signed)
Third Trimester of Pregnancy The third trimester is from week 28 through week 40 (months 7 through 9). The third trimester is a time when the unborn baby (fetus) is growing rapidly. At the end of the ninth month, the fetus is about 20 inches in length and weighs 6-10 pounds. Body changes during your third trimester Your body will continue to go through many changes during pregnancy. The changes vary from woman to woman. During the third trimester:  Your weight will continue to increase. You can expect to gain 25-35 pounds (11-16 kg) by the end of the pregnancy.  You may begin to get stretch marks on your hips, abdomen, and breasts.  You may urinate more often because the fetus is moving lower into your pelvis and pressing on your bladder.  You may develop or continue to have heartburn. This is caused by increased hormones that slow down muscles in the digestive tract.  You may develop or continue to have constipation because increased hormones slow digestion and cause the muscles that push waste through your intestines to relax.  You may develop hemorrhoids. These are swollen veins (varicose veins) in the rectum that can itch or be painful.  You may develop swollen, bulging veins (varicose veins) in your legs.  You may have increased body aches in the pelvis, back, or thighs. This is due to weight gain and increased hormones that are relaxing your joints.  You may have changes in your hair. These can include thickening of your hair, rapid growth, and changes in texture. Some women also have hair loss during or after pregnancy, or hair that feels dry or thin. Your hair will most likely return to normal after your baby is born.  Your breasts will continue to grow and they will continue to become tender. A yellow fluid (colostrum) may leak from your breasts. This is the first milk you are producing for your baby.  Your belly button may stick out.  You may notice more swelling in your hands,  face, or ankles.  You may have increased tingling or numbness in your hands, arms, and legs. The skin on your belly may also feel numb.  You may feel short of breath because of your expanding uterus.  You may have more problems sleeping. This can be caused by the size of your belly, increased need to urinate, and an increase in your body's metabolism.  You may notice the fetus "dropping," or moving lower in your abdomen (lightening).  You may have increased vaginal discharge.  You may notice your joints feel loose and you may have pain around your pelvic bone.  What to expect at prenatal visits You will have prenatal exams every 2 weeks until week 36. Then you will have weekly prenatal exams. During a routine prenatal visit:  You will be weighed to make sure you and the baby are growing normally.  Your blood pressure will be taken.  Your abdomen will be measured to track your baby's growth.  The fetal heartbeat will be listened to.  Any test results from the previous visit will be discussed.  You may have a cervical check near your due date to see if your cervix has softened or thinned (effaced).  You will be tested for Group B streptococcus. This happens between 35 and 37 weeks.  Your health care provider may ask you:  What your birth plan is.  How you are feeling.  If you are feeling the baby move.  If you have had   any abnormal symptoms, such as leaking fluid, bleeding, severe headaches, or abdominal cramping.  If you are using any tobacco products, including cigarettes, chewing tobacco, and electronic cigarettes.  If you have any questions.  Other tests or screenings that may be performed during your third trimester include:  Blood tests that check for low iron levels (anemia).  Fetal testing to check the health, activity level, and growth of the fetus. Testing is done if you have certain medical conditions or if there are problems during the  pregnancy.  Nonstress test (NST). This test checks the health of your baby to make sure there are no signs of problems, such as the baby not getting enough oxygen. During this test, a belt is placed around your belly. The baby is made to move, and its heart rate is monitored during movement.  What is false labor? False labor is a condition in which you feel small, irregular tightenings of the muscles in the womb (contractions) that usually go away with rest, changing position, or drinking water. These are called Braxton Hicks contractions. Contractions may last for hours, days, or even weeks before true labor sets in. If contractions come at regular intervals, become more frequent, increase in intensity, or become painful, you should see your health care provider. What are the signs of labor?  Abdominal cramps.  Regular contractions that start at 10 minutes apart and become stronger and more frequent with time.  Contractions that start on the top of the uterus and spread down to the lower abdomen and back.  Increased pelvic pressure and dull back pain.  A watery or bloody mucus discharge that comes from the vagina.  Leaking of amniotic fluid. This is also known as your "water breaking." It could be a slow trickle or a gush. Let your health care provider know if it has a color or strange odor. If you have any of these signs, call your health care provider right away, even if it is before your due date. Follow these instructions at home: Medicines  Follow your health care provider's instructions regarding medicine use. Specific medicines may be either safe or unsafe to take during pregnancy.  Take a prenatal vitamin that contains at least 600 micrograms (mcg) of folic acid.  If you develop constipation, try taking a stool softener if your health care provider approves. Eating and drinking  Eat a balanced diet that includes fresh fruits and vegetables, whole grains, good sources of protein  such as meat, eggs, or tofu, and low-fat dairy. Your health care provider will help you determine the amount of weight gain that is right for you.  Avoid raw meat and uncooked cheese. These carry germs that can cause birth defects in the baby.  If you have low calcium intake from food, talk to your health care provider about whether you should take a daily calcium supplement.  Eat four or five small meals rather than three large meals a day.  Limit foods that are high in fat and processed sugars, such as fried and sweet foods.  To prevent constipation: ? Drink enough fluid to keep your urine clear or pale yellow. ? Eat foods that are high in fiber, such as fresh fruits and vegetables, whole grains, and beans. Activity  Exercise only as directed by your health care provider. Most women can continue their usual exercise routine during pregnancy. Try to exercise for 30 minutes at least 5 days a week. Stop exercising if you experience uterine contractions.  Avoid heavy   lifting.  Do not exercise in extreme heat or humidity, or at high altitudes.  Wear low-heel, comfortable shoes.  Practice good posture.  You may continue to have sex unless your health care provider tells you otherwise. Relieving pain and discomfort  Take frequent breaks and rest with your legs elevated if you have leg cramps or low back pain.  Take warm sitz baths to soothe any pain or discomfort caused by hemorrhoids. Use hemorrhoid cream if your health care provider approves.  Wear a good support bra to prevent discomfort from breast tenderness.  If you develop varicose veins: ? Wear support pantyhose or compression stockings as told by your healthcare provider. ? Elevate your feet for 15 minutes, 3-4 times a day. Prenatal care  Write down your questions. Take them to your prenatal visits.  Keep all your prenatal visits as told by your health care provider. This is important. Safety  Wear your seat belt at  all times when driving.  Make a list of emergency phone numbers, including numbers for family, friends, the hospital, and police and fire departments. General instructions  Avoid cat litter boxes and soil used by cats. These carry germs that can cause birth defects in the baby. If you have a cat, ask someone to clean the litter box for you.  Do not travel far distances unless it is absolutely necessary and only with the approval of your health care provider.  Do not use hot tubs, steam rooms, or saunas.  Do not drink alcohol.  Do not use any products that contain nicotine or tobacco, such as cigarettes and e-cigarettes. If you need help quitting, ask your health care provider.  Do not use any medicinal herbs or unprescribed drugs. These chemicals affect the formation and growth of the baby.  Do not douche or use tampons or scented sanitary pads.  Do not cross your legs for long periods of time.  To prepare for the arrival of your baby: ? Take prenatal classes to understand, practice, and ask questions about labor and delivery. ? Make a trial run to the hospital. ? Visit the hospital and tour the maternity area. ? Arrange for maternity or paternity leave through employers. ? Arrange for family and friends to take care of pets while you are in the hospital. ? Purchase a rear-facing car seat and make sure you know how to install it in your car. ? Pack your hospital bag. ? Prepare the baby's nursery. Make sure to remove all pillows and stuffed animals from the baby's crib to prevent suffocation.  Visit your dentist if you have not gone during your pregnancy. Use a soft toothbrush to brush your teeth and be gentle when you floss. Contact a health care provider if:  You are unsure if you are in labor or if your water has broken.  You become dizzy.  You have mild pelvic cramps, pelvic pressure, or nagging pain in your abdominal area.  You have lower back pain.  You have persistent  nausea, vomiting, or diarrhea.  You have an unusual or bad smelling vaginal discharge.  You have pain when you urinate. Get help right away if:  Your water breaks before 37 weeks.  You have regular contractions less than 5 minutes apart before 37 weeks.  You have a fever.  You are leaking fluid from your vagina.  You have spotting or bleeding from your vagina.  You have severe abdominal pain or cramping.  You have rapid weight loss or weight gain.    You have shortness of breath with chest pain.  You notice sudden or extreme swelling of your face, hands, ankles, feet, or legs.  Your baby makes fewer than 10 movements in 2 hours.  You have severe headaches that do not go away when you take medicine.  You have vision changes. Summary  The third trimester is from week 28 through week 40, months 7 through 9. The third trimester is a time when the unborn baby (fetus) is growing rapidly.  During the third trimester, your discomfort may increase as you and your baby continue to gain weight. You may have abdominal, leg, and back pain, sleeping problems, and an increased need to urinate.  During the third trimester your breasts will keep growing and they will continue to become tender. A yellow fluid (colostrum) may leak from your breasts. This is the first milk you are producing for your baby.  False labor is a condition in which you feel small, irregular tightenings of the muscles in the womb (contractions) that eventually go away. These are called Braxton Hicks contractions. Contractions may last for hours, days, or even weeks before true labor sets in.  Signs of labor can include: abdominal cramps; regular contractions that start at 10 minutes apart and become stronger and more frequent with time; watery or bloody mucus discharge that comes from the vagina; increased pelvic pressure and dull back pain; and leaking of amniotic fluid. This information is not intended to replace advice  given to you by your health care provider. Make sure you discuss any questions you have with your health care provider. Document Released: 03/03/2001 Document Revised: 08/15/2015 Document Reviewed: 05/10/2012 Elsevier Interactive Patient Education  2017 [employer]  

## 2016-07-24 NOTE — Progress Notes (Signed)
Needs hemorrhoid treatment   PRENATAL VISIT NOTE  Subjective:  [name] is a 31 y.o. [phone] at 7436w3d being seen today for ongoing prenatal care.  She is currently monitored for the following issues for this high-risk pregnancy and has Human immunodeficiency virus (HIV) disease (HCC); ASTHMA; CHLAMYDIAL INFECTION, HX OF; Obesity affecting pregnancy; Supervision of high risk pregnancy, antepartum; and History of preterm delivery, currently pregnant on her problem list.  Patient reports hemorrhoid sx.  Contractions: Irritability. Vag. Bleeding: None.  Movement: Present. Denies leaking of fluid.   The following portions of the patient's history were reviewed and updated as appropriate: allergies, current medications, past family history, past medical history, past social history, past surgical history and problem list. Problem list updated.  Objective:   Vitals:   07/24/16 0854  BP: 113/73  Pulse: (!) 124  Weight: 213 lb 14.4 oz (97 kg)    Fetal Status: Fetal Heart Rate (bpm): 144   Movement: Present     General:  Alert, oriented and cooperative. Patient is in no acute distress.  Skin: Skin is warm and dry. No rash noted.   Cardiovascular: Normal heart rate noted  Respiratory: Normal respiratory effort, no problems with respiration noted  Abdomen: Soft, gravid, appropriate for gestational age. Pain/Pressure: Present     Pelvic:  Preterm        Extremities: Normal range of motion.  Edema: None  Mental Status: Normal mood and affect. Normal behavior. Normal judgment and thought content.   Assessment and Plan:  Pregnancy: [id number] at 4336w3d  1. Supervision of high risk pregnancy, antepartum HIV and h/o PTB  2. Human immunodeficiency virus (HIV) disease (HCC) Id f/u  3. History of preterm delivery, currently pregnant 17 P  Preterm labor symptoms and general obstetric precautions including but not limited to vaginal bleeding, contractions, leaking of fluid and fetal movement  were reviewed in detail with the patient. Please refer to After Visit Summary for other counseling recommendations.  Return in about 2 weeks (around 08/07/2016).   Adam PhenixJames G Harveer Sadler, MD

## 2016-07-27 MED ORDER — HYDROCORTISONE ACETATE 25 MG RE SUPP: 25.0000 mg | Freq: Two times a day (BID) | RECTAL | 1 refills | Status: DC

## 2016-07-29 ENCOUNTER — Encounter (HOSPITAL_COMMUNITY): Payer: Self-pay

## 2016-07-29 ENCOUNTER — Inpatient Hospital Stay (HOSPITAL_COMMUNITY)
Admission: AD | Admit: 2016-07-29 | Discharge: 2016-07-29 | Disposition: A | Discharge status: Home / Self Care | Payer: Medicaid Other | Source: Ambulatory Visit | Attending: Family Medicine | Admitting: Family Medicine

## 2016-07-29 DIAGNOSIS — O99513 Diseases of the respiratory system complicating pregnancy, third trimester: Secondary | ICD-10-CM | POA: Diagnosis not present

## 2016-07-29 DIAGNOSIS — Z21 Asymptomatic human immunodeficiency virus [HIV] infection status: Secondary | ICD-10-CM | POA: Insufficient documentation

## 2016-07-29 DIAGNOSIS — O2243 Hemorrhoids in pregnancy, third trimester: Secondary | ICD-10-CM | POA: Insufficient documentation

## 2016-07-29 DIAGNOSIS — Z79899 Other long term (current) drug therapy: Secondary | ICD-10-CM | POA: Insufficient documentation

## 2016-07-29 DIAGNOSIS — F329 Major depressive disorder, single episode, unspecified: Secondary | ICD-10-CM | POA: Insufficient documentation

## 2016-07-29 DIAGNOSIS — Z87891 Personal history of nicotine dependence: Secondary | ICD-10-CM | POA: Insufficient documentation

## 2016-07-29 DIAGNOSIS — O98713 Human immunodeficiency virus [HIV] disease complicating pregnancy, third trimester: Secondary | ICD-10-CM | POA: Diagnosis not present

## 2016-07-29 DIAGNOSIS — Z3A33 33 weeks gestation of pregnancy: Secondary | ICD-10-CM | POA: Diagnosis not present

## 2016-07-29 DIAGNOSIS — O9989 Other specified diseases and conditions complicating pregnancy, childbirth and the puerperium: Secondary | ICD-10-CM

## 2016-07-29 DIAGNOSIS — K649 Unspecified hemorrhoids: Secondary | ICD-10-CM

## 2016-07-29 DIAGNOSIS — O99343 Other mental disorders complicating pregnancy, third trimester: Secondary | ICD-10-CM | POA: Diagnosis not present

## 2016-07-29 DIAGNOSIS — J45909 Unspecified asthma, uncomplicated: Secondary | ICD-10-CM | POA: Insufficient documentation

## 2016-07-29 MED ORDER — HYDROCORTISONE ACE-PRAMOXINE 1-1 % RE FOAM
1.0000 | Freq: Once | RECTAL | Status: AC
Start: 2016-07-29 — End: 2016-07-29
  Administered 2016-07-29: 1 via RECTAL
  Filled 2016-07-29: qty 10

## 2016-07-29 MED ORDER — TUCKS 50 % EX PADS: 1.0000 "application " | MEDICATED_PAD | CUTANEOUS | 2 refills | Status: DC | PRN

## 2016-07-29 NOTE — MAU Provider Note (Signed)
Chief Complaint:  Hemorrhoids   First Provider Initiated Contact with Patient 07/29/16 0146     HPI: [name] is a 31 y.o. [id number] at 7233w1dwho presents to maternity admissions reporting hemorrhoid pain.  Did not fill Rx for cream due to money issues and lack of car.  Wants med for it now.. She reports good fetal movement, denies LOF, vaginal bleeding, vaginal itching/burning, urinary symptoms, h/a, dizziness, n/v, diarrhea, constipation or fever/chills.    Other  This is a recurrent problem. The current episode started 1 to 4 weeks ago. The problem occurs constantly. The problem has been unchanged. Pertinent negatives include no abdominal pain, change in bowel habit, chills, fever, myalgias, nausea, urinary symptoms or vomiting. The symptoms are aggravated by coughing. She has tried nothing for the symptoms.    RN note: Pt states she has hemorrhoids and was given prescription for them. Pt states she was unable to fill the prescription due to cost. States they have gotten much more painful and large since last appointment.    Electronically signed by Brand MalesSimpson, Danielle L, RN at 07/29/2016 12:52 AM     Past Medical History: Past Medical History:  Diagnosis Date  . Asthma   . Depression    Postpartum after 1st pregnancy  . Hemorrhoids   . HIV positive (HCC)     Past obstetric history: OB History  Gravida Para Term Preterm AB Living  6 4 3 1 1 3   SAB TAB Ectopic Multiple Live Births  0 0 1 0 3    # Outcome Date GA Lbr Len/2nd Weight Sex Delivery Anes PTL Lv  6 Current           5 Term 11/29/11 6645w0d 07:10 / 00:05 7 lb 9.9 oz (3.455 kg) F Vag-Spont EPI  LIV     Birth Comments: 7-pound 10-ounce infant born at 1739 weeks gestational age to a 30 year old gravida 5 para 3-1-1-3 female.  One sibling died at 65five months of age due to pneumococcal meningitis.  This was thought to be unrelated to mother's problem with AIDS.   Mother had depression.  She quit using tobacco in January 2013.   She was O positive, rubella immune, RPR nonreactive, hepatitis surface antigen negative, HIV reactive and Group B Strep negative.   There was meconium noted at visceral rupture of membranes one hour prior to delivery.   She had a normal spontaneous vaginal delivery with APGAR scores of 9 and 9, which is not credible.  Her length was 20 inches and head circumference 13 inches.  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 She passed her congenital heart screening and had normal screen for inborn errors of metabolism, had a negative HIV-1 RNA.  She passed her hearing screen.  Her peak bilirubin was 7.4. Growth and development has been normal.  4 Term 12/27/09   7 lb 11 oz (3.487 kg) M Vag-Spont   DEC  3 Preterm 06/28/07 154w3d  5 lb 15 oz (2.693 kg) F Vag-Spont EPI  LIV  2 Term 03/13/05 6345w0d  6 lb 10 oz (3.005 kg) M Vag-Spont        Complications: Hypertension in pregnancy, preeclampsia, severe, delivered/postpartum  1 Ectopic  1126w0d       DEC     Birth Comments: D&C       Past Surgical History: Past Surgical History:  Procedure Laterality Date  . DILATION AND CURETTAGE OF UTERUS    . LAPAROSCOPY  01/06/2011   Procedure:  LAPAROSCOPY OPERATIVE;  Surgeon: Roseanna Rainbow, MD;  Location: [facility];  Service: Gynecology;  Laterality: N/A;  . LAPAROTOMY  01/06/2011   Procedure: LAPAROTOMY;  Surgeon: Roseanna Rainbow, MD;  Location: [facility];  Service: Gynecology;  Laterality: N/A;  . SALPINGOOPHORECTOMY      Family History: Family History  Problem Relation Age of Onset  . Diabetes Mother   . Hypertension Mother   . Hyperlipidemia Mother   . Kidney disease Mother   . Diabetes Brother   . Diabetes Maternal Aunt   . Hypertension Maternal Aunt   . Asthma Daughter     Social History: Social History  Substance Use Topics  . Smoking status: Former Smoker    Packs/day: 0.50    Years: 7.00    Types: Cigarettes    Quit date: 03/29/2011  . Smokeless tobacco: Never Used   . Alcohol use No    Allergies:  Allergies  Allergen Reactions  . Benadryl [Diphenhydramine]     Meds:  Prescriptions Prior to Admission  Medication Sig Dispense Refill Last Dose  . abacavir-lamiVUDine (EPZICOM) 600-300 MG tablet Take 1 tablet by mouth daily.   Taking  . darunavir (PREZISTA) 600 MG tablet Take 600 mg by mouth 2 (two) times daily with a meal.   Taking  . docusate sodium (COLACE) 100 MG capsule Take 1 capsule (100 mg total) by mouth 2 (two) times daily as needed. (Patient not taking: Reported on 07/24/2016) 30 capsule 2 Not Taking  . ferrous sulfate 325 (65 FE) MG tablet Take 1 tablet (325 mg total) by mouth daily. 30 tablet 3 Taking  . hydrocortisone (ANUSOL-HC) 25 MG suppository Place 1 suppository (25 mg total) rectally 2 (two) times daily. 12 suppository 1   . Prenatal Vit-Fe Fumarate-FA (PREPLUS) 27-1 MG TABS Take 1 tablet by mouth daily. 30 tablet 13 Taking  . ritonavir (NORVIR) 100 MG TABS tablet Take 100 mg by mouth 2 (two) times daily.   Taking    I have reviewed patient's Past Medical Hx, Surgical Hx, Family Hx, Social Hx, medications and allergies.   ROS:  Review of Systems  Constitutional: Negative for chills and fever.  Gastrointestinal: Positive for rectal pain. Negative for abdominal pain, anal bleeding, change in bowel habit, constipation, diarrhea, nausea and vomiting.  Genitourinary: Negative for dysuria, pelvic pain and vaginal bleeding.  Musculoskeletal: Negative for myalgias.   Other systems negative  Physical Exam  Patient Vitals for the past 24 hrs:  BP Temp Temp src Pulse Resp SpO2 Height Weight  07/29/16 0053 125/79 98.2 F (36.8 C) Oral (!) 112 16 99 % 5' 3.5" (1.613 m) 216 lb (98 kg)   Constitutional: Well-developed, well-nourished female in no acute distress.  Cardiovascular: normal rate and rhythm Respiratory: normal effort, clear to auscultation bilaterally GI: Abd soft, non-tender, gravid appropriate for gestational age.   No  rebound or guarding. There is a single external hemorrhoid noted.  No thrombosis noted  MS: Extremities nontender, no edema, normal ROM Neurologic: Alert and oriented x 4.  GU: Neg CVAT.  PELVIC EXAM: deferred    FHT:  Baseline 140 , moderate variability, accelerations present, no decelerations Contractions: Rare   Labs: No results found for this or any previous visit (from the past 24 hour(s)). O/POS/-- (01/10 1152)  Imaging:  No results found.  MAU Course/MDM: I have ordered no labs. NST reviewed  Treatments in MAU included Proctofoam HC.    Assessment: Single IUP at [redacted]w[redacted]d Hemorrhoid pain  Plan: Discharge home  Has Rx for hemorrhoid cream Also discussed other care Rx Tucks pads prn use Preterm Labor precautions and fetal kick counts Follow up in Office for prenatal visits and recheck of status  Encouraged to return here or to other Urgent Care/ED if she develops worsening of symptoms, increase in pain, fever, or other concerning symptoms.   Pt stable at time of discharge.  Wynelle Bourgeois CNM, MSN Certified Nurse-Midwife 07/29/2016 1:46 AM

## 2016-07-29 NOTE — Discharge Instructions (Signed)
Hemorrhoids Hemorrhoids are swollen veins in and around the rectum or anus. There are two types of hemorrhoids:  Internal hemorrhoids. These occur in the veins that are just inside the rectum. They may poke through to the outside and become irritated and painful.  External hemorrhoids. These occur in the veins that are outside of the anus and can be felt as a painful swelling or hard lump near the anus.  Most hemorrhoids do not cause serious problems, and they can be managed with home treatments such as diet and lifestyle changes. If home treatments do not help your symptoms, procedures can be done to shrink or remove the hemorrhoids. What are the causes? This condition is caused by increased pressure in the anal area. This pressure may result from various things, including:  Constipation.  Straining to have a bowel movement.  Diarrhea.  Pregnancy.  Obesity.  Sitting for long periods of time.  Heavy lifting or other activity that causes you to strain.  Anal sex.  What are the signs or symptoms? Symptoms of this condition include:  Pain.  Anal itching or irritation.  Rectal bleeding.  Leakage of stool (feces).  Anal swelling.  One or more lumps around the anus.  How is this diagnosed? This condition can often be diagnosed through a visual exam. Other exams or tests may also be done, such as:  Examination of the rectal area with a gloved hand (digital rectal exam).  Examination of the anal canal using a small tube (anoscope).  A blood test, if you have lost a significant amount of blood.  A test to look inside the colon (sigmoidoscopy or colonoscopy).  How is this treated? This condition can usually be treated at home. However, various procedures may be done if dietary changes, lifestyle changes, and other home treatments do not help your symptoms. These procedures can help make the hemorrhoids smaller or remove them completely. Some of these procedures involve  surgery, and others do not. Common procedures include:  Rubber band ligation. Rubber bands are placed at the base of the hemorrhoids to cut off the blood supply to them.  Sclerotherapy. Medicine is injected into the hemorrhoids to shrink them.  Infrared coagulation. A type of light energy is used to get rid of the hemorrhoids.  Hemorrhoidectomy surgery. The hemorrhoids are surgically removed, and the veins that supply them are tied off.  Stapled hemorrhoidopexy surgery. A circular stapling device is used to remove the hemorrhoids and use staples to cut off the blood supply to them.  Follow these instructions at home: Eating and drinking  Eat foods that have a lot of fiber in them, such as whole grains, beans, nuts, fruits, and vegetables. Ask your health care provider about taking products that have added fiber (fiber supplements).  Drink enough fluid to keep your urine clear or pale yellow. Managing pain and swelling  Take warm sitz baths for 20 minutes, 3-4 times a day to ease pain and discomfort.  If directed, apply ice to the affected area. Using ice packs between sitz baths may be helpful. ? Put ice in a plastic bag. ? Place a towel between your skin and the bag. ? Leave the ice on for 20 minutes, 2-3 times a day. General instructions  Take over-the-counter and prescription medicines only as told by your health care provider.  Use medicated creams or suppositories as told.  Exercise regularly.  Go to the bathroom when you have the urge to have a bowel movement. Do not wait.    Avoid straining to have bowel movements.  Keep the anal area dry and clean. Use wet toilet paper or moist towelettes after a bowel movement.  Do not sit on the toilet for long periods of time. This increases blood pooling and pain. Contact a health care provider if:  You have increasing pain and swelling that are not controlled by treatment or medicine.  You have uncontrolled bleeding.  You  have difficulty having a bowel movement, or you are unable to have a bowel movement.  You have pain or inflammation outside the area of the hemorrhoids. This information is not intended to replace advice given to you by your health care provider. Make sure you discuss any questions you have with your health care provider. Document Released: 03/06/2000 Document Revised: 08/07/2015 Document Reviewed: 11/21/2014 Elsevier Interactive Patient Education  2017 [employer]  

## 2016-07-29 NOTE — MAU Note (Signed)
Pt states she has hemorrhoids and was given prescription for them. Pt states she was unable to fill the prescription due to cost. States they have gotten much more painful and large since last appointment.

## 2016-08-03 ENCOUNTER — Ambulatory Visit (HOSPITAL_COMMUNITY)
Admission: RE | Admit: 2016-08-03 | Discharge: 2016-08-03 | Disposition: A | Discharge status: Home / Self Care | Payer: Medicaid Other | Source: Ambulatory Visit | Attending: Family Medicine | Admitting: Family Medicine

## 2016-08-03 ENCOUNTER — Encounter (HOSPITAL_COMMUNITY): Payer: Self-pay

## 2016-08-03 ENCOUNTER — Other Ambulatory Visit (HOSPITAL_COMMUNITY): Payer: Self-pay | Admitting: Maternal and Fetal Medicine

## 2016-08-03 DIAGNOSIS — Z3A34 34 weeks gestation of pregnancy: Secondary | ICD-10-CM | POA: Diagnosis not present

## 2016-08-03 DIAGNOSIS — O99333 Smoking (tobacco) complicating pregnancy, third trimester: Secondary | ICD-10-CM

## 2016-08-03 DIAGNOSIS — Z362 Encounter for other antenatal screening follow-up: Secondary | ICD-10-CM | POA: Diagnosis not present

## 2016-08-03 DIAGNOSIS — J45909 Unspecified asthma, uncomplicated: Secondary | ICD-10-CM | POA: Insufficient documentation

## 2016-08-03 DIAGNOSIS — O09219 Supervision of pregnancy with history of pre-term labor, unspecified trimester: Secondary | ICD-10-CM

## 2016-08-03 DIAGNOSIS — O9989 Other specified diseases and conditions complicating pregnancy, childbirth and the puerperium: Secondary | ICD-10-CM | POA: Diagnosis not present

## 2016-08-03 DIAGNOSIS — O98713 Human immunodeficiency virus [HIV] disease complicating pregnancy, third trimester: Secondary | ICD-10-CM | POA: Diagnosis not present

## 2016-08-03 DIAGNOSIS — O0990 Supervision of high risk pregnancy, unspecified, unspecified trimester: Secondary | ICD-10-CM

## 2016-08-03 DIAGNOSIS — O09213 Supervision of pregnancy with history of pre-term labor, third trimester: Secondary | ICD-10-CM | POA: Insufficient documentation

## 2016-08-03 NOTE — Addendum Note (Signed)
Encounter addended by: Tommie Raymondester, Emmalie Haigh T on: 08/03/2016 10:01 AM<BR>    Actions taken: Imaging Exam ended

## 2016-08-06 ENCOUNTER — Encounter: Payer: Medicaid Other | Admitting: Family Medicine

## 2016-08-07 ENCOUNTER — Encounter (HOSPITAL_COMMUNITY): Payer: Self-pay | Admitting: *Deleted

## 2016-08-07 ENCOUNTER — Inpatient Hospital Stay (HOSPITAL_COMMUNITY)
Admission: AD | Admit: 2016-08-07 | Discharge: 2016-08-07 | Disposition: A | Discharge status: Home / Self Care | Payer: Medicaid Other | Source: Ambulatory Visit | Attending: Obstetrics & Gynecology | Admitting: Obstetrics & Gynecology

## 2016-08-07 DIAGNOSIS — O26893 Other specified pregnancy related conditions, third trimester: Secondary | ICD-10-CM | POA: Insufficient documentation

## 2016-08-07 DIAGNOSIS — O09213 Supervision of pregnancy with history of pre-term labor, third trimester: Secondary | ICD-10-CM

## 2016-08-07 DIAGNOSIS — O0990 Supervision of high risk pregnancy, unspecified, unspecified trimester: Secondary | ICD-10-CM

## 2016-08-07 DIAGNOSIS — R109 Unspecified abdominal pain: Secondary | ICD-10-CM | POA: Diagnosis present

## 2016-08-07 DIAGNOSIS — Z9889 Other specified postprocedural states: Secondary | ICD-10-CM | POA: Diagnosis not present

## 2016-08-07 DIAGNOSIS — Z8249 Family history of ischemic heart disease and other diseases of the circulatory system: Secondary | ICD-10-CM | POA: Diagnosis not present

## 2016-08-07 DIAGNOSIS — Z3A34 34 weeks gestation of pregnancy: Secondary | ICD-10-CM | POA: Diagnosis not present

## 2016-08-07 DIAGNOSIS — F329 Major depressive disorder, single episode, unspecified: Secondary | ICD-10-CM | POA: Insufficient documentation

## 2016-08-07 DIAGNOSIS — O9989 Other specified diseases and conditions complicating pregnancy, childbirth and the puerperium: Secondary | ICD-10-CM | POA: Diagnosis not present

## 2016-08-07 DIAGNOSIS — F17210 Nicotine dependence, cigarettes, uncomplicated: Secondary | ICD-10-CM | POA: Insufficient documentation

## 2016-08-07 DIAGNOSIS — O98713 Human immunodeficiency virus [HIV] disease complicating pregnancy, third trimester: Secondary | ICD-10-CM | POA: Insufficient documentation

## 2016-08-07 DIAGNOSIS — O99343 Other mental disorders complicating pregnancy, third trimester: Secondary | ICD-10-CM | POA: Diagnosis not present

## 2016-08-07 DIAGNOSIS — O99333 Smoking (tobacco) complicating pregnancy, third trimester: Secondary | ICD-10-CM | POA: Diagnosis not present

## 2016-08-07 DIAGNOSIS — Z825 Family history of asthma and other chronic lower respiratory diseases: Secondary | ICD-10-CM | POA: Diagnosis not present

## 2016-08-07 DIAGNOSIS — Z833 Family history of diabetes mellitus: Secondary | ICD-10-CM | POA: Insufficient documentation

## 2016-08-07 DIAGNOSIS — Z888 Allergy status to other drugs, medicaments and biological substances status: Secondary | ICD-10-CM | POA: Insufficient documentation

## 2016-08-07 DIAGNOSIS — N859 Noninflammatory disorder of uterus, unspecified: Secondary | ICD-10-CM

## 2016-08-07 DIAGNOSIS — Z841 Family history of disorders of kidney and ureter: Secondary | ICD-10-CM | POA: Insufficient documentation

## 2016-08-07 DIAGNOSIS — O09893 Supervision of other high risk pregnancies, third trimester: Secondary | ICD-10-CM | POA: Insufficient documentation

## 2016-08-07 DIAGNOSIS — Z8759 Personal history of other complications of pregnancy, childbirth and the puerperium: Secondary | ICD-10-CM | POA: Diagnosis not present

## 2016-08-07 DIAGNOSIS — N858 Other specified noninflammatory disorders of uterus: Secondary | ICD-10-CM

## 2016-08-07 LAB — URINALYSIS, ROUTINE W REFLEX MICROSCOPIC
BILIRUBIN URINE: NEGATIVE
Glucose, UA: NEGATIVE mg/dL
HGB URINE DIPSTICK: NEGATIVE
KETONES UR: NEGATIVE mg/dL
Nitrite: NEGATIVE
Protein, ur: NEGATIVE mg/dL
Specific Gravity, Urine: <1.005 — ABNORMAL LOW (ref 1.005–1.030)
pH: 6 (ref 5.0–8.0)

## 2016-08-07 LAB — URINALYSIS, MICROSCOPIC (REFLEX)
Bacteria, UA: NONE SEEN
RBC / HPF: NONE SEEN RBC/hpf (ref 0–5)

## 2016-08-07 MED ORDER — NIFEDIPINE 10 MG PO CAPS
10.0000 mg | ORAL_CAPSULE | Freq: Once | ORAL | Status: DC
  Filled 2016-08-07: qty 1

## 2016-08-07 NOTE — MAU Note (Signed)
C/o ucs since 0400 this AM; denies SROM;

## 2016-08-07 NOTE — Discharge Instructions (Signed)
. °Preterm Labor and Birth Information °The normal length of a pregnancy is 39-41 weeks. Preterm labor is when labor starts before 37 completed weeks of pregnancy. °What are the risk factors for preterm labor? °Preterm labor is more likely to occur in women who: °· Have certain infections during pregnancy such as a bladder infection, sexually transmitted infection, or infection inside the uterus (chorioamnionitis). °· Have a shorter-than-normal cervix. °· Have gone into preterm labor before. °· Have had surgery on their cervix. °· Are younger than age 17 or older than age 35. °· Are African American. °· Are pregnant with twins or multiple babies (multiple gestation). °· Take street drugs or smoke while pregnant. °· Do not gain enough weight while pregnant. °· Became pregnant shortly after having been pregnant. °What are the symptoms of preterm labor? °Symptoms of preterm labor include: °· Cramps similar to those that can happen during a menstrual period. The cramps may happen with diarrhea. °· Pain in the abdomen or lower back. °· Regular uterine contractions that may feel like tightening of the abdomen. °· A feeling of increased pressure in the pelvis. °· Increased watery or bloody mucus discharge from the vagina. °· Water breaking (ruptured amniotic sac). °Why is it important to recognize signs of preterm labor? °It is important to recognize signs of preterm labor because babies who are born prematurely may not be fully developed. This can put them at an increased risk for: °· Long-term (chronic) heart and lung problems. °· Difficulty immediately after birth with regulating body systems, including blood sugar, body temperature, heart rate, and breathing rate. °· Bleeding in the brain. °· Cerebral palsy. °· Learning difficulties. °· Death. °These risks are highest for babies who are born before 34 weeks of pregnancy. °How is preterm labor treated? °Treatment depends on the length of your pregnancy, your condition,  and the health of your baby. It may involve: °· Having a stitch (suture) placed in your cervix to prevent your cervix from opening too early (cerclage). °· Taking or being given medicines, such as: °¨ Hormone medicines. These may be given early in pregnancy to help support the pregnancy. °¨ Medicine to stop contractions. °¨ Medicines to help mature the baby’s lungs. These may be prescribed if the risk of delivery is high. °¨ Medicines to prevent your baby from developing cerebral palsy. °If the labor happens before 34 weeks of pregnancy, you may need to stay in the hospital. °What should I do if I think I am in preterm labor? °If you think that you are going into preterm labor, call your health care provider right away. °How can I prevent preterm labor in future pregnancies? °To increase your chance of having a full-term pregnancy: °· Do not use any tobacco products, such as cigarettes, chewing tobacco, and e-cigarettes. If you need help quitting, ask your health care provider. °· Do not use street drugs or medicines that have not been prescribed to you during your pregnancy. °· Talk with your health care provider before taking any herbal supplements, even if you have been taking them regularly. °· Make sure you gain a healthy amount of weight during your pregnancy. °· Watch for infection. If you think that you might have an infection, get it checked right away. °· Make sure to tell your health care provider if you have gone into preterm labor before. °This information is not intended to replace advice given to you by your health care provider. Make sure you discuss any questions you have with your   health care provider. °Document Released: 05/30/2003 Document Revised: 08/20/2015 Document Reviewed: 07/31/2015 °Elsevier Interactive Patient Education © 2017 Elsevier Inc. ° °

## 2016-08-07 NOTE — MAU Provider Note (Signed)
Chief Complaint:  Contractions   First Provider Initiated Contact with Patient 08/07/16 [phone]     HPI  HPI: [name] is a 31 y.o. [id number] at 4534w3dwho presents to maternity admissions reporting uterine contractions since 4am.  States they are mildly painful.  Has been treated for this before. Has hx of preterm birth She reports good fetal movement, denies LOF, vaginal bleeding, vaginal itching/burning, urinary symptoms, h/a, dizziness, n/v, diarrhea, constipation or fever/chills.  She denies headache, visual changes or RUQ abdominal pain.  RN Note: C/o ucs since 0400 this AM; denies SROM;  Past Medical History: Past Medical History:  Diagnosis Date  . Asthma   . Depression    Postpartum after 1st pregnancy  . Hemorrhoids   . HIV positive (HCC)     Past obstetric history: OB History  Gravida Para Term Preterm AB Living  6 4 3 1 1 3   SAB TAB Ectopic Multiple Live Births  0 0 1 0 3    # Outcome Date GA Lbr Len/2nd Weight Sex Delivery Anes PTL Lv  6 Current           5 Term 11/29/11 5360w0d 07:10 / 00:05 7 lb 9.9 oz (3.455 kg) F Vag-Spont EPI  LIV     Birth Comments: 7-pound 10-ounce infant born at 8439 weeks gestational age to a 30 year old gravida 5 para 3-1-1-3 female.  One sibling died at 63five months of age due to pneumococcal meningitis.  This was thought to be unrelated to mother's problem with AIDS.   Mother had depression.  She quit using tobacco in January 2013.  She was O positive, rubella immune, RPR nonreactive, hepatitis surface antigen negative, HIV reactive and Group B Strep negative.   There was meconium noted at visceral rupture of membranes one hour prior to delivery.   She had a normal spontaneous vaginal delivery with APGAR scores of 9 and 9, which is not credible.  Her length was 20 inches and head circumference 13 inches.  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 She passed her congenital heart screening and had normal screen for inborn  errors of metabolism, had a negative HIV-1 RNA.  She passed her hearing screen.  Her peak bilirubin was 7.4. Growth and development has been normal.  4 Term 12/27/09   7 lb 11 oz (3.487 kg) M Vag-Spont   DEC  3 Preterm 06/28/07 2973w3d  5 lb 15 oz (2.693 kg) F Vag-Spont EPI  LIV  2 Term 03/13/05 4060w0d  6 lb 10 oz (3.005 kg) M Vag-Spont        Complications: Hypertension in pregnancy, preeclampsia, severe, delivered/postpartum  1 Ectopic  2438w0d       DEC     Birth Comments: D&C       Past Surgical History: Past Surgical History:  Procedure Laterality Date  . DILATION AND CURETTAGE OF UTERUS    . LAPAROSCOPY  01/06/2011   Procedure: LAPAROSCOPY OPERATIVE;  Surgeon: Roseanna RainbowLisa A Jackson-Moore, MD;  Location: [facility];  Service: Gynecology;  Laterality: N/A;  . LAPAROTOMY  01/06/2011   Procedure: LAPAROTOMY;  Surgeon: Roseanna RainbowLisa A Jackson-Moore, MD;  Location: [facility];  Service: Gynecology;  Laterality: N/A;  . SALPINGOOPHORECTOMY      Family History: Family History  Problem Relation Age of Onset  . Diabetes Mother   . Hypertension Mother   . Hyperlipidemia Mother   . Kidney disease Mother   . Diabetes Brother   . Diabetes Maternal Aunt   .  Hypertension Maternal Aunt   . Asthma Daughter     Social History: Social History  Substance Use Topics  . Smoking status: Current Every Day Smoker    Packs/day: 0.50    Years: 7.00    Types: Cigarettes    Last attempt to quit: 03/29/2011  . Smokeless tobacco: Current User  . Alcohol use No    Allergies:  Allergies  Allergen Reactions  . Benadryl [Diphenhydramine]     Meds:  Prescriptions Prior to Admission  Medication Sig Dispense Refill Last Dose  . abacavir-lamiVUDine (EPZICOM) 600-300 MG tablet Take 1 tablet by mouth daily.   Taking  . darunavir (PREZISTA) 600 MG tablet Take 600 mg by mouth 2 (two) times daily with a meal.   Taking  . docusate sodium (COLACE) 100 MG capsule Take 1 capsule (100 mg total) by mouth 2 (two) times daily as needed.  (Patient not taking: Reported on 07/24/2016) 30 capsule 2 Not Taking  . ferrous sulfate 325 (65 FE) MG tablet Take 1 tablet (325 mg total) by mouth daily. 30 tablet 3 Taking  . hydrocortisone (ANUSOL-HC) 25 MG suppository Place 1 suppository (25 mg total) rectally 2 (two) times daily. 12 suppository 1 Taking  . Prenatal Vit-Fe Fumarate-FA (PREPLUS) 27-1 MG TABS Take 1 tablet by mouth daily. 30 tablet 13 Taking  . ritonavir (NORVIR) 100 MG TABS tablet Take 100 mg by mouth 2 (two) times daily.   Taking  . Witch Hazel (TUCKS) 50 % PADS Apply 1 application topically every 2 (two) hours as needed. (Patient not taking: Reported on 08/03/2016) 1 each 2 Not Taking    I have reviewed patient's Past Medical Hx, Surgical Hx, Family Hx, Social Hx, medications and allergies.   ROS:  Review of Systems  Constitutional: Negative for chills and fever.  Respiratory: Negative for shortness of breath.   Gastrointestinal: Positive for abdominal pain. Negative for constipation, diarrhea, nausea and vomiting.  Genitourinary: Negative for dysuria and vaginal bleeding.   Other systems negative  Physical Exam  Patient Vitals for the past 24 hrs:  Weight  08/07/16 0850 216 lb 4 oz (98.1 kg)   Constitutional: Well-developed, well-nourished female in no acute distress.  Cardiovascular: normal rate and rhythm Respiratory: normal effort, clear to auscultation bilaterally GI: Abd soft, non-tender, gravid appropriate for gestational age.   No rebound or guarding. MS: Extremities nontender, no edema, normal ROM Neurologic: Alert and oriented x 4.  GU: Neg CVAT.  PELVIC EXAM:    Cervix closed and long.  Vertex ballotable   FHT:  Baseline 140 , moderate variability, accelerations present, no decelerations Contractions: Irregular Uterine irritability   Labs: Results for orders placed or performed during the hospital encounter of 08/07/16 (from the past 24 hour(s))  Urinalysis, Routine w reflex microscopic      Status: Abnormal   Collection Time: 08/07/16  8:40 AM  Result Value Ref Range   Color, Urine YELLOW YELLOW   APPearance CLEAR CLEAR   Specific Gravity, Urine <1.005 (L) 1.005 - 1.030   pH 6.0 5.0 - 8.0   Glucose, UA NEGATIVE NEGATIVE mg/dL   Hgb urine dipstick NEGATIVE NEGATIVE   Bilirubin Urine NEGATIVE NEGATIVE   Ketones, ur NEGATIVE NEGATIVE mg/dL   Protein, ur NEGATIVE NEGATIVE mg/dL   Nitrite NEGATIVE NEGATIVE   Leukocytes, UA TRACE (A) NEGATIVE  Urinalysis, Microscopic (reflex)     Status: Abnormal   Collection Time: 08/07/16  8:40 AM  Result Value Ref Range   RBC / HPF NONE SEEN  0 - 5 RBC/hpf   WBC, UA 0-5 0 - 5 WBC/hpf   Bacteria, UA NONE SEEN NONE SEEN   Squamous Epithelial / LPF 0-5 (A) NONE SEEN   O/POS/-- (01/10 1152)  Imaging:   MAU Course/MDM: I have ordered labs and reviewed results. Urine shows excellent hydration. NST reviewed and found to be reassuring and reactive with uterine irritability.   Treatments in MAU included Procardia ordered Patient refused Procardia.  Wants to "let the baby come if it wants to".  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 Patient agrees to accept consequences if baby born early.  .    Assessment: 1. Supervision of high risk pregnancy, antepartum   2.     Preterm uterine irritability with no change in cervix  Plan: Discharge home Preterm Labor precautions and fetal kick counts Follow up in Office for prenatal visits and recheck of cervix  Encouraged to return here or to other Urgent Care/ED if she develops worsening of symptoms, increase in pain, fever, or other concerning symptoms.   Pt stable at time of discharge.  Wynelle Bourgeois CNM, MSN Certified Nurse-Midwife 08/07/2016 9:08 AM

## 2016-08-07 NOTE — MAU Note (Signed)
Having abd pain, believes it is contractions.  Hx of PTD.  Denies bleeding or leaking.

## 2016-08-11 ENCOUNTER — Ambulatory Visit (INDEPENDENT_AMBULATORY_CARE_PROVIDER_SITE_OTHER): Payer: Medicaid Other | Admitting: Obstetrics and Gynecology

## 2016-08-11 VITALS — BP 127/88 | HR 105 | Wt 216.7 lb

## 2016-08-11 DIAGNOSIS — O09899 Supervision of other high risk pregnancies, unspecified trimester: Secondary | ICD-10-CM

## 2016-08-11 DIAGNOSIS — O09213 Supervision of pregnancy with history of pre-term labor, third trimester: Secondary | ICD-10-CM

## 2016-08-11 DIAGNOSIS — O0993 Supervision of high risk pregnancy, unspecified, third trimester: Secondary | ICD-10-CM

## 2016-08-11 DIAGNOSIS — Z21 Asymptomatic human immunodeficiency virus [HIV] infection status: Secondary | ICD-10-CM

## 2016-08-11 DIAGNOSIS — O09219 Supervision of pregnancy with history of pre-term labor, unspecified trimester: Principal | ICD-10-CM

## 2016-08-11 DIAGNOSIS — O0990 Supervision of high risk pregnancy, unspecified, unspecified trimester: Secondary | ICD-10-CM

## 2016-08-11 DIAGNOSIS — O98713 Human immunodeficiency virus [HIV] disease complicating pregnancy, third trimester: Secondary | ICD-10-CM

## 2016-08-11 DIAGNOSIS — B20 Human immunodeficiency virus [HIV] disease: Secondary | ICD-10-CM

## 2016-08-11 NOTE — Progress Notes (Signed)
   PRENATAL VISIT NOTE  Subjective:  [name] is a 31 y.o. [phone] at 6268w0d being seen today for ongoing prenatal care.  She is currently monitored for the following issues for this high-risk pregnancy and has Human immunodeficiency virus (HIV) disease (HCC); ASTHMA; CHLAMYDIAL INFECTION, HX OF; Obesity affecting pregnancy; Supervision of high risk pregnancy, antepartum; and History of preterm delivery, currently pregnant on her problem list.  Patient reports no complaints.  Contractions: Irritability. Vag. Bleeding: None.  Movement: Present. Denies leaking of fluid.   The following portions of the patient's history were reviewed and updated as appropriate: allergies, current medications, past family history, past medical history, past social history, past surgical history and problem list. Problem list updated.  Objective:   Vitals:   08/11/16 0956  BP: 127/88  Pulse: (!) 105  Weight: 216 lb 11.2 oz (98.3 kg)    Fetal Status: Fetal Heart Rate (bpm): 156   Movement: Present     General:  Alert, oriented and cooperative. Patient is in no acute distress.  Skin: Skin is warm and dry. No rash noted.   Cardiovascular: Normal heart rate noted  Respiratory: Normal respiratory effort, no problems with respiration noted  Abdomen: Soft, gravid, appropriate for gestational age. Pain/Pressure: Present     Pelvic:  Cervical exam deferred        Extremities: Normal range of motion.  Edema: None  Mental Status: Normal mood and affect. Normal behavior. Normal judgment and thought content.   Assessment and Plan:  Pregnancy: [id number] at 368w0d  1. History of preterm delivery, currently pregnant - presented to late to care for 17-p -nearly term now  2. Supervision of high risk pregnancy, antepartum -up to date on screenings  3. Human immunodeficiency virus (HIV) disease (HCC) -plan for indcution at 39 wks -last CD4 count 33 and HIV viral load <40 - in care everywhere -AZT in  labor.  Term labor symptoms and general obstetric precautions including but not limited to vaginal bleeding, contractions, leaking of fluid and fetal movement were reviewed in detail with the patient. Please refer to After Visit Summary for other counseling recommendations.  No Follow-up on file.   Ernestina PennaNicholas Aibhlinn Kalmar, MD

## 2016-08-11 NOTE — Patient Instructions (Signed)
Third Trimester of Pregnancy The third trimester is from week 28 through week 40 (months 7 through 9). The third trimester is a time when the unborn baby (fetus) is growing rapidly. At the end of the ninth month, the fetus is about 20 inches in length and weighs 6-10 pounds. Body changes during your third trimester Your body will continue to go through many changes during pregnancy. The changes vary from woman to woman. During the third trimester:  Your weight will continue to increase. You can expect to gain 25-35 pounds (11-16 kg) by the end of the pregnancy.  You may begin to get stretch marks on your hips, abdomen, and breasts.  You may urinate more often because the fetus is moving lower into your pelvis and pressing on your bladder.  You may develop or continue to have heartburn. This is caused by increased hormones that slow down muscles in the digestive tract.  You may develop or continue to have constipation because increased hormones slow digestion and cause the muscles that push waste through your intestines to relax.  You may develop hemorrhoids. These are swollen veins (varicose veins) in the rectum that can itch or be painful.  You may develop swollen, bulging veins (varicose veins) in your legs.  You may have increased body aches in the pelvis, back, or thighs. This is due to weight gain and increased hormones that are relaxing your joints.  You may have changes in your hair. These can include thickening of your hair, rapid growth, and changes in texture. Some women also have hair loss during or after pregnancy, or hair that feels dry or thin. Your hair will most likely return to normal after your baby is born.  Your breasts will continue to grow and they will continue to become tender. A yellow fluid (colostrum) may leak from your breasts. This is the first milk you are producing for your baby.  Your belly button may stick out.  You may notice more swelling in your hands,  face, or ankles.  You may have increased tingling or numbness in your hands, arms, and legs. The skin on your belly may also feel numb.  You may feel short of breath because of your expanding uterus.  You may have more problems sleeping. This can be caused by the size of your belly, increased need to urinate, and an increase in your body's metabolism.  You may notice the fetus "dropping," or moving lower in your abdomen (lightening).  You may have increased vaginal discharge.  You may notice your joints feel loose and you may have pain around your pelvic bone.  What to expect at prenatal visits You will have prenatal exams every 2 weeks until week 36. Then you will have weekly prenatal exams. During a routine prenatal visit:  You will be weighed to make sure you and the baby are growing normally.  Your blood pressure will be taken.  Your abdomen will be measured to track your baby's growth.  The fetal heartbeat will be listened to.  Any test results from the previous visit will be discussed.  You may have a cervical check near your due date to see if your cervix has softened or thinned (effaced).  You will be tested for Group B streptococcus. This happens between 35 and 37 weeks.  Your health care provider may ask you:  What your birth plan is.  How you are feeling.  If you are feeling the baby move.  If you have had   any abnormal symptoms, such as leaking fluid, bleeding, severe headaches, or abdominal cramping.  If you are using any tobacco products, including cigarettes, chewing tobacco, and electronic cigarettes.  If you have any questions.  Other tests or screenings that may be performed during your third trimester include:  Blood tests that check for low iron levels (anemia).  Fetal testing to check the health, activity level, and growth of the fetus. Testing is done if you have certain medical conditions or if there are problems during the  pregnancy.  Nonstress test (NST). This test checks the health of your baby to make sure there are no signs of problems, such as the baby not getting enough oxygen. During this test, a belt is placed around your belly. The baby is made to move, and its heart rate is monitored during movement.  What is false labor? False labor is a condition in which you feel small, irregular tightenings of the muscles in the womb (contractions) that usually go away with rest, changing position, or drinking water. These are called Braxton Hicks contractions. Contractions may last for hours, days, or even weeks before true labor sets in. If contractions come at regular intervals, become more frequent, increase in intensity, or become painful, you should see your health care provider. What are the signs of labor?  Abdominal cramps.  Regular contractions that start at 10 minutes apart and become stronger and more frequent with time.  Contractions that start on the top of the uterus and spread down to the lower abdomen and back.  Increased pelvic pressure and dull back pain.  A watery or bloody mucus discharge that comes from the vagina.  Leaking of amniotic fluid. This is also known as your "water breaking." It could be a slow trickle or a gush. Let your health care provider know if it has a color or strange odor. If you have any of these signs, call your health care provider right away, even if it is before your due date. Follow these instructions at home: Medicines  Follow your health care provider's instructions regarding medicine use. Specific medicines may be either safe or unsafe to take during pregnancy.  Take a prenatal vitamin that contains at least 600 micrograms (mcg) of folic acid.  If you develop constipation, try taking a stool softener if your health care provider approves. Eating and drinking  Eat a balanced diet that includes fresh fruits and vegetables, whole grains, good sources of protein  such as meat, eggs, or tofu, and low-fat dairy. Your health care provider will help you determine the amount of weight gain that is right for you.  Avoid raw meat and uncooked cheese. These carry germs that can cause birth defects in the baby.  If you have low calcium intake from food, talk to your health care provider about whether you should take a daily calcium supplement.  Eat four or five small meals rather than three large meals a day.  Limit foods that are high in fat and processed sugars, such as fried and sweet foods.  To prevent constipation: ? Drink enough fluid to keep your urine clear or pale yellow. ? Eat foods that are high in fiber, such as fresh fruits and vegetables, whole grains, and beans. Activity  Exercise only as directed by your health care provider. Most women can continue their usual exercise routine during pregnancy. Try to exercise for 30 minutes at least 5 days a week. Stop exercising if you experience uterine contractions.  Avoid heavy   lifting.  Do not exercise in extreme heat or humidity, or at high altitudes.  Wear low-heel, comfortable shoes.  Practice good posture.  You may continue to have sex unless your health care provider tells you otherwise. Relieving pain and discomfort  Take frequent breaks and rest with your legs elevated if you have leg cramps or low back pain.  Take warm sitz baths to soothe any pain or discomfort caused by hemorrhoids. Use hemorrhoid cream if your health care provider approves.  Wear a good support bra to prevent discomfort from breast tenderness.  If you develop varicose veins: ? Wear support pantyhose or compression stockings as told by your healthcare provider. ? Elevate your feet for 15 minutes, 3-4 times a day. Prenatal care  Write down your questions. Take them to your prenatal visits.  Keep all your prenatal visits as told by your health care provider. This is important. Safety  Wear your seat belt at  all times when driving.  Make a list of emergency phone numbers, including numbers for family, friends, the hospital, and police and fire departments. General instructions  Avoid cat litter boxes and soil used by cats. These carry germs that can cause birth defects in the baby. If you have a cat, ask someone to clean the litter box for you.  Do not travel far distances unless it is absolutely necessary and only with the approval of your health care provider.  Do not use hot tubs, steam rooms, or saunas.  Do not drink alcohol.  Do not use any products that contain nicotine or tobacco, such as cigarettes and e-cigarettes. If you need help quitting, ask your health care provider.  Do not use any medicinal herbs or unprescribed drugs. These chemicals affect the formation and growth of the baby.  Do not douche or use tampons or scented sanitary pads.  Do not cross your legs for long periods of time.  To prepare for the arrival of your baby: ? Take prenatal classes to understand, practice, and ask questions about labor and delivery. ? Make a trial run to the hospital. ? Visit the hospital and tour the maternity area. ? Arrange for maternity or paternity leave through employers. ? Arrange for family and friends to take care of pets while you are in the hospital. ? Purchase a rear-facing car seat and make sure you know how to install it in your car. ? Pack your hospital bag. ? Prepare the baby's nursery. Make sure to remove all pillows and stuffed animals from the baby's crib to prevent suffocation.  Visit your dentist if you have not gone during your pregnancy. Use a soft toothbrush to brush your teeth and be gentle when you floss. Contact a health care provider if:  You are unsure if you are in labor or if your water has broken.  You become dizzy.  You have mild pelvic cramps, pelvic pressure, or nagging pain in your abdominal area.  You have lower back pain.  You have persistent  nausea, vomiting, or diarrhea.  You have an unusual or bad smelling vaginal discharge.  You have pain when you urinate. Get help right away if:  Your water breaks before 37 weeks.  You have regular contractions less than 5 minutes apart before 37 weeks.  You have a fever.  You are leaking fluid from your vagina.  You have spotting or bleeding from your vagina.  You have severe abdominal pain or cramping.  You have rapid weight loss or weight gain.    You have shortness of breath with chest pain.  You notice sudden or extreme swelling of your face, hands, ankles, feet, or legs.  Your baby makes fewer than 10 movements in 2 hours.  You have severe headaches that do not go away when you take medicine.  You have vision changes. Summary  The third trimester is from week 28 through week 40, months 7 through 9. The third trimester is a time when the unborn baby (fetus) is growing rapidly.  During the third trimester, your discomfort may increase as you and your baby continue to gain weight. You may have abdominal, leg, and back pain, sleeping problems, and an increased need to urinate.  During the third trimester your breasts will keep growing and they will continue to become tender. A yellow fluid (colostrum) may leak from your breasts. This is the first milk you are producing for your baby.  False labor is a condition in which you feel small, irregular tightenings of the muscles in the womb (contractions) that eventually go away. These are called Braxton Hicks contractions. Contractions may last for hours, days, or even weeks before true labor sets in.  Signs of labor can include: abdominal cramps; regular contractions that start at 10 minutes apart and become stronger and more frequent with time; watery or bloody mucus discharge that comes from the vagina; increased pelvic pressure and dull back pain; and leaking of amniotic fluid. This information is not intended to replace advice  given to you by your health care provider. Make sure you discuss any questions you have with your health care provider. Document Released: 03/03/2001 Document Revised: 08/15/2015 Document Reviewed: 05/10/2012 Elsevier Interactive Patient Education  2017 [employer]  

## 2016-08-11 NOTE — Progress Notes (Signed)
Guidelines for Antenatal Testing and Sonography  (with updated ICD-10 codes) INDICATION U/S NST/AFI DELIVERY  Diabetes   A1 - good control - O24.410    A2 - good control - O24.419      A2  - poor control or poor compliance - O24.419, E11.65   (Macrosomia or polyhydramnios) **E11.65 is extra code for poor control**    A2/B - O24.919  and B-C O24.319  Poor control B-C or D-R-F-T - O24.319  or  Type I DM - O24.019  20-38  20-38  20-24-28-32-36   20-24-28-32-35-38//fetal echo  20-24-27-30-33-36-38//fetal echo  40  32//2 x wk  32//2 x wk   32//2 x wk  28//BPP wkly then 32//2 x wk  40  39  PRN   39  PRN  CHTN - O10.919  Group I   BP < 140/90, no preeclampsia, AGA,  nml AFV, +/- meds    Group II   BP > 140/90, on meds, no preeclampsia, AGA, nml AFV  20-28-34-38  20-24-28-32-35-38  32//2 x wk  28//BPP wkly then 32//2 x wk  40 no meds; 39 meds  PRN or 37  Pre-eclampsia  GHTN - O13.9/Preeclampsia without severe features  - O14.00   Preeclampsia with severe features - O14.10  Q  3-4wks  Q 2 wks  28//BPP wkly then 32//2 x wk  Inpatient  37  PRN or 34  IUGR- O36.5990   EFW < 10% w/ AEDF & low AFV or EFW < 3%     EFW < 10%, Nml Dopplers & AFV, AC<3%, no other comorbidities  PRN  20-24-28-30-32-34-36-38  Inpatient  24//BPP+dopplers wkly, then 32//2 x wk  PRN  PRN or 39  Multiple Gestation       MC/DA - O30.039  Concordant (< 20%), nml AFV, AGA    Discordant (>20%)**  Twin-twin Transfusion Syndrome - O43.029              DC/DA - O30.049  Concordant (<20%), nml AFV, AGA, no other comorbidities  Discordant (> 20%)**    O30.003 is extra code for discordant growth   Q 3 wks 16-32, Q 2 wks 32-delivery Q 1 wk, Fluid alternating w/ growth   20-24-28-32-36 Q 2-3 wks   32//2 x wk 24//BPP wkly then 32//2 x wk   35//2 x wk 28//BPP wkly then 32//2 x wk   37-38 PRN   38 PRN or 37   Advanced Maternal Age > 71 y.o. - O32.519 (primip),  O09.529 (multip) 20-24-28-32-36 36//2 x wk 40  Previous Stillbirth (> 28 wks) - O09.299 20-24-28-32-36 28//BPP wkly then 32//2 x wk 39  Oligohydramnios - O41.00X0    AFV < 5, AGA, nml anatomy, no other comorbidities  Q 2 wks  28//BPP wkly then 32//2 x wk  PRN or 37  Cholestasis - O26.619, K83.1 At diagnosis, then Q 3 wks 28//BPP wkly then 32//2 x wk PRN or 37  Polyhydramnios - O40.9XX0    Nml anatomy 20-24-28-32-36 28//BPP wkly then 32//2 x wk 39  Renal Disease - O26.839, N28.9  Cr > 1.2, proteinuria 20-24-28-32-36 28//BPP wkly then 32//2 x wk 39  SLE (lupus) - O26.899, M32.9 20-24-28-32-36 32//2 x wk 39  Sickle Cell Disease - O99.019, D57.1 20-24-28-32-36 32//2 x wk 39  Graves Disease - O99.280, E05.90 20-24-28-32-36 32//2 x wk 39  HIV - O98.719 20-28-36 PRN 39  Decreased Fetal Movement- O36.8190  2 x wk PRN; BPP wkly if < 32 wks   Postdates - O48.0  40//2  x wk 41-42  **2 x week testing = NST 2 x week + AFI weekly**     **If NST is nonreactive, will then need formal BPP**   **Weekly BPP can be used in lieu OF 2 x week testing as per MFM** **PRN delivery time could be based on MD/MFM decision, FLM or other criteria

## 2016-08-27 ENCOUNTER — Ambulatory Visit (INDEPENDENT_AMBULATORY_CARE_PROVIDER_SITE_OTHER): Payer: Medicaid Other | Admitting: Obstetrics & Gynecology

## 2016-08-27 VITALS — BP 131/82 | HR 118 | Wt 219.1 lb

## 2016-08-27 DIAGNOSIS — O09213 Supervision of pregnancy with history of pre-term labor, third trimester: Secondary | ICD-10-CM | POA: Diagnosis not present

## 2016-08-27 DIAGNOSIS — O09899 Supervision of other high risk pregnancies, unspecified trimester: Secondary | ICD-10-CM

## 2016-08-27 DIAGNOSIS — O09219 Supervision of pregnancy with history of pre-term labor, unspecified trimester: Secondary | ICD-10-CM

## 2016-08-27 DIAGNOSIS — O0993 Supervision of high risk pregnancy, unspecified, third trimester: Secondary | ICD-10-CM | POA: Diagnosis present

## 2016-08-27 DIAGNOSIS — O0990 Supervision of high risk pregnancy, unspecified, unspecified trimester: Secondary | ICD-10-CM

## 2016-08-27 LAB — OB RESULTS CONSOLE GBS: GBS: NEGATIVE

## 2016-08-27 NOTE — Progress Notes (Signed)
   PRENATAL VISIT NOTE  Subjective:  [name] is a 31 y.o. [phone] at 1432w2d being seen today for ongoing prenatal care.  She is currently monitored for the following issues for this high-risk pregnancy and has Human immunodeficiency virus (HIV) disease (HCC); ASTHMA; CHLAMYDIAL INFECTION, HX OF; Obesity affecting pregnancy; Supervision of high risk pregnancy, antepartum; and History of preterm delivery, currently pregnant on her problem list.  Patient reports no complaints.  Contractions: Irregular. Vag. Bleeding: None.  Movement: Present. Denies leaking of fluid.   The following portions of the patient's history were reviewed and updated as appropriate: allergies, current medications, past family history, past medical history, past social history, past surgical history and problem list. Problem list updated.  Objective:   Vitals:   08/27/16 0926 08/27/16 0929  BP: (!) 144/90 131/82  Pulse: 96 (!) 118  Weight: 219 lb 1.6 oz (99.4 kg)     Fetal Status: Fetal Heart Rate (bpm): 150   Movement: Present     General:  Alert, oriented and cooperative. Patient is in no acute distress.  Skin: Skin is warm and dry. No rash noted.   Cardiovascular: Normal heart rate noted  Respiratory: Normal respiratory effort, no problems with respiration noted  Abdomen: Soft, gravid, appropriate for gestational age. Pain/Pressure: Present     Pelvic:  Cervical exam performed        Extremities: Normal range of motion.  Edema: None  Mental Status: Normal mood and affect. Normal behavior. Normal judgment and thought content.   Assessment and Plan:  Pregnancy: [id number] at 2832w2d  1. Supervision of high risk pregnancy, antepartum  - Culture, beta strep (group b only) - GC/Chlamydia Probe Amp  2. History of preterm delivery, currently pregnant Full term  Term labor symptoms and general obstetric precautions including but not limited to vaginal bleeding, contractions, leaking of fluid and fetal  movement were reviewed in detail with the patient. Please refer to After Visit Summary for other counseling recommendations.  Return in about 1 week (around 09/03/2016).   Scheryl DarterJames Kamaiya Antilla, MD

## 2016-08-27 NOTE — Patient Instructions (Signed)
Vaginal Delivery Vaginal delivery means that you will give birth by pushing your baby out of your birth canal (vagina). A team of health care providers will help you before, during, and after vaginal delivery. Birth experiences are unique for every woman and every pregnancy, and birth experiences vary depending on where you choose to give birth. What should I do to prepare for my baby's birth? Before your baby is born, it is important to talk with your health care provider about:  Your labor and delivery preferences. These may include: ? Medicines that you may be given. ? How you will manage your pain. This might include non-medical pain relief techniques or injectable pain relief such as epidural analgesia. ? How you and your baby will be monitored during labor and delivery. ? Who may be in the labor and delivery room with you. ? Your feelings about surgical delivery of your baby (cesarean delivery, or C-section) if this becomes necessary. ? Your feelings about receiving donated blood through an IV tube (blood transfusion) if this becomes necessary.  Whether you are able: ? To take pictures or videos of the birth. ? To eat during labor and delivery. ? To move around, walk, or change positions during labor and delivery.  What to expect after your baby is born, such as: ? Whether delayed umbilical cord clamping and cutting is offered. ? Who will care for your baby right after birth. ? Medicines or tests that may be recommended for your baby. ? Whether breastfeeding is supported in your hospital or birth center. ? How long you will be in the hospital or birth center.  How any medical conditions you have may affect your baby or your labor and delivery experience.  To prepare for your baby's birth, you should also:  Attend all of your health care visits before delivery (prenatal visits) as recommended by your health care provider. This is important.  Prepare your home for your baby's  arrival. Make sure that you have: ? Diapers. ? Baby clothing. ? Feeding equipment. ? Safe sleeping arrangements for you and your baby.  Install a car seat in your vehicle. Have your car seat checked by a certified car seat installer to make sure that it is installed safely.  Think about who will help you with your new baby at home for at least the first several weeks after delivery.  What can I expect when I arrive at the birth center or hospital? Once you are in labor and have been admitted into the hospital or birth center, your health care provider may:  Review your pregnancy history and any concerns you have.  Insert an IV tube into one of your veins. This is used to give you fluids and medicines.  Check your blood pressure, pulse, temperature, and heart rate (vital signs).  Check whether your bag of water (amniotic sac) has broken (ruptured).  Talk with you about your birth plan and discuss pain control options.  Monitoring Your health care provider may monitor your contractions (uterine monitoring) and your baby's heart rate (fetal monitoring). You may need to be monitored:  Often, but not continuously (intermittently).  All the time or for long periods at a time (continuously). Continuous monitoring may be needed if: ? You are taking certain medicines, such as medicine to relieve pain or make your contractions stronger. ? You have pregnancy or labor complications.  Monitoring may be done by:  Placing a special stethoscope or a handheld monitoring device on your abdomen to   check your baby's heartbeat, and feeling your abdomen for contractions. This method of monitoring does not continuously record your baby's heartbeat or your contractions.  Placing monitors on your abdomen (external monitors) to record your baby's heartbeat and the frequency and length of contractions. You may not have to wear external monitors all the time.  Placing monitors inside of your uterus  (internal monitors) to record your baby's heartbeat and the frequency, length, and strength of your contractions. ? Your health care provider may use internal monitors if he or she needs more information about the strength of your contractions or your baby's heart rate. ? Internal monitors are put in place by passing a thin, flexible wire through your vagina and into your uterus. Depending on the type of monitor, it may remain in your uterus or on your baby's head until birth. ? Your health care provider will discuss the benefits and risks of internal monitoring with you and will ask for your permission before inserting the monitors.  Telemetry. This is a type of continuous monitoring that can be done with external or internal monitors. Instead of having to stay in bed, you are able to move around during telemetry. Ask your health care provider if telemetry is an option for you.  Physical exam Your health care provider may perform a physical exam. This may include:  Checking whether your baby is positioned: ? With the head toward your vagina (head-down). This is most common. ? With the head toward the top of your uterus (head-up or breech). If your baby is in a breech position, your health care provider may try to turn your baby to a head-down position so you can deliver vaginally. If it does not seem that your baby can be born vaginally, your provider may recommend surgery to deliver your baby. In rare cases, you may be able to deliver vaginally if your baby is head-up (breech delivery). ? Lying sideways (transverse). Babies that are lying sideways cannot be delivered vaginally.  Checking your cervix to determine: ? Whether it is thinning out (effacing). ? Whether it is opening up (dilating). ? How low your baby has moved into your birth canal.  What are the three stages of labor and delivery?  Normal labor and delivery is divided into the following three stages: Stage 1  Stage 1 is the  longest stage of labor, and it can last for hours or days. Stage 1 includes: ? Early labor. This is when contractions may be irregular, or regular and mild. Generally, early labor contractions are more than 10 minutes apart. ? Active labor. This is when contractions get longer, more regular, more frequent, and more intense. ? The transition phase. This is when contractions happen very close together, are very intense, and may last longer than during any other part of labor.  Contractions generally feel mild, infrequent, and irregular at first. They get stronger, more frequent (about every 2-3 minutes), and more regular as you progress from early labor through active labor and transition.  Many women progress through stage 1 naturally, but you may need help to continue making progress. If this happens, your health care provider may talk with you about: ? Rupturing your amniotic sac if it has not ruptured yet. ? Giving you medicine to help make your contractions stronger and more frequent.  Stage 1 ends when your cervix is completely dilated to 4 inches (10 cm) and completely effaced. This happens at the end of the transition phase. Stage 2  Once   your cervix is completely effaced and dilated to 4 inches (10 cm), you may start to feel an urge to push. It is common for the body to naturally take a rest before feeling the urge to push, especially if you received an epidural or certain other pain medicines. This rest period may last for up to 1-2 hours, depending on your unique labor experience.  During stage 2, contractions are generally less painful, because pushing helps relieve contraction pain. Instead of contraction pain, you may feel stretching and burning pain, especially when the widest part of your baby's head passes through the vaginal opening (crowning).  Your health care provider will closely monitor your pushing progress and your baby's progress through the vagina during stage 2.  Your  health care provider may massage the area of skin between your vaginal opening and anus (perineum) or apply warm compresses to your perineum. This helps it stretch as the baby's head starts to crown, which can help prevent perineal tearing. ? In some cases, an incision may be made in your perineum (episiotomy) to allow the baby to pass through the vaginal opening. An episiotomy helps to make the opening of the vagina larger to allow more room for the baby to fit through.  It is very important to breathe and focus so your health care provider can control the delivery of your baby's head. Your health care provider may have you decrease the intensity of your pushing, to help prevent perineal tearing.  After delivery of your baby's head, the shoulders and the rest of the body generally deliver very quickly and without difficulty.  Once your baby is delivered, the umbilical cord may be cut right away, or this may be delayed for 1-2 minutes, depending on your baby's health. This may vary among health care providers, hospitals, and birth centers.  If you and your baby are healthy enough, your baby may be placed on your chest or abdomen to help maintain the baby's temperature and to help you bond with each other. Some mothers and babies start breastfeeding at this time. Your health care team will dry your baby and help keep your baby warm during this time.  Your baby may need immediate care if he or she: ? Showed signs of distress during labor. ? Has a medical condition. ? Was born too early (prematurely). ? Had a bowel movement before birth (meconium). ? Shows signs of difficulty transitioning from being inside the uterus to being outside of the uterus. If you are planning to breastfeed, your health care team will help you begin a feeding. Stage 3  The third stage of labor starts immediately after the birth of your baby and ends after you deliver the placenta. The placenta is an organ that develops  during pregnancy to provide oxygen and nutrients to your baby in the womb.  Delivering the placenta may require some pushing, and you may have mild contractions. Breastfeeding can stimulate contractions to help you deliver the placenta.  After the placenta is delivered, your uterus should tighten (contract) and become firm. This helps to stop bleeding in your uterus. To help your uterus contract and to control bleeding, your health care provider may: ? Give you medicine by injection, through an IV tube, by mouth, or through your rectum (rectally). ? Massage your abdomen or perform a vaginal exam to remove any blood clots that are left in your uterus. ? Empty your bladder by placing a thin, flexible tube (catheter) into your bladder. ? Encourage   you to breastfeed your baby. After labor is over, you and your baby will be monitored closely to ensure that you are both healthy until you are ready to go home. Your health care team will teach you how to care for yourself and your baby. This information is not intended to replace advice given to you by your health care provider. Make sure you discuss any questions you have with your health care provider. Document Released: 12/17/2007 Document Revised: 09/27/2015 Document Reviewed: 03/24/2015 Elsevier Interactive Patient Education  2018 [employer]  

## 2016-08-29 LAB — GC/CHLAMYDIA PROBE AMP
Chlamydia trachomatis, NAA: NEGATIVE
Neisseria gonorrhoeae by PCR: NEGATIVE

## 2016-08-31 LAB — CULTURE, BETA STREP (GROUP B ONLY): STREP GP B CULTURE: NEGATIVE

## 2016-09-04 ENCOUNTER — Inpatient Hospital Stay (HOSPITAL_COMMUNITY)
Admission: AD | Admit: 2016-09-04 | Discharge: 2016-09-07 | DRG: 774 | Disposition: A | Discharge status: Home / Self Care | Payer: Medicaid Other | Source: Ambulatory Visit | Attending: Obstetrics and Gynecology | Admitting: Obstetrics and Gynecology

## 2016-09-04 ENCOUNTER — Inpatient Hospital Stay (HOSPITAL_COMMUNITY): Payer: Medicaid Other | Admitting: Anesthesiology

## 2016-09-04 ENCOUNTER — Encounter (HOSPITAL_COMMUNITY): Payer: Self-pay

## 2016-09-04 ENCOUNTER — Ambulatory Visit (INDEPENDENT_AMBULATORY_CARE_PROVIDER_SITE_OTHER): Payer: Medicaid Other | Admitting: Obstetrics & Gynecology

## 2016-09-04 ENCOUNTER — Encounter: Payer: Medicaid Other | Admitting: Obstetrics & Gynecology

## 2016-09-04 VITALS — BP 160/82 | HR 90 | Wt 220.0 lb

## 2016-09-04 DIAGNOSIS — O99213 Obesity complicating pregnancy, third trimester: Secondary | ICD-10-CM | POA: Diagnosis not present

## 2016-09-04 DIAGNOSIS — O0993 Supervision of high risk pregnancy, unspecified, third trimester: Secondary | ICD-10-CM

## 2016-09-04 DIAGNOSIS — F17210 Nicotine dependence, cigarettes, uncomplicated: Secondary | ICD-10-CM | POA: Diagnosis present

## 2016-09-04 DIAGNOSIS — O99214 Obesity complicating childbirth: Secondary | ICD-10-CM | POA: Diagnosis present

## 2016-09-04 DIAGNOSIS — O0990 Supervision of high risk pregnancy, unspecified, unspecified trimester: Secondary | ICD-10-CM

## 2016-09-04 DIAGNOSIS — Z3A38 38 weeks gestation of pregnancy: Secondary | ICD-10-CM | POA: Diagnosis not present

## 2016-09-04 DIAGNOSIS — Z6841 Body Mass Index (BMI) 40.0 and over, adult: Secondary | ICD-10-CM | POA: Diagnosis not present

## 2016-09-04 DIAGNOSIS — O98713 Human immunodeficiency virus [HIV] disease complicating pregnancy, third trimester: Secondary | ICD-10-CM | POA: Diagnosis not present

## 2016-09-04 DIAGNOSIS — O9872 Human immunodeficiency virus [HIV] disease complicating childbirth: Secondary | ICD-10-CM | POA: Diagnosis present

## 2016-09-04 DIAGNOSIS — O1414 Severe pre-eclampsia complicating childbirth: Principal | ICD-10-CM | POA: Diagnosis present

## 2016-09-04 DIAGNOSIS — B20 Human immunodeficiency virus [HIV] disease: Secondary | ICD-10-CM | POA: Diagnosis present

## 2016-09-04 DIAGNOSIS — O133 Gestational [pregnancy-induced] hypertension without significant proteinuria, third trimester: Secondary | ICD-10-CM | POA: Diagnosis present

## 2016-09-04 DIAGNOSIS — O134 Gestational [pregnancy-induced] hypertension without significant proteinuria, complicating childbirth: Secondary | ICD-10-CM | POA: Diagnosis present

## 2016-09-04 DIAGNOSIS — O99334 Smoking (tobacco) complicating childbirth: Secondary | ICD-10-CM | POA: Diagnosis present

## 2016-09-04 DIAGNOSIS — Z21 Asymptomatic human immunodeficiency virus [HIV] infection status: Secondary | ICD-10-CM | POA: Diagnosis present

## 2016-09-04 DIAGNOSIS — O1413 Severe pre-eclampsia, third trimester: Secondary | ICD-10-CM

## 2016-09-04 DIAGNOSIS — Z8759 Personal history of other complications of pregnancy, childbirth and the puerperium: Secondary | ICD-10-CM

## 2016-09-04 DIAGNOSIS — O1410 Severe pre-eclampsia, unspecified trimester: Secondary | ICD-10-CM

## 2016-09-04 LAB — CBC
HCT: 34.9 % — ABNORMAL LOW (ref 36.0–46.0)
HEMATOCRIT: 34.5 % — AB (ref 36.0–46.0)
HEMOGLOBIN: 11.1 g/dL — AB (ref 12.0–15.0)
HEMOGLOBIN: 11.3 g/dL — AB (ref 12.0–15.0)
MCH: 29.3 pg (ref 26.0–34.0)
MCH: 29.3 pg (ref 26.0–34.0)
MCHC: 32.2 g/dL (ref 30.0–36.0)
MCHC: 32.4 g/dL (ref 30.0–36.0)
MCV: 91 fL (ref 78.0–100.0)
MCV: 90.4 fL (ref 78.0–100.0)
PLATELETS: 221 10*3/uL (ref 150–400)
Platelets: 209 10*3/uL (ref 150–400)
RBC: 3.79 MIL/uL — ABNORMAL LOW (ref 3.87–5.11)
RBC: 3.86 MIL/uL — ABNORMAL LOW (ref 3.87–5.11)
RDW: 14.5 % (ref 11.5–15.5)
RDW: 14.6 % (ref 11.5–15.5)
WBC: 11.4 10*3/uL — ABNORMAL HIGH (ref 4.0–10.5)
WBC: 9.8 10*3/uL (ref 4.0–10.5)

## 2016-09-04 LAB — COMPREHENSIVE METABOLIC PANEL
ALBUMIN: 3 g/dL — AB (ref 3.5–5.0)
ALT: 9 U/L — ABNORMAL LOW (ref 14–54)
AST: 12 U/L — AB (ref 15–41)
Alkaline Phosphatase: 76 U/L (ref 38–126)
Anion gap: 6 (ref 5–15)
BILIRUBIN TOTAL: 0.5 mg/dL (ref 0.3–1.2)
BUN: 10 mg/dL (ref 6–20)
CALCIUM: 8.9 mg/dL (ref 8.9–10.3)
CO2: 23 mmol/L (ref 22–32)
Chloride: 106 mmol/L (ref 101–111)
Creatinine, Ser: 0.52 mg/dL (ref 0.44–1.00)
GFR calc Af Amer: >60 mL/min (ref >60)
GFR calc non Af Amer: >60 mL/min (ref >60)
GLUCOSE: 88 mg/dL (ref 65–99)
POTASSIUM: 3.8 mmol/L (ref 3.5–5.1)
Sodium: 135 mmol/L (ref 135–145)
TOTAL PROTEIN: 8 g/dL (ref 6.5–8.1)

## 2016-09-04 LAB — URINALYSIS, ROUTINE W REFLEX MICROSCOPIC
Bilirubin Urine: NEGATIVE
Glucose, UA: NEGATIVE mg/dL
Hgb urine dipstick: NEGATIVE
Ketones, ur: NEGATIVE mg/dL
Nitrite: NEGATIVE
PROTEIN: NEGATIVE mg/dL
Specific Gravity, Urine: 1.017 (ref 1.005–1.030)
pH: 7 (ref 5.0–8.0)

## 2016-09-04 LAB — MAGNESIUM: MAGNESIUM: 4.7 mg/dL — AB (ref 1.7–2.4)

## 2016-09-04 LAB — TYPE AND SCREEN
ABO/RH(D): O POS
ANTIBODY SCREEN: NEGATIVE

## 2016-09-04 LAB — POCT URINALYSIS DIP (DEVICE)
Bilirubin Urine: NEGATIVE
Glucose, UA: NEGATIVE mg/dL
HGB URINE DIPSTICK: NEGATIVE
Ketones, ur: NEGATIVE mg/dL
Leukocytes, UA: NEGATIVE
Nitrite: NEGATIVE
PH: 7 (ref 5.0–8.0)
PROTEIN: NEGATIVE mg/dL
SPECIFIC GRAVITY, URINE: 1.015 (ref 1.005–1.030)
Urobilinogen, UA: 0.2 mg/dL (ref 0.0–1.0)

## 2016-09-04 LAB — PROTEIN / CREATININE RATIO, URINE
Creatinine, Urine: 117 mg/dL
Protein Creatinine Ratio: 0.17 mg/mg{Cre} — ABNORMAL HIGH (ref 0.00–0.15)
Total Protein, Urine: 20 mg/dL

## 2016-09-04 MED ORDER — ACETAMINOPHEN 325 MG PO TABS
650.0000 mg | ORAL_TABLET | ORAL | Status: DC | PRN
  Administered 2016-09-04 (×2): 650 mg via ORAL
  Filled 2016-09-04 (×2): qty 2

## 2016-09-04 MED ORDER — OXYTOCIN 40 UNITS IN LACTATED RINGERS INFUSION - SIMPLE MED: 2.5000 [IU]/h | INTRAVENOUS | Status: DC

## 2016-09-04 MED ORDER — HYDRALAZINE HCL 20 MG/ML IJ SOLN: 10.0000 mg | Freq: Once | INTRAMUSCULAR | Status: DC | PRN

## 2016-09-04 MED ORDER — EPHEDRINE 5 MG/ML INJ: 10.0000 mg | INTRAVENOUS | Status: DC | PRN

## 2016-09-04 MED ORDER — OXYTOCIN 40 UNITS IN LACTATED RINGERS INFUSION - SIMPLE MED
1.0000 m[IU]/min | INTRAVENOUS | Status: DC
  Administered 2016-09-04: 2 m[IU]/min via INTRAVENOUS
  Filled 2016-09-04: qty 1000

## 2016-09-04 MED ORDER — LIDOCAINE HCL (PF) 1 % IJ SOLN
INTRAMUSCULAR | Status: DC | PRN
  Administered 2016-09-04: 5 mL via EPIDURAL

## 2016-09-04 MED ORDER — LABETALOL HCL 5 MG/ML IV SOLN: 20.0000 mg | INTRAVENOUS | Status: DC | PRN

## 2016-09-04 MED ORDER — PHENYLEPHRINE 40 MCG/ML (10ML) SYRINGE FOR IV PUSH (FOR BLOOD PRESSURE SUPPORT): 80.0000 ug | PREFILLED_SYRINGE | INTRAVENOUS | Status: DC | PRN

## 2016-09-04 MED ORDER — PHENYLEPHRINE 40 MCG/ML (10ML) SYRINGE FOR IV PUSH (FOR BLOOD PRESSURE SUPPORT)
80.0000 ug | PREFILLED_SYRINGE | INTRAVENOUS | Status: DC | PRN
  Administered 2016-09-04: 80 ug via INTRAVENOUS
  Filled 2016-09-04: qty 5

## 2016-09-04 MED ORDER — DIPHENHYDRAMINE HCL 50 MG/ML IJ SOLN: 12.5000 mg | INTRAMUSCULAR | Status: DC | PRN

## 2016-09-04 MED ORDER — LACTATED RINGERS IV SOLN: 500.0000 mL | Freq: Once | INTRAVENOUS | Status: DC

## 2016-09-04 MED ORDER — MAGNESIUM SULFATE 40 G IN LACTATED RINGERS - SIMPLE
2.0000 g/h | INTRAVENOUS | Status: DC
  Administered 2016-09-04: 2 g/h via INTRAVENOUS
  Filled 2016-09-04: qty 40

## 2016-09-04 MED ORDER — EPHEDRINE 5 MG/ML INJ
10.0000 mg | INTRAVENOUS | Status: DC | PRN
  Filled 2016-09-04: qty 2

## 2016-09-04 MED ORDER — OXYCODONE-ACETAMINOPHEN 5-325 MG PO TABS: 1.0000 | ORAL_TABLET | ORAL | Status: DC | PRN

## 2016-09-04 MED ORDER — LACTATED RINGERS IV SOLN
INTRAVENOUS | Status: DC
  Administered 2016-09-04: 125 mL/h via INTRAVENOUS

## 2016-09-04 MED ORDER — FENTANYL 2.5 MCG/ML BUPIVACAINE 1/10 % EPIDURAL INFUSION (WH - ANES): 14.0000 mL/h | INTRAMUSCULAR | Status: DC | PRN

## 2016-09-04 MED ORDER — MAGNESIUM SULFATE BOLUS VIA INFUSION
4.0000 g | Freq: Once | INTRAVENOUS | Status: AC
  Administered 2016-09-04: 4 g via INTRAVENOUS
  Filled 2016-09-04: qty 500

## 2016-09-04 MED ORDER — TERBUTALINE SULFATE 1 MG/ML IJ SOLN: 0.2500 mg | Freq: Once | INTRAMUSCULAR | Status: DC | PRN

## 2016-09-04 MED ORDER — OXYCODONE-ACETAMINOPHEN 5-325 MG PO TABS: 2.0000 | ORAL_TABLET | ORAL | Status: DC | PRN

## 2016-09-04 MED ORDER — LIDOCAINE HCL (PF) 1 % IJ SOLN
30.0000 mL | INTRAMUSCULAR | Status: DC | PRN
  Filled 2016-09-04: qty 30

## 2016-09-04 MED ORDER — FENTANYL 2.5 MCG/ML BUPIVACAINE 1/10 % EPIDURAL INFUSION (WH - ANES)
14.0000 mL/h | INTRAMUSCULAR | Status: DC | PRN
  Administered 2016-09-04: 14 mL/h via EPIDURAL
  Filled 2016-09-04: qty 100

## 2016-09-04 MED ORDER — PHENYLEPHRINE 40 MCG/ML (10ML) SYRINGE FOR IV PUSH (FOR BLOOD PRESSURE SUPPORT)
80.0000 ug | PREFILLED_SYRINGE | INTRAVENOUS | Status: DC | PRN
  Filled 2016-09-04: qty 10
  Filled 2016-09-04: qty 5

## 2016-09-04 MED ORDER — ZIDOVUDINE 10 MG/ML IV SOLN
1.0000 mg/kg/h | INTRAVENOUS | Status: DC
  Administered 2016-09-04: 1 mg/kg/h via INTRAVENOUS
  Filled 2016-09-04 (×3): qty 40

## 2016-09-04 MED ORDER — OXYTOCIN BOLUS FROM INFUSION
500.0000 mL | Freq: Once | INTRAVENOUS | Status: AC
  Administered 2016-09-05: 500 mL via INTRAVENOUS

## 2016-09-04 MED ORDER — ZIDOVUDINE 10 MG/ML IV SOLN
2.0000 mg/kg | Freq: Once | INTRAVENOUS | Status: AC
  Administered 2016-09-04: 199 mg via INTRAVENOUS
  Filled 2016-09-04: qty 19.9

## 2016-09-04 MED ORDER — LACTATED RINGERS IV SOLN: 500.0000 mL | INTRAVENOUS | Status: DC | PRN

## 2016-09-04 MED ORDER — ONDANSETRON HCL 4 MG/2ML IJ SOLN
4.0000 mg | Freq: Four times a day (QID) | INTRAMUSCULAR | Status: DC | PRN
  Administered 2016-09-05: 4 mg via INTRAVENOUS
  Filled 2016-09-04: qty 2

## 2016-09-04 MED ORDER — SOD CITRATE-CITRIC ACID 500-334 MG/5ML PO SOLN
30.0000 mL | ORAL | Status: DC | PRN
  Administered 2016-09-04: 30 mL via ORAL
  Filled 2016-09-04: qty 15

## 2016-09-04 MED ORDER — EPHEDRINE 5 MG/ML INJ
10.0000 mg | INTRAVENOUS | Status: DC | PRN
Start: 2016-09-04 — End: 2016-09-05
  Filled 2016-09-04: qty 2

## 2016-09-04 NOTE — Anesthesia Procedure Notes (Signed)
Epidural Patient location during procedure: OB Start time: 09/04/2016 8:49 PM End time: 09/04/2016 9:00 PM  Staffing Anesthesiologist: Ellicia Alix Performed: anesthesiologist   Preanesthetic Checklist Completed: patient identified, site marked, pre-op evaluation, timeout performed, IV checked, risks and benefits discussed and monitors and equipment checked  Epidural Patient position: sitting Prep: DuraPrep Patient monitoring: heart rate, cardiac monitor, continuous pulse ox and blood pressure Approach: midline Location: L2-L3 Injection technique: LOR saline  Needle:  Needle type: Tuohy  Needle gauge: 17 G Needle length: 9 cm Needle insertion depth: 6 cm Catheter type: closed end flexible Catheter size: 19 Gauge Catheter at skin depth: 11 cm Test dose: negative and Other  Assessment Events: blood not aspirated, injection not painful, no injection resistance and negative IV test  Additional Notes Informed consent obtained prior to proceeding including risk of failure, 1% risk of PDPH, risk of minor discomfort and bruising.  Discussed rare but serious complications including epidural abscess, permanent nerve injury, epidural hematoma.  Discussed alternatives to epidural analgesia and patient desires to proceed.  Timeout performed pre-procedure verifying patient name, procedure, and platelet count.  Patient tolerated procedure well. Reason for block:procedure for pain

## 2016-09-04 NOTE — Progress Notes (Signed)
Patient ID: [name], female   DOB: [birth year], 31 y.o.   MRN: [id number] Called to see patient Somnolent but alert and oriented  Vitals:   09/04/16 2105 09/04/16 2111 09/04/16 2116 09/04/16 2126  BP: (!) 147/91 140/77 128/62 (!) 91/55  Pulse: (!) 107 (!) 122 (!) 119 (!) 140  Resp: 18 18 18 18   Temp:      TempSrc:      SpO2:      Weight:      Height:       HR elevated but BP down, suspect hypovolemia  Will give fluid bolus  Mag Sulfate turned off  Stat Mag level drawn  Will reevaluate shortly

## 2016-09-04 NOTE — H&P (Signed)
[name] is a 31 y.o. female presenting for augmentation of labor. Advanced dilatation while in office today. OB History    Gravida Para Term Preterm AB Living   6 4 3 1 1 3    SAB TAB Ectopic Multiple Live Births   0 0 1 0 3     Past Medical History:  Diagnosis Date  . Asthma   . Depression    Postpartum after 1st pregnancy  . Hemorrhoids   . HIV positive (HCC)    Past Surgical History:  Procedure Laterality Date  . DILATION AND CURETTAGE OF UTERUS    . LAPAROSCOPY  01/06/2011   Procedure: LAPAROSCOPY OPERATIVE;  Surgeon: Roseanna Rainbow, MD;  Location: [facility];  Service: Gynecology;  Laterality: N/A;  . LAPAROTOMY  01/06/2011   Procedure: LAPAROTOMY;  Surgeon: Roseanna Rainbow, MD;  Location: [facility];  Service: Gynecology;  Laterality: N/A;  . SALPINGOOPHORECTOMY     Family History: family history includes Asthma in her daughter; Diabetes in her brother, maternal aunt, and mother; Hyperlipidemia in her mother; Hypertension in her maternal aunt and mother; Kidney disease in her mother. Social History:  reports that she has been smoking Cigarettes.  She has a 3.50 pack-year smoking history. She uses smokeless tobacco. She reports that she does not drink alcohol or use drugs.     Maternal Diabetes: No Genetic Screening: Declined Maternal Ultrasounds/Referrals: Normal Fetal Ultrasounds or other Referrals:  None Maternal Substance Abuse:  No Significant Maternal Medications:  Meds include: Other: Epzicom and Prezista Significant Maternal Lab Results:  Lab values include: Group B Strep negative, HIV positive Other Comments:  None  Review of Systems  Constitutional: Negative.   HENT: Negative.   Eyes: Negative.   Respiratory: Negative.   Cardiovascular: Negative.   Gastrointestinal: Negative.   Genitourinary: Negative.   Musculoskeletal: Negative.   Skin: Negative.   Neurological: Positive for headaches.  Endo/Heme/Allergies: Negative.    Psychiatric/Behavioral: Negative.    Maternal Medical History:  Reason for admission: GHTN and Advanced dilation  Contractions: Frequency: irregular.   Perceived severity is mild.    Prenatal complications: HIV and PIH (new problem; h/o GHTn with prev preg).   Prenatal Complications - Diabetes: none.    Dilation: 4 Effacement (%): 50 Station: -3 Exam by:: Isair Inabinet CNM Blood pressure (!) 148/103, pulse (!) 103, temperature 98.3 F (36.8 C), temperature source Oral, resp. rate 16, height 5\' 2"  (1.575 m), weight 99.3 kg (219 lb), last menstrual period 12/23/2015, SpO2 97 %. Maternal Exam:  Uterine Assessment: Contraction strength is mild.  Contraction frequency is rare.   Abdomen: Patient reports no abdominal tenderness. Fetal presentation: vertex  Introitus: Normal vulva. Normal vagina.  Ferning test: not done.  Nitrazine test: not done. Amniotic fluid character: not assessed.  Pelvis: adequate for delivery.   Cervix: Cervix evaluated by digital exam.     Physical Exam  Constitutional: She is oriented to person, place, and time. She appears well-developed and well-nourished.  HENT:  Head: Normocephalic.  Eyes: Pupils are equal, round, and reactive to light.  Neck: Normal range of motion.  Cardiovascular: Normal rate, regular rhythm, normal heart sounds and intact distal pulses.   Respiratory: Effort normal and breath sounds normal.  GI: Soft. Bowel sounds are normal.  Genitourinary: Vagina normal.  Genitourinary Comments: Uterus: gravid, S=D, cx; 4/50/-3/vtx    Musculoskeletal: Normal range of motion.  Neurological: She is alert and oriented to person, place, and time. She has normal reflexes.  Skin: Skin  is warm and dry.  Psychiatric: She has a normal mood and affect. Her behavior is normal. Judgment and thought content normal.   Results for orders placed or performed during the hospital encounter of 09/04/16 (from the past 24 hour(s))  Urinalysis, Routine w  reflex microscopic     Status: Abnormal   Collection Time: 09/04/16  9:13 AM  Result Value Ref Range   Color, Urine YELLOW YELLOW   APPearance CLEAR CLEAR   Specific Gravity, Urine 1.017 1.005 - 1.030   pH 7.0 5.0 - 8.0   Glucose, UA NEGATIVE NEGATIVE mg/dL   Hgb urine dipstick NEGATIVE NEGATIVE   Bilirubin Urine NEGATIVE NEGATIVE   Ketones, ur NEGATIVE NEGATIVE mg/dL   Protein, ur NEGATIVE NEGATIVE mg/dL   Nitrite NEGATIVE NEGATIVE   Leukocytes, UA TRACE (A) NEGATIVE   RBC / HPF 0-5 0 - 5 RBC/hpf   WBC, UA 0-5 0 - 5 WBC/hpf   Bacteria, UA RARE (A) NONE SEEN   Squamous Epithelial / LPF 0-5 (A) NONE SEEN   Mucous PRESENT   Protein / creatinine ratio, urine     Status: Abnormal   Collection Time: 09/04/16  9:13 AM  Result Value Ref Range   Creatinine, Urine 117.00 mg/dL   Total Protein, Urine 20 mg/dL   Protein Creatinine Ratio 0.17 (H) 0.00 - 0.15 mg/mg[Cre]  CBC     Status: Abnormal   Collection Time: 09/04/16 10:02 AM  Result Value Ref Range   WBC 11.4 (H) 4.0 - 10.5 K/uL   RBC 3.79 (L) 3.87 - 5.11 MIL/uL   Hemoglobin 11.1 (L) 12.0 - 15.0 g/dL   HCT 40.934.5 (L) 81.136.0 - 91.446.0 %   MCV 91.0 78.0 - 100.0 fL   MCH 29.3 26.0 - 34.0 pg   MCHC 32.2 30.0 - 36.0 g/dL   RDW 78.214.5 95.611.5 - 21.315.5 %   Platelets 209 150 - 400 K/uL  Comprehensive metabolic panel     Status: Abnormal   Collection Time: 09/04/16 10:02 AM  Result Value Ref Range   Sodium 135 135 - 145 mmol/L   Potassium 3.8 3.5 - 5.1 mmol/L   Chloride 106 101 - 111 mmol/L   CO2 23 22 - 32 mmol/L   Glucose, Bld 88 65 - 99 mg/dL   BUN 10 6 - 20 mg/dL   Creatinine, Ser 0.860.52 0.44 - 1.00 mg/dL   Calcium 8.9 8.9 - 57.810.3 mg/dL   Total Protein 8.0 6.5 - 8.1 g/dL   Albumin 3.0 (L) 3.5 - 5.0 g/dL   AST 12 (L) 15 - 41 U/L   ALT 9 (L) 14 - 54 U/L   Alkaline Phosphatase 76 38 - 126 U/L   Total Bilirubin 0.5 0.3 - 1.2 mg/dL   GFR calc non Af Amer >60 >60 mL/min   GFR calc Af Amer >60 >60 mL/min   Anion gap 6 5 - 15   Prenatal  labs: ABO, Rh: O/POS/-- (01/10 1152) Antibody: NEG (01/10 1152) Rubella: 2.49 (01/10 1152) RPR: Non Reactive (04/17 0958)  HBsAg: NEGATIVE (01/10 1152)  HIV: REACTIVE (01/10 1152)  GBS: Negative (06/07 0000)   Assessment/Plan: Spontaneous onset of labor GHTN HIV (+)  Admit to L&D  Routine L&D orders AZT IV labor protocol Magnesium 4 gm bolus, then 2 gm/hr Preeclampsia Focus Protocol Pitocin 2 x 2 protocol; start after 1st round of AZT infused  *Consult with Dr. Jolayne Pantheronstant - agrees with plan  Raelyn Moraolitta Jashae Wiggs MSN, CNM 09/04/2016, 1:36 PM

## 2016-09-04 NOTE — Progress Notes (Addendum)
IOL scheduled for June 19th @ 0730.   Pt notified.

## 2016-09-04 NOTE — MAU Note (Signed)
Urine sent to lab 

## 2016-09-04 NOTE — Progress Notes (Signed)
   PRENATAL VISIT NOTE  Subjective:  [name] is a 31 y.o. [phone] at 5360w3d being seen today for ongoing prenatal care.  She is currently monitored for the following issues for this high-risk pregnancy and has Human immunodeficiency virus (HIV) disease (HCC); ASTHMA; CHLAMYDIAL INFECTION, HX OF; Obesity affecting pregnancy; Supervision of high risk pregnancy, antepartum; and History of preterm delivery, currently pregnant on her problem list.  Patient reports no complaints.  Contractions: Irregular. Vag. Bleeding: None.  Movement: Present. Denies leaking of fluid.   The following portions of the patient's history were reviewed and updated as appropriate: allergies, current medications, past family history, past medical history, past social history, past surgical history and problem list. Problem list updated.  Objective:   Vitals:   09/04/16 0808  BP: (!) 142/90  Pulse: 90  Weight: 220 lb (99.8 kg)    Fetal Status: Fetal Heart Rate (bpm): 145   Movement: Present     General:  Alert, oriented and cooperative. Patient is in no acute distress.  Skin: Skin is warm and dry. No rash noted.   Cardiovascular: Normal heart rate noted  Respiratory: Normal respiratory effort, no problems with respiration noted  Abdomen: Soft, gravid, appropriate for gestational age. Pain/Pressure: Present     Pelvic:  Cervical exam performed        Extremities: Normal range of motion.  Edema: None  Mental Status: Normal mood and affect. Normal behavior. Normal judgment and thought content.   Assessment and Plan:  Pregnancy: [id number] at 3660w3d  1. Supervision of high risk pregnancy, antepartum - Continue Routine Care  2. Human immunodeficiency virus (HIV) disease (HCC) - Induction scheduled for 3064w0d (6/19) - CD4% < 40 -07/03/16 - AZT during labor  Term labor symptoms and general obstetric precautions including but not limited to vaginal bleeding, contractions, leaking of fluid and fetal movement  were reviewed in detail with the patient. Please refer to After Visit Summary for other counseling recommendations.  No Follow-up on file.   Caryn BeeKeku, Cynthis Purington A, Medical Student

## 2016-09-04 NOTE — Anesthesia Pain Management Evaluation Note (Signed)
  CRNA Pain Management Visit Note  Patient: [name], 31 y.o., female  "Hello I am a member of the anesthesia team at [facility]. We have an anesthesia team available at all times to provide care throughout the hospital, including epidural management and anesthesia for C-section. I don't know your plan for the delivery whether it a natural birth, water birth, IV sedation, nitrous supplementation, doula or epidural, but we want to meet your pain goals."   1.Was your pain managed to your expectations on prior hospitalizations?   Yes   2.What is your expectation for pain management during this hospitalization?     Epidural  3.How can we help you reach that goal? yes  Record the patient's initial score and the patient's pain goal.   Pain: mild  Pain Goal: 0/10 [facility] wants you to be able to say your pain was always managed very well.  Salome ArntSterling, Doree Kuehne Marie 09/04/2016

## 2016-09-04 NOTE — Anesthesia Preprocedure Evaluation (Signed)
Anesthesia Evaluation  Patient identified by MRN, date of birth, ID band Patient awake    Reviewed: Allergy & Precautions, H&P , NPO status , Patient's Chart, lab work & pertinent test results  Airway Mallampati: II   Neck ROM: full    Dental   Pulmonary asthma , Current Smoker,    breath sounds clear to auscultation       Cardiovascular hypertension,  Rhythm:regular Rate:Normal     Neuro/Psych PSYCHIATRIC DISORDERS Depression    GI/Hepatic   Endo/Other  Morbid obesity  Renal/GU      Musculoskeletal   Abdominal   Peds  Hematology  (+) HIV,   Anesthesia Other Findings   Reproductive/Obstetrics (+) Pregnancy                             Anesthesia Physical Anesthesia Plan  ASA: II  Anesthesia Plan: Epidural   Post-op Pain Management:    Induction: Intravenous  PONV Risk Score and Plan: 1 and Treatment may vary due to age or medical condition  Airway Management Planned: Natural Airway  Additional Equipment:   Intra-op Plan:   Post-operative Plan:   Informed Consent: I have reviewed the patients History and Physical, chart, labs and discussed the procedure including the risks, benefits and alternatives for the proposed anesthesia with the patient or authorized representative who has indicated his/her understanding and acceptance.     Plan Discussed with: Anesthesiologist  Anesthesia Plan Comments:         Anesthesia Quick Evaluation

## 2016-09-04 NOTE — MAU Note (Addendum)
Pt was sent from the clinic for Pre-E eval from Dr Jolayne Pantheronstant. Pt was checked in the office and was 4cm. No LOF or bleeding but says she feels lots of pressure.

## 2016-09-04 NOTE — Progress Notes (Signed)
[name] is a 31 y.o. [id number] at 7469w3d by ultrasound admitted for induction of labor due to Hypertension.  Subjective:   Objective: BP (!) 142/95 (BP Location: Left Arm)   Pulse (!) 101   Temp 97.7 F (36.5 C) (Oral)   Resp 16   Ht 5\' 2"  (1.575 m)   Wt 99.3 kg (219 lb)   LMP 12/23/2015   SpO2 97%   BMI 40.06 kg/m  I/O last 3 completed shifts: In: 1824.3 [P.O.:1140; I.V.:564.4; IV Piggyback:119.9] Out: 1600 [Urine:1600]   FHT:  FHR: 135 bpm, variability: moderate,  accelerations:  Present,  decelerations:  Absent UC:   regular, every 2-3 minutes SVE:   Dilation: 5.5 Effacement (%): 80 Station: -3 Exam by:: CNM [employer]  Labs: Lab Results  Component Value Date   WBC 11.4 (H) 09/04/2016   HGB 11.1 (L) 09/04/2016   HCT 34.5 (L) 09/04/2016   MCV 91.0 09/04/2016   PLT 209 09/04/2016    Assessment / Plan: Induction of labor due to gestational hypertension,  progressing well on pitocin  Labor: Progressing on Pitocin, will continue to increase then AROM Preeclampsia:  on magnesium sulfate, no signs or symptoms of toxicity, intake and ouput balanced and labs stable Fetal Wellbeing:  Category I Pain Control:  Labor support without medications I/D:  n/a Anticipated MOD:  NSVD  Raelyn Moraolitta Mellisa Arshad, MSN, CNM 09/04/2016, 7:40 PM

## 2016-09-04 NOTE — Progress Notes (Signed)
   PRENATAL VISIT NOTE  Subjective:  [name] is a 31 y.o. [phone] at 2830w3d being seen today for ongoing prenatal care.  She is currently monitored for the following issues for this high-risk pregnancy and has Human immunodeficiency virus (HIV) disease (HCC); ASTHMA; CHLAMYDIAL INFECTION, HX OF; Obesity affecting pregnancy; Supervision of high risk pregnancy, antepartum; and History of preterm delivery, currently pregnant on her problem list.  Patient reports no complaints.  Contractions: Irregular. Vag. Bleeding: None.  Movement: Present. Denies leaking of fluid.   The following portions of the patient's history were reviewed and updated as appropriate: allergies, current medications, past family history, past medical history, past social history, past surgical history and problem list. Problem list updated.  Objective:   Vitals:   09/04/16 0808  BP: (!) 142/90  Pulse: 90  Weight: 99.8 kg (220 lb)    Fetal Status: Fetal Heart Rate (bpm): 145 Fundal Height: 39 cm Movement: Present  Presentation: Vertex  General:  Alert, oriented and cooperative. Patient is in no acute distress.  Skin: Skin is warm and dry. No rash noted.   Cardiovascular: Normal heart rate noted  Respiratory: Normal respiratory effort, no problems with respiration noted  Abdomen: Soft, gravid, appropriate for gestational age. Pain/Pressure: Present     Pelvic:  Cervical exam deferred Dilation: 4 Effacement (%): 30 Station: -2  Extremities: Normal range of motion.  Edema: None  Mental Status: Normal mood and affect. Normal behavior. Normal judgment and thought content.   Assessment and Plan:  Pregnancy: [id number] at 6930w3d  1. Supervision of high risk pregnancy, antepartum   2. Obesity affecting pregnancy in third trimester   3. Human immunodeficiency virus (HIV) disease (HCC) 4. Elevated BPs today -I spoke with Dr. Jolayne Pantheronstant who would like the patient evaluated in the MAU.  Term labor symptoms and  general obstetric precautions including but not limited to vaginal bleeding, contractions, leaking of fluid and fetal movement were reviewed in detail with the patient. Please refer to After Visit Summary for other counseling recommendations.  No Follow-up on file.   Mary BossierMyra C Shalese Strahan, MD

## 2016-09-04 NOTE — MAU Provider Note (Signed)
History     CSN: [id number]  Arrival date and time: 09/04/16 0909   None     Chief Complaint  Patient presents with  . Hypertension   HPI [name] is 31 y.o. [id number] [redacted]w[redacted]d weeks presenting with elevated blood pressures sent to MAU from the clinic. In the clinic cervical exam 4 cm.  Denies loss of fluid and vaginal bleeding.  Feeling a lot of pressure.  Monitor shows contractions q 2-4 minutes. She reports having elevated blood pressures with her last 3 pregnancies.  Hypertension resolved with deliveries and she did not take medications after delivery.  Today's BP readings are the first elevated ones with this pregnancy.  She denies chest/abdominal pain, visual disturbances.  Describes mild "tension" headache. Declines Tylenol at this time but states she make ask for it if sxs worsens.   Past Medical History:  Diagnosis Date  . Asthma   . Depression    Postpartum after 1st pregnancy  . Hemorrhoids   . HIV positive (HCC)     Past Surgical History:  Procedure Laterality Date  . DILATION AND CURETTAGE OF UTERUS    . LAPAROSCOPY  01/06/2011   Procedure: LAPAROSCOPY OPERATIVE;  Surgeon: Roseanna Rainbow, MD;  Location: [facility];  Service: Gynecology;  Laterality: N/A;  . LAPAROTOMY  01/06/2011   Procedure: LAPAROTOMY;  Surgeon: Roseanna Rainbow, MD;  Location: [facility];  Service: Gynecology;  Laterality: N/A;  . SALPINGOOPHORECTOMY      Family History  Problem Relation Age of Onset  . Diabetes Mother   . Hypertension Mother   . Hyperlipidemia Mother   . Kidney disease Mother   . Diabetes Brother   . Diabetes Maternal Aunt   . Hypertension Maternal Aunt   . Asthma Daughter     Social History  Substance Use Topics  . Smoking status: Current Every Day Smoker    Packs/day: 0.50    Years: 7.00    Types: Cigarettes    Last attempt to quit: 03/29/2011  . Smokeless tobacco: Current User  . Alcohol use No    Allergies:  Allergies  Allergen Reactions  .  Benadryl [Diphenhydramine] Itching    Prescriptions Prior to Admission  Medication Sig Dispense Refill Last Dose  . abacavir-lamiVUDine (EPZICOM) 600-300 MG tablet Take 1 tablet by mouth daily.   09/03/2016 at Unknown time  . darunavir (PREZISTA) 600 MG tablet Take 600 mg by mouth 2 (two) times daily with a meal.   09/03/2016 at Unknown time  . docusate sodium (COLACE) 100 MG capsule Take 1 capsule (100 mg total) by mouth 2 (two) times daily as needed. (Patient taking differently: Take 100 mg by mouth 2 (two) times daily as needed for moderate constipation. ) 30 capsule 2 Past Month at Unknown time  . ferrous sulfate 325 (65 FE) MG tablet Take 1 tablet (325 mg total) by mouth daily. 30 tablet 3 Past Week at Unknown time  . Prenatal Vit-Fe Fumarate-FA (PREPLUS) 27-1 MG TABS Take 1 tablet by mouth daily. 30 tablet 13 09/03/2016 at Unknown time  . ritonavir (NORVIR) 100 MG TABS tablet Take 100 mg by mouth 2 (two) times daily.   09/03/2016 at Unknown time    Review of Systems  Constitutional: Negative for fever.  Eyes: Negative for visual disturbance.  Respiratory: Negative for chest tightness.   Cardiovascular: Negative for chest pain.  Gastrointestinal: Abdominal pain: contractions-mild Neg for upper abdominal pain.  Genitourinary: Negative for vaginal bleeding and vaginal discharge.  Musculoskeletal: Negative  for back pain.  Neurological: Positive for headaches (mild "tension" h/a). Negative for dizziness.   Physical Exam   Blood pressure (!) 144/92, pulse 98, temperature 98.2 F (36.8 C), temperature source Oral, resp. rate 18, last menstrual period 12/23/2015.  Physical Exam  Constitutional: She is oriented to person, place, and time. She appears well-developed and well-nourished. No distress.  HENT:  Head: Normocephalic.  Neck: Normal range of motion.  Cardiovascular: Normal rate.   Respiratory: Effort normal.  Genitourinary:  Genitourinary Comments: Cervical exam by Fleet Contrasachel RN--4cm  dilated, 50% effaced at -3 station.    Neurological: She is alert and oriented to person, place, and time.  Skin: Skin is warm and dry.  Psychiatric: She has a normal mood and affect. Her behavior is normal. Thought content normal.   Results for orders placed or performed during the hospital encounter of 09/04/16 (from the past 24 hour(s))  Urinalysis, Routine w reflex microscopic     Status: Abnormal   Collection Time: 09/04/16  9:13 AM  Result Value Ref Range   Color, Urine YELLOW YELLOW   APPearance CLEAR CLEAR   Specific Gravity, Urine 1.017 1.005 - 1.030   pH 7.0 5.0 - 8.0   Glucose, UA NEGATIVE NEGATIVE mg/dL   Hgb urine dipstick NEGATIVE NEGATIVE   Bilirubin Urine NEGATIVE NEGATIVE   Ketones, ur NEGATIVE NEGATIVE mg/dL   Protein, ur NEGATIVE NEGATIVE mg/dL   Nitrite NEGATIVE NEGATIVE   Leukocytes, UA TRACE (A) NEGATIVE   RBC / HPF 0-5 0 - 5 RBC/hpf   WBC, UA 0-5 0 - 5 WBC/hpf   Bacteria, UA RARE (A) NONE SEEN   Squamous Epithelial / LPF 0-5 (A) NONE SEEN   Mucous PRESENT   Protein / creatinine ratio, urine     Status: Abnormal   Collection Time: 09/04/16  9:13 AM  Result Value Ref Range   Creatinine, Urine 117.00 mg/dL   Total Protein, Urine 20 mg/dL   Protein Creatinine Ratio 0.17 (H) 0.00 - 0.15 mg/mg[Cre]  CBC     Status: Abnormal   Collection Time: 09/04/16 10:02 AM  Result Value Ref Range   WBC 11.4 (H) 4.0 - 10.5 K/uL   RBC 3.79 (L) 3.87 - 5.11 MIL/uL   Hemoglobin 11.1 (L) 12.0 - 15.0 g/dL   HCT 82.934.5 (L) 56.236.0 - 13.046.0 %   MCV 91.0 78.0 - 100.0 fL   MCH 29.3 26.0 - 34.0 pg   MCHC 32.2 30.0 - 36.0 g/dL   RDW 86.514.5 78.411.5 - 69.615.5 %   Platelets 209 150 - 400 K/uL  Comprehensive metabolic panel     Status: Abnormal   Collection Time: 09/04/16 10:02 AM  Result Value Ref Range   Sodium 135 135 - 145 mmol/L   Potassium 3.8 3.5 - 5.1 mmol/L   Chloride 106 101 - 111 mmol/L   CO2 23 22 - 32 mmol/L   Glucose, Bld 88 65 - 99 mg/dL   BUN 10 6 - 20 mg/dL   Creatinine, Ser  2.950.52 0.44 - 1.00 mg/dL   Calcium 8.9 8.9 - 28.410.3 mg/dL   Total Protein 8.0 6.5 - 8.1 g/dL   Albumin 3.0 (L) 3.5 - 5.0 g/dL   AST 12 (L) 15 - 41 U/L   ALT 9 (L) 14 - 54 U/L   Alkaline Phosphatase 76 38 - 126 U/L   Total Bilirubin 0.5 0.3 - 1.2 mg/dL   GFR calc non Af Amer >60 >60 mL/min   GFR calc Af Amer >60 >60  mL/min   Anion gap 6 5 - 15    9:04 BP 160/94  Today's Vitals   09/04/16 1048 09/04/16 1101 09/04/16 1116 09/04/16 1132  BP: (!) 139/93 (!) 137/99 (!) 136/92 (!) 144/92  Pulse: (!) 102 (!) 107 95 98  Resp:      Temp:      TempSrc:      PainSc:        NST:  145  BPM            Mod Variabilty            15X15s           Contraction q 2-4 minutes MAU Course  Procedures  MDM MSE Exam Labs Discussed with Dr. Jolayne Panther .  Order given to Admit  Report given to Denton Meek, CNM  ASSESSMENT AND PLAN  A: Gestational Hypertension at [redacted]w[redacted]d gestation       P: ADMIT  Eve M Key 09/04/2016, 11:36 AM

## 2016-09-05 ENCOUNTER — Encounter (HOSPITAL_COMMUNITY): Payer: Self-pay

## 2016-09-05 DIAGNOSIS — Z3A38 38 weeks gestation of pregnancy: Secondary | ICD-10-CM

## 2016-09-05 DIAGNOSIS — O9872 Human immunodeficiency virus [HIV] disease complicating childbirth: Secondary | ICD-10-CM

## 2016-09-05 DIAGNOSIS — Z21 Asymptomatic human immunodeficiency virus [HIV] infection status: Secondary | ICD-10-CM

## 2016-09-05 DIAGNOSIS — O134 Gestational [pregnancy-induced] hypertension without significant proteinuria, complicating childbirth: Secondary | ICD-10-CM

## 2016-09-05 LAB — RPR: RPR Ser Ql: NONREACTIVE

## 2016-09-05 MED ORDER — MAGNESIUM SULFATE 40 G IN LACTATED RINGERS - SIMPLE
2.0000 g/h | INTRAVENOUS | Status: AC
  Administered 2016-09-05: 2 g/h via INTRAVENOUS
  Filled 2016-09-05: qty 40

## 2016-09-05 MED ORDER — WITCH HAZEL-GLYCERIN EX PADS
1.0000 "application " | MEDICATED_PAD | CUTANEOUS | Status: DC | PRN
  Administered 2016-09-06: 1 via TOPICAL

## 2016-09-05 MED ORDER — LACTATED RINGERS IV SOLN
INTRAVENOUS | Status: DC
  Administered 2016-09-05 (×2): via INTRAVENOUS

## 2016-09-05 MED ORDER — TETANUS-DIPHTH-ACELL PERTUSSIS 5-2.5-18.5 LF-MCG/0.5 IM SUSP: 0.5000 mL | Freq: Once | INTRAMUSCULAR | Status: DC

## 2016-09-05 MED ORDER — ONDANSETRON HCL 4 MG PO TABS
4.0000 mg | ORAL_TABLET | ORAL | Status: DC | PRN
Start: 2016-09-05 — End: 2016-09-07

## 2016-09-05 MED ORDER — ZOLPIDEM TARTRATE 5 MG PO TABS: 5.0000 mg | ORAL_TABLET | Freq: Every evening | ORAL | Status: DC | PRN

## 2016-09-05 MED ORDER — PRENATAL MULTIVITAMIN CH
1.0000 | ORAL_TABLET | Freq: Every day | ORAL | Status: DC
  Administered 2016-09-05 – 2016-09-07 (×2): 1 via ORAL
  Filled 2016-09-05 (×2): qty 1

## 2016-09-05 MED ORDER — DIBUCAINE 1 % RE OINT
1.0000 "application " | TOPICAL_OINTMENT | RECTAL | Status: DC | PRN
  Administered 2016-09-06: 1 via RECTAL
  Filled 2016-09-05: qty 28

## 2016-09-05 MED ORDER — BENZOCAINE-MENTHOL 20-0.5 % EX AERO: 1.0000 "application " | INHALATION_SPRAY | CUTANEOUS | Status: DC | PRN

## 2016-09-05 MED ORDER — COCONUT OIL OIL
1.0000 | TOPICAL_OIL | Status: DC | PRN
Start: 2016-09-05 — End: 2016-09-07

## 2016-09-05 MED ORDER — SENNOSIDES-DOCUSATE SODIUM 8.6-50 MG PO TABS
2.0000 | ORAL_TABLET | ORAL | Status: DC
  Administered 2016-09-06: 2 via ORAL
  Filled 2016-09-05: qty 2

## 2016-09-05 MED ORDER — ONDANSETRON HCL 4 MG/2ML IJ SOLN: 4.0000 mg | INTRAMUSCULAR | Status: DC | PRN

## 2016-09-05 MED ORDER — IBUPROFEN 600 MG PO TABS
600.0000 mg | ORAL_TABLET | Freq: Four times a day (QID) | ORAL | Status: DC
Start: 2016-09-05 — End: 2016-09-06
  Administered 2016-09-05 – 2016-09-06 (×3): 600 mg via ORAL
  Filled 2016-09-05 (×4): qty 1

## 2016-09-05 MED ORDER — SIMETHICONE 80 MG PO CHEW: 80.0000 mg | CHEWABLE_TABLET | ORAL | Status: DC | PRN

## 2016-09-05 MED ORDER — ACETAMINOPHEN 325 MG PO TABS
650.0000 mg | ORAL_TABLET | ORAL | Status: DC | PRN
  Administered 2016-09-06 – 2016-09-07 (×2): 650 mg via ORAL
  Filled 2016-09-05 (×2): qty 2

## 2016-09-05 NOTE — Anesthesia Postprocedure Evaluation (Signed)
Anesthesia Post Note  Patient: Mary BoxSheneka S Jewel  Procedure(s) Performed: * No procedures listed *     Patient location during evaluation: Mother Baby Anesthesia Type: Epidural Level of consciousness: awake and alert Pain management: pain level controlled Vital Signs Assessment: post-procedure vital signs reviewed and stable Respiratory status: spontaneous breathing, nonlabored ventilation and respiratory function stable Cardiovascular status: stable Postop Assessment: no headache, no backache, epidural receding and patient able to bend at knees Anesthetic complications: no    Last Vitals:  Vitals:   09/05/16 0725 09/05/16 0753  BP:  131/90  Pulse:  90  Resp: 18 18  Temp:  36.7 C    Last Pain:  Vitals:   09/05/16 0753  TempSrc: Oral  PainSc:    Pain Goal: Patients Stated Pain Goal: 0 (09/05/16 0400)               Rica RecordsICKELTON,Sharvi Mooneyhan

## 2016-09-06 LAB — CBC
HEMATOCRIT: 26.9 % — AB (ref 36.0–46.0)
HEMOGLOBIN: 8.9 g/dL — AB (ref 12.0–15.0)
MCH: 30.4 pg (ref 26.0–34.0)
MCHC: 33.1 g/dL (ref 30.0–36.0)
MCV: 91.8 fL (ref 78.0–100.0)
Platelets: 192 10*3/uL (ref 150–400)
RBC: 2.93 MIL/uL — ABNORMAL LOW (ref 3.87–5.11)
RDW: 15 % (ref 11.5–15.5)
WBC: 10.7 10*3/uL — AB (ref 4.0–10.5)

## 2016-09-06 MED ORDER — RITONAVIR 100 MG PO TABS
100.0000 mg | ORAL_TABLET | Freq: Two times a day (BID) | ORAL | Status: DC
  Administered 2016-09-06 – 2016-09-07 (×3): 100 mg via ORAL
  Filled 2016-09-06 (×5): qty 1

## 2016-09-06 MED ORDER — SENNOSIDES-DOCUSATE SODIUM 8.6-50 MG PO TABS: 2.0000 | ORAL_TABLET | Freq: Every evening | ORAL | Status: DC | PRN

## 2016-09-06 MED ORDER — IBUPROFEN 600 MG PO TABS
600.0000 mg | ORAL_TABLET | Freq: Four times a day (QID) | ORAL | Status: DC | PRN
  Administered 2016-09-06 – 2016-09-07 (×3): 600 mg via ORAL
  Filled 2016-09-06 (×2): qty 1

## 2016-09-06 MED ORDER — ABACAVIR SULFATE-LAMIVUDINE 600-300 MG PO TABS
1.0000 | ORAL_TABLET | Freq: Every day | ORAL | Status: DC
  Filled 2016-09-06: qty 1

## 2016-09-06 MED ORDER — DARUNAVIR ETHANOLATE 600 MG PO TABS
600.0000 mg | ORAL_TABLET | Freq: Two times a day (BID) | ORAL | Status: DC
  Administered 2016-09-06 – 2016-09-07 (×3): 600 mg via ORAL
  Filled 2016-09-06 (×5): qty 1

## 2016-09-06 MED ORDER — ABACAVIR SULFATE 300 MG PO TABS
600.0000 mg | ORAL_TABLET | Freq: Every day | ORAL | Status: DC
  Administered 2016-09-06 – 2016-09-07 (×2): 600 mg via ORAL
  Filled 2016-09-06 (×4): qty 2

## 2016-09-06 MED ORDER — LAMIVUDINE 150 MG PO TABS
300.0000 mg | ORAL_TABLET | Freq: Every day | ORAL | Status: DC
  Administered 2016-09-06 – 2016-09-07 (×2): 300 mg via ORAL
  Filled 2016-09-06 (×3): qty 2

## 2016-09-06 NOTE — Progress Notes (Signed)
CSW met with MOB at bedside to discuss ID clinic follow-up for baby. CSW informed MOB of the options of [place] ID clinic or [facility] ID clinic. MOB decided she would like follow-up at [facility]. This writer completed referral and faxed it to [facility] ID clinic intake social worker Roslynn Amble. Additionally, CSW provided MOB with [facility] ID clinic welcome packet. At this time, MOB expressed no other concerns and verbalized understanding.   [name], MSW, LCSW-A Clinical Social Worker  [facility]  Office: [phone]

## 2016-09-06 NOTE — Progress Notes (Signed)
Daily Postpartum Note  Admission Date: 09/04/2016 Current Date: 09/06/2016 7:33 AM  [name] is a 31 y.o. [id number], POD#1 VAVD/intact perineum.  Pregnancy complicated by: HIV, gHTN-->severe pre-x, asthma, h/o PP depression, h/o STI, obesity  Overnight/24hr events:  Came off Mg early this AM  Subjective:  No s/s of pre-x. States she is passing large clots  Objective:    Current Vital Signs 24h Vital Sign Ranges  T 98.8 F (37.1 C) Temp  Avg: 98.2 F (36.8 C)  Min: 97.8 F (36.6 C)  Max: 98.8 F (37.1 C)  BP 127/88 BP  Min: 115/95  Max: 140/86  HR 90 Pulse  Avg: 98.2  Min: 90  Max: 109  RR 16 Resp  Avg: 18  Min: 16  Max: 20  SaO2 100 % Not Delivered SpO2  Avg: 99.3 %  Min: 96 %  Max: 100 %       24 Hour I/O Current Shift I/O  Time Ins Outs 06/16 0701 - 06/17 0700 In: 4292.9 [P.O.:2000; I.V.:2292.9] Out: 3400 [Urine:3400] No intake/output data recorded.   Patient Vitals for the past 24 hrs:  BP Temp Temp src Pulse Resp SpO2  09/06/16 0436 127/88 98.8 F (37.1 C) Oral 90 16 100 %  09/06/16 0026 (!) 115/95 98.1 F (36.7 C) Oral 98 - 100 %  09/05/16 2300 - - - - 20 -  09/05/16 2200 - - - - 18 -  09/05/16 2100 - - - - 18 -  09/05/16 2020 140/86 98.1 F (36.7 C) Oral (!) 109 18 100 %  09/05/16 1705 131/66 97.8 F (36.6 C) Oral (!) 103 18 96 %  09/05/16 1217 126/79 98.1 F (36.7 C) Oral 99 18 100 %  09/05/16 1030 - - - - 18 -  09/05/16 0753 131/90 98.1 F (36.7 C) Oral 90 18 100 %    Physical exam: General: Well nourished, well developed female in no acute distress. Abdomen: FF below the umbilicus, nttp Cardiovascular: S1, S2 normal, no murmur, rub or gallop, regular rate and rhythm Respiratory: CTAB Extremities: no clubbing, cyanosis or edema Skin: Warm and dry.   Medications: Current Facility-Administered Medications  Medication Dose Route Frequency Provider Last Rate Last Dose  . abacavir (ZIAGEN) tablet 600 mg  600 mg Oral Daily Constant, Peggy, MD        And  . lamiVUDine (EPIVIR) tablet 300 mg  300 mg Oral Daily Constant, Peggy, MD      . acetaminophen (TYLENOL) tablet 650 mg  650 mg Oral Q4H PRN Aviva Signs, CNM      . benzocaine-Menthol (DERMOPLAST) 20-0.5 % topical spray 1 application  1 application Topical PRN Aviva Signs, CNM      . coconut oil  1 application Topical PRN Aviva Signs, CNM      . darunavir (PREZISTA) tablet 600 mg  600 mg Oral BID WC [place] Bing, MD      . witch hazel-glycerin (TUCKS) pad 1 application  1 application Topical PRN Aviva Signs, CNM   1 application at 09/06/16 0458   And  . dibucaine (NUPERCAINAL) 1 % rectal ointment 1 application  1 application Rectal PRN Aviva Signs, CNM   1 application at 09/06/16 0458  . ibuprofen (ADVIL,MOTRIN) tablet 600 mg  600 mg Oral Q6H PRN [place] Bing, MD   600 mg at 09/06/16 0615  . ondansetron (ZOFRAN) tablet 4 mg  4 mg Oral Q4H PRN Aviva Signs, CNM      .  prenatal multivitamin tablet 1 tablet  1 tablet Oral Q1200 Williams, Marie L, CNM   1 tablet at 09/05/16 1344  . ritonavir (NORVIR) tablet 100 mg  100 mg Oral BID WC [place] BingPickens, CharAviva Signslie, MD      . senna-docusate (Senokot-S) tablet 2 tablet  2 tablet Oral QHS PRN [place] BingPickens, Syeda Prickett, MD      . simethicone (MYLICON) chewable tablet 80 mg  80 mg Oral PRN Aviva SignsWilliams, Marie L, CNM      . Tdap (BOOSTRIX) injection 0.5 mL  0.5 mL Intramuscular Once Aviva SignsWilliams, Marie L, CNM      . zolpidem (AMBIEN) tablet 5 mg  5 mg Oral QHS PRN Aviva SignsWilliams, Marie L, CNM        Labs:   Recent Labs Lab 09/04/16 1002 09/04/16 2024  WBC 11.4* 9.8  HGB 11.1* 11.3*  HCT 34.5* 34.9*  PLT 209 221     Recent Labs Lab 09/04/16 1002  NA 135  K 3.8  CL 106  CO2 23  BUN 10  CREATININE 0.52  CALCIUM 8.9  PROT 8.0  BILITOT 0.5  ALKPHOS 76  ALT 9*  AST 12*  GLUCOSE 88     Radiology: no new imaging  Assessment & Plan:  Pt doing well *Postpartum: routine care. RN states clots were small. VS normal and  stable; rpt CBC ordered -nexplanon, O POS, formula feeding.  *HIV: pre delivery regimen re ordered. Pt aware to call ID next week to let them know she delivered *Severe pre-x: baby love 1wk BP check ordered *PPx: SCDs, OOB ad lib *FEN/GI: d/c IV after CBC.  *Dispo: likely tomorrow.   Cornelia Copaharlie Santanna Olenik, Jr. MD Attending Center for [facility](Faculty Practice)

## 2016-09-07 ENCOUNTER — Encounter: Payer: Medicaid Other | Admitting: Obstetrics & Gynecology

## 2016-09-07 DIAGNOSIS — O1410 Severe pre-eclampsia, unspecified trimester: Secondary | ICD-10-CM

## 2016-09-07 DIAGNOSIS — Z8759 Personal history of other complications of pregnancy, childbirth and the puerperium: Secondary | ICD-10-CM

## 2016-09-07 MED ORDER — AMLODIPINE BESYLATE 5 MG PO TABS: 5.0000 mg | ORAL_TABLET | Freq: Every day | ORAL | 0 refills | Status: DC

## 2016-09-07 MED ORDER — IBUPROFEN 600 MG PO TABS: 600.0000 mg | ORAL_TABLET | Freq: Four times a day (QID) | ORAL | 0 refills | Status: DC | PRN

## 2016-09-07 MED ORDER — AMLODIPINE BESYLATE 5 MG PO TABS
5.0000 mg | ORAL_TABLET | Freq: Every day | ORAL | Status: DC
  Administered 2016-09-07: 5 mg via ORAL
  Filled 2016-09-07: qty 1

## 2016-09-07 NOTE — Progress Notes (Signed)
Patient was given discharge instructions on self and baby. Pre-E s/s reviewed, f/u appointment for 6 days, and medication administration reviewed. Pt verbalizes understanding. Infant discharge info reviewed with mother and both infant and mother discharged in good condition, mother verbalized understanding of the amount of medication to give baby and the correct frequency. Upon discharge, FOB was placing infant in the car front-facing and when [name] NT instructed that the seat was required to be rear-facing, the FOB slammed the door and left with infant forward-facing.

## 2016-09-07 NOTE — Discharge Instructions (Signed)

## 2016-09-07 NOTE — Discharge Summary (Signed)
OB Discharge Summary     Patient Name: [name] DOB: 1986-01-30 MRN: [id number]  Date of admission: 09/04/2016 Delivering MD: Aviva Signs   Date of discharge: 09/07/2016  Admitting diagnosis: 38WKS,ELEVATED BP Intrauterine pregnancy: [redacted]w[redacted]d     Secondary diagnosis:  Active Problems:   Human immunodeficiency virus (HIV) disease (HCC)   Gestational hypertension w/o significant proteinuria in 3rd trimester   Status post vacuum-assisted vaginal delivery   Severe preeclampsia  Additional problems:  Patient Active Problem List   Diagnosis Date Noted  . Status post vacuum-assisted vaginal delivery 09/07/2016  . Severe preeclampsia 09/07/2016  . Gestational hypertension w/o significant proteinuria in 3rd trimester 09/04/2016  . Supervision of high risk pregnancy, antepartum 06/08/2016  . History of preterm delivery, currently pregnant 06/08/2016  . Obesity affecting pregnancy 04/01/2016  . ASTHMA 07/08/2009  . CHLAMYDIAL INFECTION, HX OF 07/08/2009  . Human immunodeficiency virus (HIV) disease (HCC) 06/04/2009      Augmentation: AROM and Pitocin  Complications: severe pre-eclampsia, s/p magnesium. vacuum-assisted delivery  Hospital course:  Onset of Labor With Vaginal Delivery     31 y.o. yo [id number] at [redacted]w[redacted]d was admitted in labor on 09/04/2016. Patient had an uncomplicated labor course as follows:  Membrane Rupture Time/Date: 1:39 AM ,09/05/2016  Vacuum assisted vaginal delivery Intrapartum Procedures: Episiotomy: None [1]                                         Lacerations:  None [1]  Patient had a delivery of a Viable infant. 09/05/2016  Information for the patient's newborn:  [name] [[id number]]  Delivery Method: Vag-Spont    Pateint was placed on magnesium for 24 hours for severe pre-eclampsia. Blood pressures have been fluctuating off of magnesium, will send home with Norvasc 5mg . She is ambulating, tolerating a regular diet, passing flatus, and  urinating well. Patient had an undetectable viral load for her HIV, has been continued on her anti-viral medication and will continue on discharge as was previously prescribed. Spoke with Dr. Sallyanne Kuster (ID) office today, they have her scheduled for follow up on 09/25/16 at 9:30AM in their office for postpartum care for her HIV. Patient is discharged home in stable condition on 09/07/16.   Physical exam  Vitals:   09/07/16 0330 09/07/16 0750 09/07/16 0825 09/07/16 0955  BP: (!) 141/91 (!) 152/94 122/70 (!) 141/89  Pulse: 81 79 70 89  Resp: 18 18  17   Temp: 98.7 F (37.1 C) 97.5 F (36.4 C)    TempSrc: Oral Oral    SpO2: 100% 99%    Weight:      Height:       General: alert, cooperative and no distress Lochia: appropriate Uterine Fundus: firm DVT Evaluation: No evidence of DVT seen on physical exam. Labs: Lab Results  Component Value Date   WBC 10.7 (H) 09/06/2016   HGB 8.9 (L) 09/06/2016   HCT 26.9 (L) 09/06/2016   MCV 91.8 09/06/2016   PLT 192 09/06/2016   CMP Latest Ref Rng & Units 09/04/2016  Glucose 65 - 99 mg/dL 88  BUN 6 - 20 mg/dL 10  Creatinine 6.96 - 2.95 mg/dL 2.84  Sodium 132 - 440 mmol/L 135  Potassium 3.5 - 5.1 mmol/L 3.8  Chloride 101 - 111 mmol/L 106  CO2 22 - 32 mmol/L 23  Calcium 8.9 - 10.3 mg/dL 8.9  Total Protein 6.5 -  8.1 g/dL 8.0  Total Bilirubin 0.3 - 1.2 mg/dL 0.5  Alkaline Phos 38 - 126 U/L 76  AST 15 - 41 U/L 12(L)  ALT 14 - 54 U/L 9(L)    Discharge instruction: per After Visit Summary and "Baby and Me Booklet".  After visit meds:  Allergies as of 09/07/2016      Reactions   Benadryl [diphenhydramine] Itching      Medication List    TAKE these medications   abacavir-lamiVUDine 600-300 MG tablet Commonly known as:  EPZICOM Take 1 tablet by mouth daily.   amLODipine 5 MG tablet Commonly known as:  NORVASC Take 1 tablet (5 mg total) by mouth daily.   darunavir 600 MG tablet Commonly known as:  PREZISTA Take 600 mg by mouth 2 (two)  times daily with a meal.   docusate sodium 100 MG capsule Commonly known as:  COLACE Take 1 capsule (100 mg total) by mouth 2 (two) times daily as needed. What changed:  reasons to take this   ferrous sulfate 325 (65 FE) MG tablet Take 1 tablet (325 mg total) by mouth daily.   ibuprofen 600 MG tablet Commonly known as:  ADVIL,MOTRIN Take 1 tablet (600 mg total) by mouth every 6 (six) hours as needed for headache, mild pain or cramping.   PREPLUS 27-1 MG Tabs Take 1 tablet by mouth daily.   ritonavir 100 MG Tabs tablet Commonly known as:  NORVIR Take 100 mg by mouth 2 (two) times daily.       Diet: routine diet  Activity: Advance as tolerated. Pelvic rest for 6 weeks.   Outpatient follow up: [facility] for BP Follow up Appt:No future appointments. Follow up Visit:No Follow-up on file.  Postpartum contraception: Nexplanon  Newborn Data: Live born female  Birth Weight: 7 lb 4.9 oz (3314 g) APGAR: 7, 9  Baby Feeding: Bottle Disposition:home with mother   - Patient with borderline elevated pressures 130s/90s with a few elevated in 140-150s/80-90s and so was started on 5 mg norvasc prior to discharge. No symptoms of preeclampsia.  09/07/2016 Mary MowElizabeth Tonyia Marschall, DO   OB FELLOW DISCHARGE ATTESTATION  I have seen and examined this patient and agree with above documentation in the resident's note. Patient does not have any signs or symptoms of preeclampsia. Discussed when to return to ED due to these symptoms, and that baby love RN will come to the house to check her BPs. Also educated on patient starting a BP medication for the postpartum period and will reevaluate at future visit for continuation. Spoke with Dr. Don BroachSeller's office (infectious disease), agree to continue current HIV anti-viral medication regimen, and to follow up with an office visit on 09/25/16 at 9:30AM, in discharge paperwork.  Baby has an [facility] pediatrician she will follow with after discharge. Stable for  discharge.  Mary MowElizabeth Nicholi Ghuman, DO OB Fellow 10:14 AM

## 2016-09-08 ENCOUNTER — Inpatient Hospital Stay (HOSPITAL_COMMUNITY): Payer: Medicaid Other

## 2016-09-28 ENCOUNTER — Other Ambulatory Visit: Payer: Self-pay

## 2016-09-28 MED ORDER — AMLODIPINE BESYLATE 5 MG PO TABS: 5.0000 mg | ORAL_TABLET | Freq: Every day | ORAL | 0 refills | Status: DC

## 2016-09-28 NOTE — Telephone Encounter (Signed)
Patient was never sent home with Norvasc 5 mg after discharge. Per Dr.Mumaw patient can have this called in.

## 2016-10-14 ENCOUNTER — Telehealth: Payer: Self-pay | Admitting: *Deleted

## 2016-10-14 NOTE — Telephone Encounter (Signed)
Patient called and stated that she started her period and she is bleeding heavy and saturating her pads in less than 1 hour. She is wearing two pads at a time. She has large blood clots coming out. I advised patient to come to MAU for evaluation of bleeding. Patient is agreeable to this.

## 2016-10-15 ENCOUNTER — Inpatient Hospital Stay (HOSPITAL_COMMUNITY)
Admission: AD | Admit: 2016-10-15 | Discharge: 2016-10-15 | Disposition: A | Discharge status: Home / Self Care | Payer: Medicaid Other | Source: Ambulatory Visit | Attending: Obstetrics and Gynecology | Admitting: Obstetrics and Gynecology

## 2016-10-15 DIAGNOSIS — O99345 Other mental disorders complicating the puerperium: Secondary | ICD-10-CM | POA: Insufficient documentation

## 2016-10-15 DIAGNOSIS — O99335 Smoking (tobacco) complicating the puerperium: Secondary | ICD-10-CM | POA: Diagnosis not present

## 2016-10-15 DIAGNOSIS — O9873 Human immunodeficiency virus [HIV] disease complicating the puerperium: Secondary | ICD-10-CM | POA: Insufficient documentation

## 2016-10-15 DIAGNOSIS — J45909 Unspecified asthma, uncomplicated: Secondary | ICD-10-CM | POA: Diagnosis not present

## 2016-10-15 DIAGNOSIS — F329 Major depressive disorder, single episode, unspecified: Secondary | ICD-10-CM | POA: Insufficient documentation

## 2016-10-15 DIAGNOSIS — N924 Excessive bleeding in the premenopausal period: Secondary | ICD-10-CM

## 2016-10-15 DIAGNOSIS — N939 Abnormal uterine and vaginal bleeding, unspecified: Secondary | ICD-10-CM | POA: Diagnosis present

## 2016-10-15 LAB — CBC
HEMATOCRIT: 37.3 % (ref 36.0–46.0)
Hemoglobin: 11.9 g/dL — ABNORMAL LOW (ref 12.0–15.0)
MCH: 29 pg (ref 26.0–34.0)
MCHC: 31.9 g/dL (ref 30.0–36.0)
MCV: 91 fL (ref 78.0–100.0)
PLATELETS: 246 10*3/uL (ref 150–400)
RBC: 4.1 MIL/uL (ref 3.87–5.11)
RDW: 14.6 % (ref 11.5–15.5)
WBC: 6.4 10*3/uL (ref 4.0–10.5)

## 2016-10-15 MED ORDER — "TRANEXAMIC ACID 5% ORAL SOLUTION "
10.0000 mL | Freq: Once | ORAL | Status: AC
  Administered 2016-10-15: 10 mL via OROMUCOSAL
  Filled 2016-10-15: qty 10

## 2016-10-15 NOTE — MAU Note (Signed)
Post Partum Delivered on 09/05/2016. Pt reports she started having vaginal bleeding last week. Now it is heavier  Soaking pads and passing some small clots.

## 2016-10-15 NOTE — MAU Provider Note (Signed)
History     CSN: [id number]  Arrival date and time: 10/15/16 1213   First Provider Initiated Contact with Patient 10/15/16 1248      Chief Complaint  Patient presents with  . Vaginal Bleeding   [name] is a 31 y.o. [id number] postpartum NSVD [redacted]w[redacted]d ago presenting with 3 day history of bleeding heavier than a period. She states she's saturated 1 pad per hour today and has passed clots intermittently. Passed no tissue. After delivery she bled for 4 weeks with tapering quantity and brownish discharge interspersed with red spotting. Bleeding stopped until 3 days ago. Denies orthostatic symptoms. Not yet sexually active. Has not yet had her infectious disease follow-up appointment at [facility] but is taking HIV meds as directed. Is taking Norvasc 5 mg per day since hospital discharge due to persistent BP elevations with her preeclampsia. Most recent hemoglobin on 09/06/2016 was 10.7.    OB History  Gravida Para Term Preterm AB Living  6 5 4 1 1 4   SAB TAB Ectopic Multiple Live Births  0 0 1 0 4    # Outcome Date GA Lbr Len/2nd Weight Sex Delivery Anes PTL Lv  6 Term 09/05/16 [redacted]w[redacted]d 05:33 / 00:15 7 lb 4.9 oz (3.314 kg) F Vag-Vacuum EPI  LIV  5 Term 11/29/11 [redacted]w[redacted]d 07:10 / 00:05 7 lb 9.9 oz (3.455 kg) F Vag-Spont EPI  LIV     Birth Comments: 7-pound 10-ounce infant born at [redacted] weeks gestational age to a 30 year old gravida 5 para 3-1-1-3 female.  One sibling died at 76 of age due to pneumococcal meningitis.  This was thought to be unrelated to mother's problem with AIDS.   Mother had depression.  She quit using tobacco in January 2013.  She was O positive, rubella immune, RPR nonreactive, hepatitis surface antigen negative, HIV reactive and Group B Strep negative.   There was meconium noted at visceral rupture of membranes one hour prior to delivery.   She had a normal spontaneous vaginal delivery with APGAR scores of 9 and 9, which is not credible.  Her length was 20 inches and head  circumference 13 inches.  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 She passed her congenital heart screening and had normal screen for inborn errors of metabolism, had a negative HIV-1 RNA.  She passed her hearing screen.  Her peak bilirubin was 7.4. Growth and development has been normal.  4 Term 12/27/09   7 lb 11 oz (3.487 kg) M Vag-Spont   DEC  3 Preterm 06/28/07 [redacted]w[redacted]d  5 lb 15 oz (2.693 kg) F Vag-Spont EPI  LIV  2 Term 03/13/05 [redacted]w[redacted]d  6 lb 10 oz (3.005 kg) M Vag-Spont        Complications: Hypertension in pregnancy, preeclampsia, severe, delivered/postpartum  1 Ectopic  [redacted]w[redacted]d       DEC     Birth Comments: D&C        Past Medical History:  Diagnosis Date  . Asthma   . Depression    Postpartum after 1st pregnancy  . Hemorrhoids   . HIV positive (HCC)     Past Surgical History:  Procedure Laterality Date  . DILATION AND CURETTAGE OF UTERUS    . LAPAROSCOPY  01/06/2011   Procedure: LAPAROSCOPY OPERATIVE;  Surgeon: Roseanna Rainbow, MD;  Location: [facility];  Service: Gynecology;  Laterality: N/A;  . LAPAROTOMY  01/06/2011   Procedure: LAPAROTOMY;  Surgeon: Roseanna Rainbow, MD;  Location: [facility];  Service: Gynecology;  Laterality: N/A;  . SALPINGOOPHORECTOMY      Family History  Problem Relation Age of Onset  . Diabetes Mother   . Hypertension Mother   . Hyperlipidemia Mother   . Kidney disease Mother   . Diabetes Brother   . Diabetes Maternal Aunt   . Hypertension Maternal Aunt   . Asthma Daughter     Social History  Substance Use Topics  . Smoking status: Current Every Day Smoker    Packs/day: 0.50    Years: 7.00    Types: Cigarettes    Last attempt to quit: 03/29/2011  . Smokeless tobacco: Current User  . Alcohol use No    Allergies:  Allergies  Allergen Reactions  . Benadryl [Diphenhydramine] Itching    Prescriptions Prior to Admission  Medication Sig Dispense Refill Last Dose  . abacavir-lamiVUDine (EPZICOM) 600-300 MG tablet  Take 1 tablet by mouth daily.   09/03/2016 at Unknown time  . amLODipine (NORVASC) 5 MG tablet Take 1 tablet (5 mg total) by mouth daily. 30 tablet 0   . darunavir (PREZISTA) 600 MG tablet Take 600 mg by mouth 2 (two) times daily with a meal.   09/03/2016 at Unknown time  . docusate sodium (COLACE) 100 MG capsule Take 1 capsule (100 mg total) by mouth 2 (two) times daily as needed. (Patient taking differently: Take 100 mg by mouth 2 (two) times daily as needed for moderate constipation. ) 30 capsule 2 Past Month at Unknown time  . ferrous sulfate 325 (65 FE) MG tablet Take 1 tablet (325 mg total) by mouth daily. 30 tablet 3 Past Week at Unknown time  . ibuprofen (ADVIL,MOTRIN) 600 MG tablet Take 1 tablet (600 mg total) by mouth every 6 (six) hours as needed for headache, mild pain or cramping. 30 tablet 0   . Prenatal Vit-Fe Fumarate-FA (PREPLUS) 27-1 MG TABS Take 1 tablet by mouth daily. 30 tablet 13 09/03/2016 at Unknown time  . ritonavir (NORVIR) 100 MG TABS tablet Take 100 mg by mouth 2 (two) times daily.   09/03/2016 at Unknown time    Review of Systems  Constitutional: Negative for fever.  HENT: Negative for congestion.   Gastrointestinal: Positive for abdominal pain. Negative for constipation, diarrhea and nausea.       Intermittent cramps, not at present  Genitourinary: Negative for dysuria and pelvic pain.  Neurological: Negative for dizziness, syncope, weakness and light-headedness.  Psychiatric/Behavioral: The patient is not nervous/anxious.    Physical Exam   Blood pressure 128/68, pulse 89, temperature 98.3 F (36.8 C), temperature source Axillary, resp. rate 18, unknown if currently breastfeeding.  Physical Exam  Nursing note and vitals reviewed. Constitutional: She is oriented to person, place, and time. She appears well-developed and well-nourished. No distress.  HENT:  Head: Normocephalic.  Eyes: No scleral icterus.  Neck: Neck supple. No thyromegaly present.   Cardiovascular: Normal rate.   Respiratory: Effort normal.  GI: Soft. There is no tenderness. There is no guarding.  Fundus above the symphysis but involuted  Genitourinary: No vaginal discharge found.  Genitourinary Comments: Pelvic exam NEFG Vagina with moderate amount blood clots seen. Swabbed from vault and cervix and minimal if any active bleeding present. No tissue. Cx external loose 1 internal closed, long Uterus: Difficult to outline due to body habitus     Musculoskeletal: Normal range of motion.  Neurological: She is alert and oriented to person, place, and time.  Skin: Skin is warm and dry.  Psychiatric: She has a  normal mood and affect. Her behavior is normal.    MAU Course  Procedures Results for orders placed or performed during the hospital encounter of 10/15/16 (from the past 24 hour(s))  CBC     Status: Abnormal   Collection Time: 10/15/16  1:03 PM  Result Value Ref Range   WBC 6.4 4.0 - 10.5 K/uL   RBC 4.10 3.87 - 5.11 MIL/uL   Hemoglobin 11.9 (L) 12.0 - 15.0 g/dL   HCT 78.237.3 95.636.0 - 21.346.0 %   MCV 91.0 78.0 - 100.0 fL   MCH 29.0 26.0 - 34.0 pg   MCHC 31.9 30.0 - 36.0 g/dL   RDW 08.614.6 57.811.5 - 46.915.5 %   Platelets 246 150 - 400 K/uL   MDM Heavy bleeding at time of expected menses, hemodynamically stable. C/W Dr. Alysia PennaErvin> Will  give txa here for the bleeding Discharge home with bleeding precautions.  F/U next week for PP visit and get Nexplanon. WIll D/C Norvasc.  Assessment and Plan  [id number] 6 wks post NSVD HIV Preeclampsia resolved 1. Excessive bleeding in premenopausal period    Allergies as of 10/15/2016      Reactions   Benadryl [diphenhydramine] Itching      Medication List    STOP taking these medications   amLODipine 5 MG tablet Commonly known as:  NORVASC   docusate sodium 100 MG capsule Commonly known as:  COLACE   ibuprofen 600 MG tablet Commonly known as:  ADVIL,MOTRIN     TAKE these medications   abacavir-lamiVUDine 600-300 MG  tablet Commonly known as:  EPZICOM Take 1 tablet by mouth daily.   darunavir 600 MG tablet Commonly known as:  PREZISTA Take 600 mg by mouth 2 (two) times daily with a meal.   ferrous sulfate 325 (65 FE) MG tablet Take 1 tablet (325 mg total) by mouth daily.   ritonavir 100 MG Tabs tablet Commonly known as:  NORVIR Take 100 mg by mouth 2 (two) times daily.       Follow-up Information    [facility].   Specialty:  Obstetrics and Gynecology Why:  Keep your scheduled postpartum appointment Contact information: [address] [place] [id number] [phone]          Earley Favoroe, Dell Hurtubise C, CNM 10/15/2016 2:16 PM

## 2016-10-15 NOTE — Discharge Instructions (Signed)
Abnormal Uterine Bleeding Abnormal uterine bleeding can affect women at various stages in life, including teenagers, women in their reproductive years, pregnant women, and women who have reached menopause. Several kinds of uterine bleeding are considered abnormal, including:  Bleeding or spotting between periods.  Bleeding after sexual intercourse.  Bleeding that is heavier or more than normal.  Periods that last longer than usual.  Bleeding after menopause. Many cases of abnormal uterine bleeding are minor and simple to treat, while others are more serious. Any type of abnormal bleeding should be evaluated by your health care provider. Treatment will depend on the cause of the bleeding. Follow these instructions at home: Monitor your condition for any changes. The following actions may help to alleviate any discomfort you are experiencing:  Avoid the use of tampons and douches as directed by your health care provider.  Change your pads frequently. You should get regular pelvic exams and Pap tests. Keep all follow-up appointments for diagnostic tests as directed by your health care provider. Contact a health care provider if:  Your bleeding lasts more than 1 week.  You feel dizzy at times. Get help right away if:  You pass out.  You are changing pads every 15 to 30 minutes.  You have abdominal pain.  You have a fever.  You become sweaty or weak.  You are passing large blood clots from the vagina.  You start to feel nauseous and vomit. This information is not intended to replace advice given to you by your health care provider. Make sure you discuss any questions you have with your health care provider. Document Released: 03/09/2005 Document Revised: 08/21/2015 Document Reviewed: 10/06/2012 Elsevier Interactive Patient Education  2017 [employer] Inc.  

## 2016-10-20 ENCOUNTER — Ambulatory Visit: Payer: Medicaid Other | Admitting: Student

## 2016-10-21 ENCOUNTER — Encounter: Payer: Self-pay | Admitting: Family Medicine

## 2016-10-21 ENCOUNTER — Ambulatory Visit: Payer: Medicaid Other | Admitting: Advanced Practice Midwife

## 2016-10-21 NOTE — Progress Notes (Signed)
Per Heather Hogan, CNM pt does not need to be contacted in regards to missed appt.  

## 2017-06-04 ENCOUNTER — Ambulatory Visit: Payer: Self-pay | Admitting: Student

## 2017-06-06 ENCOUNTER — Other Ambulatory Visit: Payer: Self-pay

## 2017-06-06 ENCOUNTER — Encounter (HOSPITAL_COMMUNITY): Payer: Self-pay | Admitting: *Deleted

## 2017-06-06 DIAGNOSIS — N751 Abscess of Bartholin's gland: Secondary | ICD-10-CM | POA: Insufficient documentation

## 2017-06-06 DIAGNOSIS — J45909 Unspecified asthma, uncomplicated: Secondary | ICD-10-CM | POA: Diagnosis not present

## 2017-06-06 DIAGNOSIS — F17210 Nicotine dependence, cigarettes, uncomplicated: Secondary | ICD-10-CM | POA: Insufficient documentation

## 2017-06-06 DIAGNOSIS — Z79899 Other long term (current) drug therapy: Secondary | ICD-10-CM | POA: Insufficient documentation

## 2017-06-06 NOTE — ED Triage Notes (Signed)
The pt has had a labia ?  Abscess for 3 days  No drainage  No temp  lmp 3 days ago

## 2017-06-07 ENCOUNTER — Emergency Department (HOSPITAL_COMMUNITY)
Admission: EM | Admit: 2017-06-07 | Discharge: 2017-06-07 | Disposition: A | Discharge status: Home / Self Care | Payer: Medicaid Other | Attending: Emergency Medicine | Admitting: Emergency Medicine

## 2017-06-07 DIAGNOSIS — N751 Abscess of Bartholin's gland: Secondary | ICD-10-CM

## 2017-06-07 MED ORDER — SULFAMETHOXAZOLE-TRIMETHOPRIM 800-160 MG PO TABS
1.0000 | ORAL_TABLET | Freq: Once | ORAL | Status: AC
Start: 2017-06-07 — End: 2017-06-07
  Administered 2017-06-07: 1 via ORAL
  Filled 2017-06-07: qty 1

## 2017-06-07 MED ORDER — IBUPROFEN 600 MG PO TABS: 600.0000 mg | ORAL_TABLET | Freq: Four times a day (QID) | ORAL | 0 refills | Status: DC | PRN

## 2017-06-07 MED ORDER — SULFAMETHOXAZOLE-TRIMETHOPRIM 800-160 MG PO TABS: 1.0000 | ORAL_TABLET | Freq: Two times a day (BID) | ORAL | 0 refills | Status: AC

## 2017-06-07 MED ORDER — LIDOCAINE HCL 2 % IJ SOLN
10.0000 mL | Freq: Once | INTRAMUSCULAR | Status: AC
  Administered 2017-06-07: 200 mg
  Filled 2017-06-07: qty 20

## 2017-06-07 NOTE — ED Provider Notes (Signed)
[facility] Provider Note   CSN: [id number] Arrival date & time: 06/06/17  2046     History   Chief Complaint Chief Complaint  Patient presents with  . Abscess    HPI [name] is a 32 y.o. female.  32 year old HIV positive female (CD4 [id number] in April 2018) presents to the emergency department for evaluation of genital soreness and pressure.  She reports 3 days of symptoms which have been gradually worsening.  She has been taking Tylenol for symptoms with mild relief.  No associated drainage from the area.  No fevers, vaginal discharge.  She denies a history of prior abscess.      Past Medical History:  Diagnosis Date  . Asthma   . Depression    Postpartum after 1st pregnancy  . Hemorrhoids   . HIV positive [facility](HCC)     Patient Active Problem List   Diagnosis Date Noted  . Status post vacuum-assisted vaginal delivery 09/07/2016  . Severe preeclampsia 09/07/2016  . Gestational hypertension w/o significant proteinuria in 3rd trimester 09/04/2016  . Supervision of high risk pregnancy, antepartum 06/08/2016  . History of preterm delivery, currently pregnant 06/08/2016  . Obesity affecting pregnancy 04/01/2016  . ASTHMA 07/08/2009  . CHLAMYDIAL INFECTION, HX OF 07/08/2009  . Human immunodeficiency virus (HIV) disease (HCC) 06/04/2009    Past Surgical History:  Procedure Laterality Date  . DILATION AND CURETTAGE OF UTERUS    . LAPAROSCOPY  01/06/2011   Procedure: LAPAROSCOPY OPERATIVE;  Surgeon: Roseanna RainbowLisa A Jackson-Moore, MD;  Location: [facility];  Service: Gynecology;  Laterality: N/A;  . LAPAROTOMY  01/06/2011   Procedure: LAPAROTOMY;  Surgeon: Roseanna RainbowLisa A Jackson-Moore, MD;  Location: [facility];  Service: Gynecology;  Laterality: N/A;  . SALPINGOOPHORECTOMY      OB History    Gravida Para Term Preterm AB Living   6 5 4 1 1 4    SAB TAB Ectopic Multiple Live Births   0 0 1 0 4       Home Medications    Prior to Admission  medications   Medication Sig Start Date End Date Taking? Authorizing Provider  abacavir-lamiVUDine (EPZICOM) 600-300 MG tablet Take 1 tablet by mouth daily. 05/07/16   [provider]  darunavir (PREZISTA) 600 MG tablet Take 600 mg by mouth 2 (two) times daily with a meal. 05/07/16   [provider]  ferrous sulfate 325 (65 FE) MG tablet Take 1 tablet (325 mg total) by mouth daily. 06/08/16   Lesly DukesLeggett, Laythan Hayter H, MD  ibuprofen (ADVIL,MOTRIN) 600 MG tablet Take 1 tablet (600 mg total) by mouth every 6 (six) hours as needed. 06/07/17   Antony MaduraHumes, Kalum Minner, PA-C  ritonavir (NORVIR) 100 MG TABS tablet Take 100 mg by mouth 2 (two) times daily. 05/07/16   [provider]  sulfamethoxazole-trimethoprim (BACTRIM DS,SEPTRA DS) 800-160 MG tablet Take 1 tablet by mouth 2 (two) times daily for 7 days. 06/07/17 06/14/17  Antony MaduraHumes, Chassity Ludke, PA-C    Family History Family History  Problem Relation Age of Onset  . Diabetes Mother   . Hypertension Mother   . Hyperlipidemia Mother   . Kidney disease Mother   . Diabetes Brother   . Diabetes Maternal Aunt   . Hypertension Maternal Aunt   . Asthma Daughter     Social History Social History   Tobacco Use  . Smoking status: Current Every Day Smoker    Packs/day: 0.50    Years: 7.00    Pack years: 3.50  Types: Cigarettes    Last attempt to quit: 03/29/2011    Years since quitting: 6.1  . Smokeless tobacco: Current User  Substance Use Topics  . Alcohol use: No    Alcohol/week: 0.0 oz  . Drug use: No     Allergies   Benadryl [diphenhydramine]   Review of Systems Review of Systems Ten systems reviewed and are negative for acute change, except as noted in the HPI.    Physical Exam Updated Vital Signs BP 128/89   Pulse (!) 101   Temp 98.8 F (37.1 C)   Resp 16   Ht 5\' 2"  (1.575 m)   Wt 77.1 kg (170 lb)   LMP 06/03/2017   SpO2 100%   BMI 31.09 kg/m   Physical Exam  Constitutional: She is oriented to person, place, and time.  She appears well-developed and well-nourished. No distress.  Nontoxic appearing and in no acute distress  HENT:  Head: Normocephalic and atraumatic.  Eyes: Conjunctivae and EOM are normal. No scleral icterus.  Neck: Normal range of motion.  Pulmonary/Chest: Effort normal. No respiratory distress.  Genitourinary:  Genitourinary Comments: Fluctuance to the right introitus at site of Bartholin cyst consistent with Bartholin's abscess.  Induration extends slightly to the perineum on the right.  No active drainage.  Mild tenderness to the area.  Musculoskeletal: Normal range of motion.  Neurological: She is alert and oriented to person, place, and time. She exhibits normal muscle tone. Coordination normal.  Skin: Skin is warm and dry. No rash noted. She is not diaphoretic. No erythema. No pallor.  Psychiatric: She has a normal mood and affect. Her behavior is normal.  Nursing note and vitals reviewed.    ED Treatments / Results  Labs (all labs ordered are listed, but only abnormal results are displayed) Labs Reviewed - No data to display  EKG  EKG Interpretation None       Radiology No results found.  Procedures Procedures (including critical care time)  INCISION AND DRAINAGE Performed by: Antony Madura Consent: Verbal consent obtained. Risks and benefits: risks, benefits and alternatives were discussed Type: abscess  Body area: right bartholin's abscess  Anesthesia: local infiltration  Incision was made with a scalpel.  Local anesthetic: lidocaine 2% without epinephrine  Anesthetic total: 2 ml  Complexity: complex Blunt dissection to break up loculations  Drainage: purulent  Drainage amount: ~10cc  Packing material: none  Patient tolerance: Patient tolerated the procedure well with no immediate complications.   Medications Ordered in ED Medications  sulfamethoxazole-trimethoprim (BACTRIM DS,SEPTRA DS) 800-160 MG per tablet 1 tablet (not administered)    lidocaine (XYLOCAINE) 2 % (with pres) injection 200 mg (200 mg Infiltration Given 06/07/17 0241)     Initial Impression / Assessment and Plan / ED Course  I have reviewed the triage vital signs and the nursing notes.  Pertinent labs & imaging results that were available during my care of the patient were reviewed by me and considered in my medical decision making (see chart for details).     Patient with abscess amenable to incision and drainage.  Abscess was not large enough to warrant packing or drain, wound recheck in 2 days.  Encouraged home warm soaks and flushing.  Will discharge home on Bactrim.  Return precautions discussed and provided. Patient discharged in stable condition with no unaddressed concerns.   Final Clinical Impressions(s) / ED Diagnoses   Final diagnoses:  Bartholin's gland abscess    ED Discharge Orders  Ordered    sulfamethoxazole-trimethoprim (BACTRIM DS,SEPTRA DS) 800-160 MG tablet  2 times daily     06/07/17 0411    ibuprofen (ADVIL,MOTRIN) 600 MG tablet  Every 6 hours PRN     06/07/17 0411       Antony Madura, PA-C 06/07/17 4098    Geoffery Lyons, MD 06/07/17 [phone]

## 2017-06-07 NOTE — Discharge Instructions (Signed)
Continue with warm soaks or compresses 3-4 times per day to promote drainage.  We recommend Tylenol or ibuprofen for discomfort.  Take Bactrim as prescribed until finished.  Have your wound rechecked by your primary doctor or OB/GYN.

## 2017-06-14 ENCOUNTER — Ambulatory Visit: Payer: Self-pay | Admitting: Student

## 2017-06-14 NOTE — Progress Notes (Signed)
Pt dnka. [facility]

## 2017-07-27 ENCOUNTER — Encounter: Payer: Self-pay | Admitting: *Deleted

## 2017-10-19 IMAGING — US US MFM OB COMP +14 WKS
2 of 3 series · 14 of 28 positions shown · non-contrast
Comparison: none

[Series 1: us mfm ob comp +14 wks · 11 of 71 slices shown (1 of 2)]
[im 4/71]
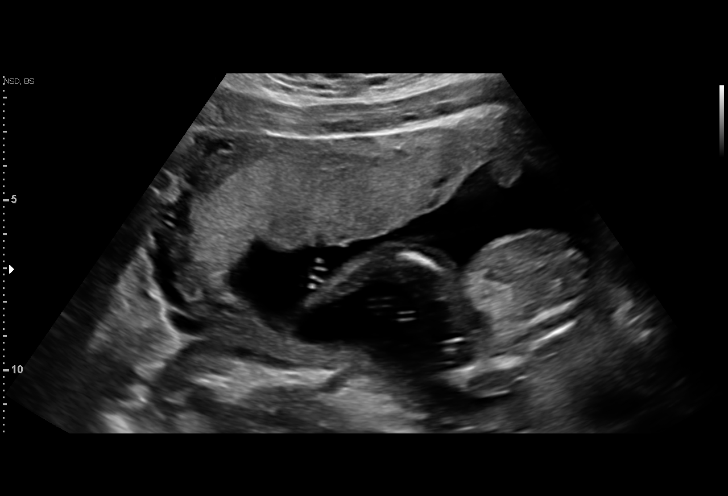
[im 11/71]
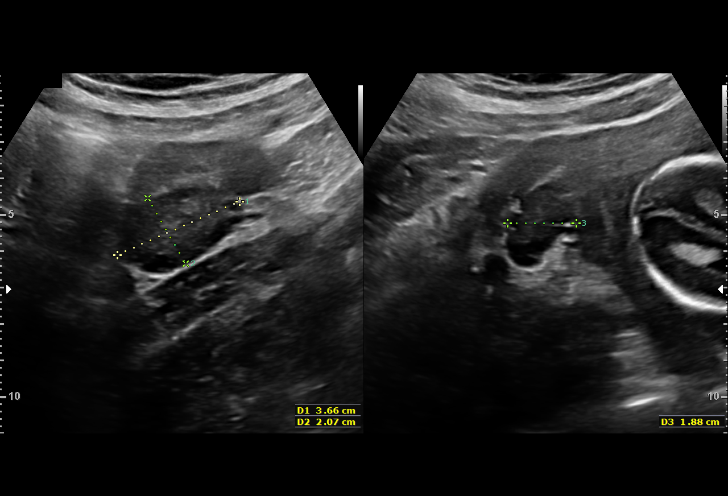
[im 17/71]
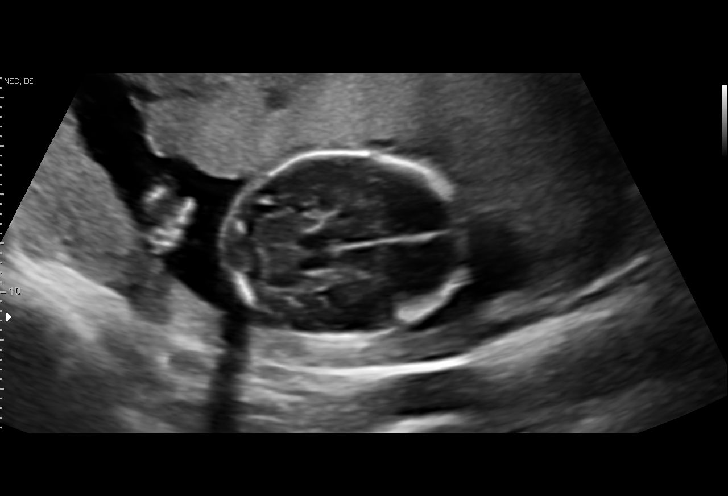
[im 24/71]
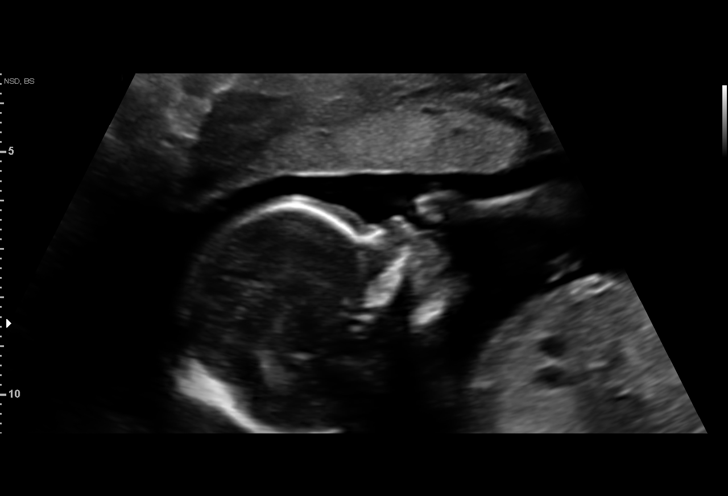
[im 31/71]
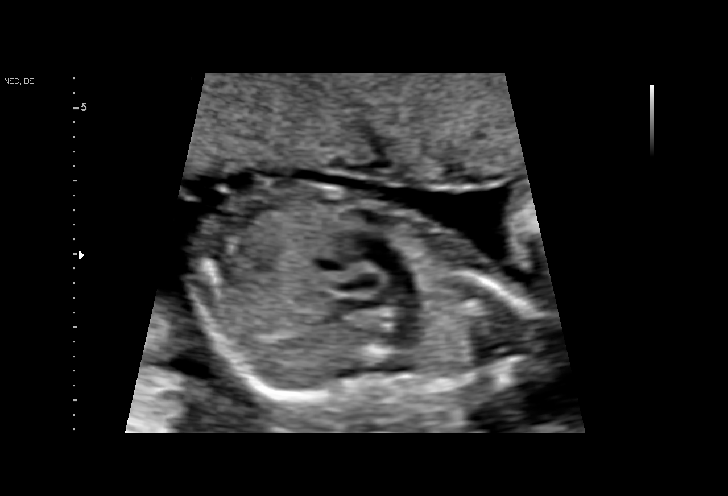
[im 37/71]
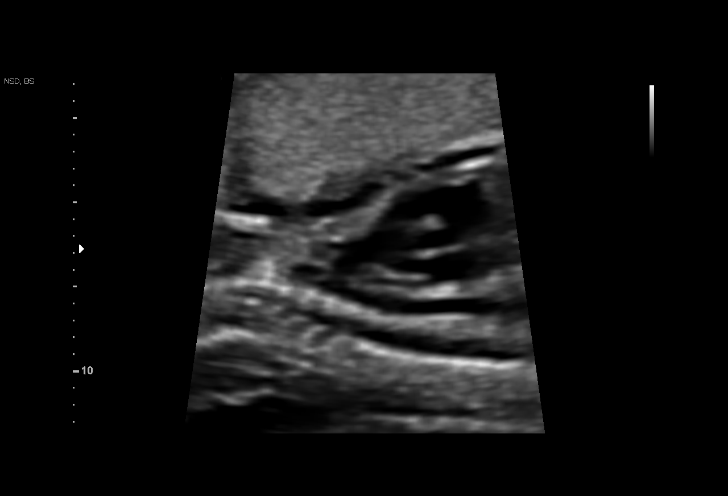
[im 44/71]
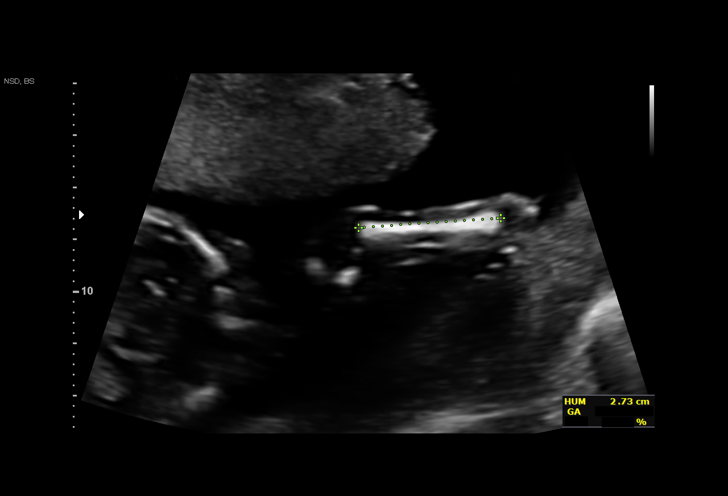
[im 51/71]
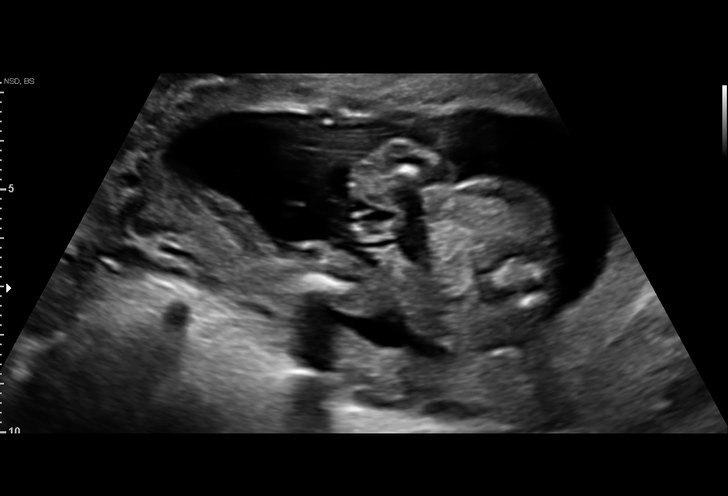
[im 57/71]
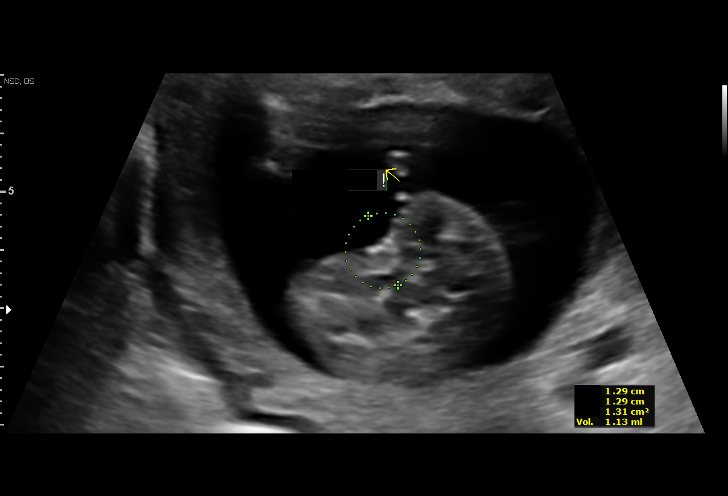
[im 64/71]
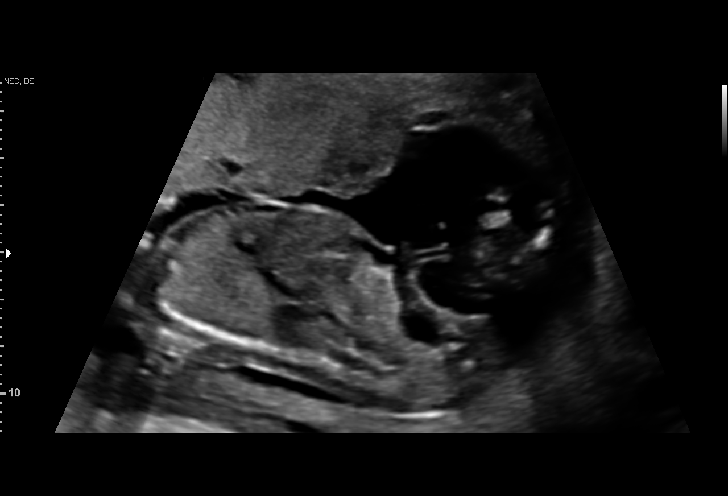
[im 71/71]
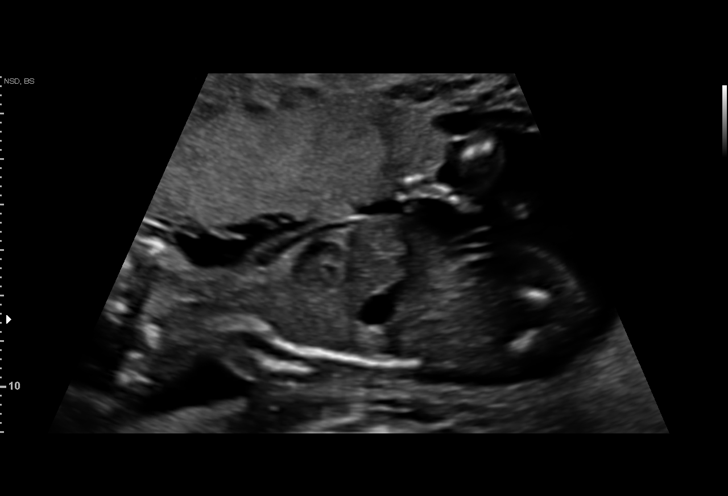

[Series 4: us mfm ob comp +14 wks · 14 acquisitions, 3 frames shown (2 of 2)]
[im 1/14]
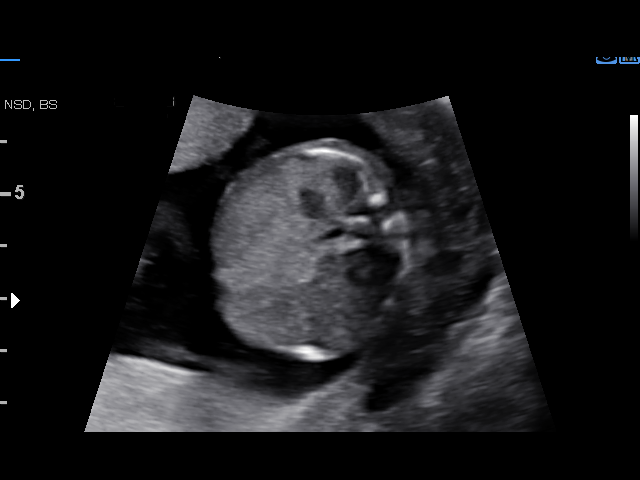
[im 7/14]
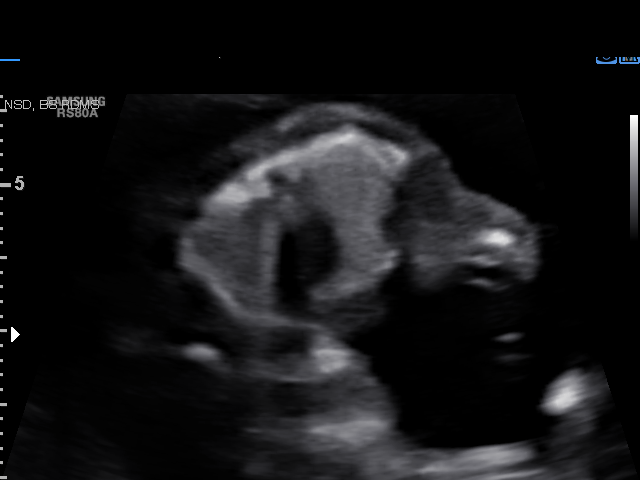
[im 14/14]
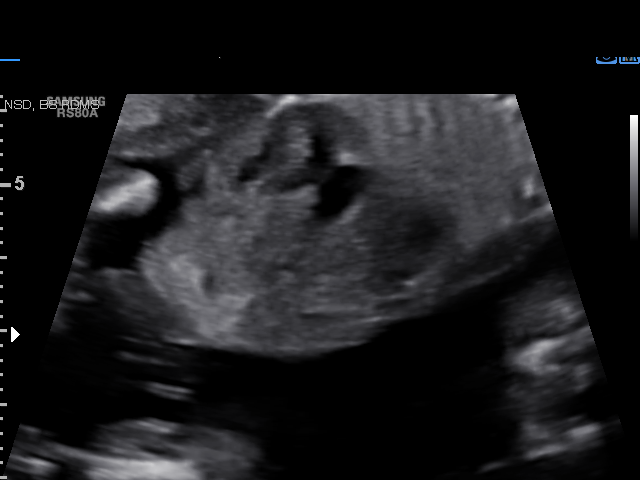

[14 of 28 positions shown; findings below may reference images not displayed]

OB/Gyn Clinic

1  RAHIL MORTENSON            [id number]      [id number]     [phone]
Indications

18 weeks gestation of pregnancy
Encounter for fetal anatomic survey
Encounter for uncertain dates
HIV affecting pregnancy, second trimester
Tobacco use complicating pregnancy,
second trimester
Asthma                                         G33.F3 [id number]
Poor obstetric history: Previous preterm
delivery, antepartum (28 weeks with 2
subsequent term)
OB History

Gravidity:    6         Term:   3        Prem:   1        SAB:   0
TOP:          0       Ectopic:  1        Living: 3
Fetal Evaluation

Num Of Fetuses:     1
Fetal Heart         153
Rate(bpm):
Cardiac Activity:   Observed
Presentation:       Breech
Placenta:           Anterior, above cervical os
P. Cord Insertion:  Visualized, central
Amniotic Fluid
AFI FV:      Subjectively within normal limits

Largest Pocket(cm)
5.6
Biometry

BPD:      39.7  mm     G. Age:  18w 0d         40  %    CI:        74.14   %   70 - 86
FL/HC:      18.3   %   15.8 - 18
HC:      146.4  mm     G. Age:  17w 6d         21  %    HC/AC:      1.06       1.07 -
AC:      138.6  mm     G. Age:  19w 2d         78  %    FL/BPD:     67.5   %
FL:       26.8  mm     G. Age:  18w 1d         40  %    FL/AC:      19.3   %   20 - 24
HUM:      26.7  mm     G. Age:  18w 3d         59  %
CER:      18.8  mm     G. Age:  18w 3d         54  %
NFT:       3.3  mm
CM:        3.7  mm

Est. FW:     250  gm      0 lb 9 oz     53  %
Gestational Age

U/S Today:     18w 2d                                        EDD:   09/14/16
Best:          18w 2d    Det. By:   U/S (04/15/16)           EDD:   09/14/16
Anatomy

Cranium:               Appears normal         Aortic Arch:            Appears normal
Cavum:                 Appears normal         Ductal Arch:            Appears normal
Ventricles:            Appears normal         Diaphragm:              Appears normal
Choroid Plexus:        Appears normal         Stomach:                Appears normal, left
sided
Cerebellum:            Appears normal         Abdomen:                Appears normal
Posterior Fossa:       Appears normal         Abdominal Wall:         Appears nml (cord
insert, abd wall)
Nuchal Fold:           Appears normal         Cord Vessels:           Appears normal (3
vessel cord)
Face:                  Appears normal         Kidneys:                Appear normal
(orbits and profile)
Lips:                  Appears normal         Bladder:                Appears normal
Thoracic:              Appears normal         Spine:                  Appears normal
Heart:                 Appears normal         Upper Extremities:      Appears normal
(4CH, axis, and situs
RVOT:                  Appears normal         Lower Extremities:      Appears normal
LVOT:                  Appears normal

Other:  Fetus appears to be a female. Heels and 5th digit visualized. Nasal
bone visualized. Open hands visualized.
Cervix Uterus Adnexa

Cervix
Length:            3.9  cm.
Normal appearance by transabdominal scan.

Left Ovary
Within normal limits.

Right Ovary
Within normal limits.
Adnexa:       No abnormality visualized.
Impression

SIUP at 18+2 weeks
Normal detailed fetal anatomy
Markers of aneuploidy: none
Normal amniotic fluid volume
EDC based on today's measurements: 09/14/16
Recommendations

Follow-up ultrasound for growth in 6 weeks

## 2018-05-03 DIAGNOSIS — F331 Major depressive disorder, recurrent, moderate: Secondary | ICD-10-CM | POA: Diagnosis not present

## 2018-05-10 DIAGNOSIS — F331 Major depressive disorder, recurrent, moderate: Secondary | ICD-10-CM | POA: Diagnosis not present

## 2018-05-12 DIAGNOSIS — F331 Major depressive disorder, recurrent, moderate: Secondary | ICD-10-CM | POA: Diagnosis not present

## 2018-05-17 DIAGNOSIS — F331 Major depressive disorder, recurrent, moderate: Secondary | ICD-10-CM | POA: Diagnosis not present

## 2018-05-19 DIAGNOSIS — F331 Major depressive disorder, recurrent, moderate: Secondary | ICD-10-CM | POA: Diagnosis not present

## 2018-05-24 DIAGNOSIS — F331 Major depressive disorder, recurrent, moderate: Secondary | ICD-10-CM | POA: Diagnosis not present

## 2018-05-27 DIAGNOSIS — F331 Major depressive disorder, recurrent, moderate: Secondary | ICD-10-CM | POA: Diagnosis not present

## 2018-05-31 DIAGNOSIS — F331 Major depressive disorder, recurrent, moderate: Secondary | ICD-10-CM | POA: Diagnosis not present

## 2018-06-03 DIAGNOSIS — F331 Major depressive disorder, recurrent, moderate: Secondary | ICD-10-CM | POA: Diagnosis not present

## 2018-06-07 DIAGNOSIS — F331 Major depressive disorder, recurrent, moderate: Secondary | ICD-10-CM | POA: Diagnosis not present

## 2018-06-10 DIAGNOSIS — F331 Major depressive disorder, recurrent, moderate: Secondary | ICD-10-CM | POA: Diagnosis not present

## 2018-06-14 NOTE — Congregational Nurse Program (Signed)
  Dept: [phone]   Congregational Nurse Program Note  Date of Encounter: 06/14/2018  Past Medical History: Past Medical History:  Diagnosis Date  . Asthma   . Depression    Postpartum after 1st pregnancy  . Hemorrhoids   . HIV positive [facility])     Encounter Details: CNP Questionnaire - 06/14/18 1938      Questionnaire   Patient Status  Not Applicable    Race  Black or African American    Location Patient Served At  Not Applicable    Insurance  Medicaid    Uninsured  Not Applicable    Food  No food insecurities;Within past 12 months, food ran out with no money to buy more    Housing/Utilities  No permanent housing    Transportation  Within past 12 months, lack of transportation negatively impacted life;No transportation needs    Interpersonal Safety  Yes, feel physically and emotionally safe where you currently live    Medication  Yes, have medication insecurities    Medical Provider  No    Referrals  Primary Care Provider/Clinic    ED Visit Averted  Not Applicable    Life-Saving Intervention Made  Not Applicable     Initial visit to see nurse states she has fullness in her right ear ,had a  sinus infection since November taking over the counter medications but seems that it isn't going away and now it feels full ,can hear an echo when she talks . Has 4 children ages 60,6,10,13 No PCP ,no chronic diseases ,here at the [facility] since November ,smokes about 5 per day has decreased some ,had just smoked a few before B/P was taken ,counseled in regards to smoking and blood pressure. [employer] food stamps . Gave her the information on the [facility] clinic to call for PCP  To establish care . Will call in 2 weeks as all general care on hold right now . Will talk with MD at  [facility] clinic to see if we can get her some relief for her ear pain. Will try eviist ??? To follow up tomorrow.

## 2018-06-15 DIAGNOSIS — F331 Major depressive disorder, recurrent, moderate: Secondary | ICD-10-CM | POA: Diagnosis not present

## 2018-06-15 NOTE — Congregational Nurse Program (Signed)
  Dept: [phone]   Congregational Nurse Program Note  Date of Encounter: 06/15/2018  Past Medical History: Past Medical History:  Diagnosis Date  . Asthma   . Depression    Postpartum after 1st pregnancy  . Hemorrhoids   . HIV positive [facility])     Encounter Details: CNP Questionnaire - 06/15/18 1637      Questionnaire   Patient Status  Not Applicable    Race  Black or African American    Location Patient Served At  Not Applicable    Insurance  Medicaid    Uninsured  Not Applicable    Food  No food insecurities    Housing/Utilities  No permanent housing    Transportation  No transportation needs    Interpersonal Safety  Yes, feel physically and emotionally safe where you currently live    Medication  Yes, have medication insecurities    Medical Provider  No    Referrals  Area Agency;Primary Care Provider/Clinic     Discussed symptoms with Dr. Luisa Hart and he will do an e-visit on tomorrow with client . Informed client and she had received a call that visit would be at 2 pm . Thanked nurse .  Follow up next week !

## 2018-06-16 ENCOUNTER — Ambulatory Visit: Payer: Medicaid Other | Attending: Critical Care Medicine | Admitting: Critical Care Medicine

## 2018-06-16 ENCOUNTER — Encounter: Payer: Self-pay | Admitting: Critical Care Medicine

## 2018-06-16 ENCOUNTER — Other Ambulatory Visit: Payer: Self-pay

## 2018-06-16 DIAGNOSIS — H6501 Acute serous otitis media, right ear: Secondary | ICD-10-CM | POA: Diagnosis not present

## 2018-06-16 DIAGNOSIS — J3089 Other allergic rhinitis: Secondary | ICD-10-CM

## 2018-06-16 DIAGNOSIS — J309 Allergic rhinitis, unspecified: Secondary | ICD-10-CM | POA: Insufficient documentation

## 2018-06-16 MED ORDER — AZITHROMYCIN 250 MG PO TABS: 250.0000 mg | ORAL_TABLET | Freq: Every day | ORAL | 0 refills | Status: DC

## 2018-06-16 MED ORDER — FLUTICASONE PROPIONATE 50 MCG/ACT NA SUSP: 2.0000 | Freq: Every day | NASAL | 6 refills | Status: DC

## 2018-06-16 MED ORDER — PREDNISONE 10 MG PO TABS: 20.0000 mg | ORAL_TABLET | Freq: Every day | ORAL | 0 refills | Status: AC

## 2018-06-16 NOTE — Assessment & Plan Note (Signed)
For the allergic rhinitis we will begin Flonase 2 sprays each nostril daily and a prescription for this was sent as well

## 2018-06-16 NOTE — Progress Notes (Signed)
Subjective:    Patient ID: [name], female    DOB: 07-Jun-1985, 33 y.o.   MRN: [id number]  Virtual Visit via Telephone Note  I connected with [name] on 06/16/18 at  2:00 PM EDT by telephone and verified that I am speaking with the correct person using two identifiers.   I discussed the limitations, risks, security and privacy concerns of performing an evaluation and management service by telephone and the availability of in person appointments. I also discussed with the patient that there may be a patient responsible charge related to this service. The patient expressed understanding and agreed to proceed.   History of Present Illness: Body of this is a 33 year old female with previous history of HIV, asthma, obesity,   seen today via telephone visit for symptoms of ear congestion and decreased hearing in the right ear.  The patient does have some nasal mucus.  The patient has a minimal cough that is minimally productive.  There is no fever.  The patient smokes 1 pack/week of cigarettes.  The patient notes more dyspnea with exertion.  Note the patient is homeless and living in the [facility] homeless shelter at this time.  The patient just moved in the area in November of last year.  The patient notes increasing nasal congestion since moving to the [facility] area.  Patient notes she had a flulike illness several weeks ago and has not been the same since.  Now with the pollen season the patient symptom complex is worsening.   Observations/Objective:  Review of Systems  Constitutional: Negative for fever.  HENT: Positive for hearing loss. Negative for ear pain, nosebleeds, postnasal drip, rhinorrhea, sinus pressure, sinus pain, sneezing, sore throat and trouble swallowing.   Respiratory: Negative for cough, choking, shortness of breath and wheezing.   Cardiovascular: Negative.   Gastrointestinal: Negative.   Neurological: Negative.   Psychiatric/Behavioral: Negative.     Note no vital signs or physical exam was able to be performed as this was a telephonic visit Assessment and Plan:     Non-recurrent acute serous otitis media of right ear The patient symptom complex is most compatible with allergic rhinitis resulting in acute non-recurrent serous otitis media of the right ear.  She may well have a sinus infection as well.  This was a telephone virtual visit and so exam was limited.  Plan will be to administer prednisone for a short course at 20 mg daily for 5 days and also azithromycin 500 mg x 1 dose then 250 mg daily thereafter for 5-day course  We will make efforts to see if we can get this patient into the office for a follow-up exam in the near future  Allergic rhinitis due to allergen For the allergic rhinitis we will begin Flonase 2 sprays each nostril daily and a prescription for this was sent as well   Diagnoses and all orders for this visit:  Non-recurrent acute serous otitis media of right ear  Seasonal allergic rhinitis due to other allergic trigger  Other orders -     predniSONE (DELTASONE) 10 MG tablet; Take 2 tablets (20 mg total) by mouth daily for 5 days. -     azithromycin (ZITHROMAX) 250 MG tablet; Take 1 tablet (250 mg total) by mouth daily. Take two once then one daily until gone -     fluticasone (FLONASE) 50 MCG/ACT nasal spray; Place 2 sprays into both nostrils daily.  Follow Up Instructions:    I discussed the  assessment and treatment plan with the patient. The patient was provided an opportunity to ask questions and all were answered. The patient agreed with the plan and demonstrated an understanding of the instructions.   The patient was advised to call back or seek an in-person evaluation if the symptoms worsen or if the condition fails to improve as anticipated.  I provided 20 minutes of non-face-to-face time during this encounter.   Shan Levans, MD

## 2018-06-16 NOTE — Assessment & Plan Note (Signed)
The patient symptom complex is most compatible with allergic rhinitis resulting in acute non-recurrent serous otitis media of the right ear.  She may well have a sinus infection as well.  This was a telephone virtual visit and so exam was limited.  Plan will be to administer prednisone for a short course at 20 mg daily for 5 days and also azithromycin 500 mg x 1 dose then 250 mg daily thereafter for 5-day course  We will make efforts to see if we can get this patient into the office for a follow-up exam in the near future

## 2018-06-22 DIAGNOSIS — F331 Major depressive disorder, recurrent, moderate: Secondary | ICD-10-CM | POA: Diagnosis not present

## 2018-06-24 DIAGNOSIS — F331 Major depressive disorder, recurrent, moderate: Secondary | ICD-10-CM | POA: Diagnosis not present

## 2018-06-27 DIAGNOSIS — F331 Major depressive disorder, recurrent, moderate: Secondary | ICD-10-CM | POA: Diagnosis not present

## 2018-06-29 NOTE — Congregational Nurse Program (Signed)
  Dept: [phone]   Congregational Nurse Program Note  Date of Encounter: 06/29/2018  Past Medical History: Past Medical History:  Diagnosis Date  . Asthma   . Depression    Postpartum after 1st pregnancy  . Hemorrhoids   . HIV positive (HCC)     Encounter Details: CNP Questionnaire - 06/29/18 1840      Questionnaire   Patient Status  Not Applicable    Race  Black or African American    Location Patient Served At  Not Applicable    Insurance  Medicaid    Uninsured  Not Applicable    Food  No food insecurities    Housing/Utilities  No permanent housing    Transportation  No transportation needs    Interpersonal Safety  Yes, feel physically and emotionally safe where you currently live    Medication  No medication insecurities    Medical Provider  No    Referrals  Primary Care Provider/Clinic    ED Visit Averted  Not Applicable    Life-Saving Intervention Made  Not Applicable     Brieff visit to follow up with client that had an e-visit with Dr Luisa Hart regarding her ear pain. States she feels better ,viist went well ,thanked nurse

## 2018-07-04 DIAGNOSIS — F331 Major depressive disorder, recurrent, moderate: Secondary | ICD-10-CM | POA: Diagnosis not present

## 2018-07-06 DIAGNOSIS — F331 Major depressive disorder, recurrent, moderate: Secondary | ICD-10-CM | POA: Diagnosis not present

## 2018-07-14 DIAGNOSIS — F331 Major depressive disorder, recurrent, moderate: Secondary | ICD-10-CM | POA: Diagnosis not present

## 2018-07-20 DIAGNOSIS — F331 Major depressive disorder, recurrent, moderate: Secondary | ICD-10-CM | POA: Diagnosis not present

## 2018-07-22 DIAGNOSIS — F331 Major depressive disorder, recurrent, moderate: Secondary | ICD-10-CM | POA: Diagnosis not present

## 2018-07-27 DIAGNOSIS — F331 Major depressive disorder, recurrent, moderate: Secondary | ICD-10-CM | POA: Diagnosis not present

## 2018-08-02 DIAGNOSIS — F331 Major depressive disorder, recurrent, moderate: Secondary | ICD-10-CM | POA: Diagnosis not present

## 2018-08-05 DIAGNOSIS — F331 Major depressive disorder, recurrent, moderate: Secondary | ICD-10-CM | POA: Diagnosis not present

## 2018-08-07 DIAGNOSIS — F331 Major depressive disorder, recurrent, moderate: Secondary | ICD-10-CM | POA: Diagnosis not present

## 2018-08-09 DIAGNOSIS — F331 Major depressive disorder, recurrent, moderate: Secondary | ICD-10-CM | POA: Diagnosis not present

## 2018-08-12 DIAGNOSIS — F331 Major depressive disorder, recurrent, moderate: Secondary | ICD-10-CM | POA: Diagnosis not present

## 2018-08-15 DIAGNOSIS — F331 Major depressive disorder, recurrent, moderate: Secondary | ICD-10-CM | POA: Diagnosis not present

## 2018-08-19 DIAGNOSIS — F331 Major depressive disorder, recurrent, moderate: Secondary | ICD-10-CM | POA: Diagnosis not present

## 2018-08-26 DIAGNOSIS — F331 Major depressive disorder, recurrent, moderate: Secondary | ICD-10-CM | POA: Diagnosis not present

## 2018-08-29 DIAGNOSIS — F331 Major depressive disorder, recurrent, moderate: Secondary | ICD-10-CM | POA: Diagnosis not present

## 2018-08-31 DIAGNOSIS — F331 Major depressive disorder, recurrent, moderate: Secondary | ICD-10-CM | POA: Diagnosis not present

## 2018-09-03 ENCOUNTER — Other Ambulatory Visit: Payer: Self-pay | Admitting: *Deleted

## 2018-09-03 DIAGNOSIS — Z20822 Contact with and (suspected) exposure to covid-19: Secondary | ICD-10-CM

## 2018-09-05 LAB — NOVEL CORONAVIRUS, NAA: SARS-CoV-2, NAA: NOT DETECTED

## 2018-09-06 DIAGNOSIS — F331 Major depressive disorder, recurrent, moderate: Secondary | ICD-10-CM | POA: Diagnosis not present

## 2018-09-08 DIAGNOSIS — F331 Major depressive disorder, recurrent, moderate: Secondary | ICD-10-CM | POA: Diagnosis not present

## 2018-09-15 DIAGNOSIS — F331 Major depressive disorder, recurrent, moderate: Secondary | ICD-10-CM | POA: Diagnosis not present

## 2018-09-21 DIAGNOSIS — F331 Major depressive disorder, recurrent, moderate: Secondary | ICD-10-CM | POA: Diagnosis not present

## 2018-09-22 DIAGNOSIS — F331 Major depressive disorder, recurrent, moderate: Secondary | ICD-10-CM | POA: Diagnosis not present

## 2018-09-28 DIAGNOSIS — F331 Major depressive disorder, recurrent, moderate: Secondary | ICD-10-CM | POA: Diagnosis not present

## 2018-09-29 DIAGNOSIS — F331 Major depressive disorder, recurrent, moderate: Secondary | ICD-10-CM | POA: Diagnosis not present

## 2018-10-03 DIAGNOSIS — F331 Major depressive disorder, recurrent, moderate: Secondary | ICD-10-CM | POA: Diagnosis not present

## 2018-10-05 DIAGNOSIS — F331 Major depressive disorder, recurrent, moderate: Secondary | ICD-10-CM | POA: Diagnosis not present

## 2018-10-10 DIAGNOSIS — F331 Major depressive disorder, recurrent, moderate: Secondary | ICD-10-CM | POA: Diagnosis not present

## 2018-10-12 DIAGNOSIS — F331 Major depressive disorder, recurrent, moderate: Secondary | ICD-10-CM | POA: Diagnosis not present

## 2018-10-17 DIAGNOSIS — F331 Major depressive disorder, recurrent, moderate: Secondary | ICD-10-CM | POA: Diagnosis not present

## 2018-10-31 DIAGNOSIS — F331 Major depressive disorder, recurrent, moderate: Secondary | ICD-10-CM | POA: Diagnosis not present

## 2018-11-02 DIAGNOSIS — F331 Major depressive disorder, recurrent, moderate: Secondary | ICD-10-CM | POA: Diagnosis not present

## 2018-11-04 ENCOUNTER — Other Ambulatory Visit: Payer: Self-pay

## 2018-11-04 DIAGNOSIS — Z20822 Contact with and (suspected) exposure to covid-19: Secondary | ICD-10-CM

## 2018-11-05 LAB — NOVEL CORONAVIRUS, NAA: SARS-CoV-2, NAA: NOT DETECTED

## 2018-11-09 DIAGNOSIS — F331 Major depressive disorder, recurrent, moderate: Secondary | ICD-10-CM | POA: Diagnosis not present

## 2018-11-11 DIAGNOSIS — F331 Major depressive disorder, recurrent, moderate: Secondary | ICD-10-CM | POA: Diagnosis not present

## 2018-11-15 DIAGNOSIS — F331 Major depressive disorder, recurrent, moderate: Secondary | ICD-10-CM | POA: Diagnosis not present

## 2018-11-22 DIAGNOSIS — F331 Major depressive disorder, recurrent, moderate: Secondary | ICD-10-CM | POA: Diagnosis not present

## 2018-12-06 DIAGNOSIS — F331 Major depressive disorder, recurrent, moderate: Secondary | ICD-10-CM | POA: Diagnosis not present

## 2018-12-13 DIAGNOSIS — F331 Major depressive disorder, recurrent, moderate: Secondary | ICD-10-CM | POA: Diagnosis not present

## 2018-12-20 DIAGNOSIS — F331 Major depressive disorder, recurrent, moderate: Secondary | ICD-10-CM | POA: Diagnosis not present

## 2018-12-25 DIAGNOSIS — F331 Major depressive disorder, recurrent, moderate: Secondary | ICD-10-CM | POA: Diagnosis not present

## 2019-01-04 DIAGNOSIS — F331 Major depressive disorder, recurrent, moderate: Secondary | ICD-10-CM | POA: Diagnosis not present

## 2019-01-10 ENCOUNTER — Other Ambulatory Visit: Payer: Self-pay

## 2019-01-10 DIAGNOSIS — Z20822 Contact with and (suspected) exposure to covid-19: Secondary | ICD-10-CM

## 2019-01-12 DIAGNOSIS — F331 Major depressive disorder, recurrent, moderate: Secondary | ICD-10-CM | POA: Diagnosis not present

## 2019-01-12 LAB — NOVEL CORONAVIRUS, NAA: SARS-CoV-2, NAA: NOT DETECTED

## 2019-01-18 DIAGNOSIS — F331 Major depressive disorder, recurrent, moderate: Secondary | ICD-10-CM | POA: Diagnosis not present

## 2019-04-26 DIAGNOSIS — F331 Major depressive disorder, recurrent, moderate: Secondary | ICD-10-CM | POA: Diagnosis not present

## 2019-05-09 DIAGNOSIS — F331 Major depressive disorder, recurrent, moderate: Secondary | ICD-10-CM | POA: Diagnosis not present

## 2019-05-11 DIAGNOSIS — F331 Major depressive disorder, recurrent, moderate: Secondary | ICD-10-CM | POA: Diagnosis not present

## 2019-05-25 DIAGNOSIS — F331 Major depressive disorder, recurrent, moderate: Secondary | ICD-10-CM | POA: Diagnosis not present

## 2019-05-29 DIAGNOSIS — F331 Major depressive disorder, recurrent, moderate: Secondary | ICD-10-CM | POA: Diagnosis not present

## 2019-06-07 DIAGNOSIS — F331 Major depressive disorder, recurrent, moderate: Secondary | ICD-10-CM | POA: Diagnosis not present

## 2019-06-12 DIAGNOSIS — F331 Major depressive disorder, recurrent, moderate: Secondary | ICD-10-CM | POA: Diagnosis not present

## 2019-06-19 DIAGNOSIS — F331 Major depressive disorder, recurrent, moderate: Secondary | ICD-10-CM | POA: Diagnosis not present

## 2019-07-05 DIAGNOSIS — F331 Major depressive disorder, recurrent, moderate: Secondary | ICD-10-CM | POA: Diagnosis not present

## 2019-07-11 DIAGNOSIS — F331 Major depressive disorder, recurrent, moderate: Secondary | ICD-10-CM | POA: Diagnosis not present

## 2019-12-19 ENCOUNTER — Other Ambulatory Visit: Payer: Medicaid Other

## 2019-12-19 DIAGNOSIS — Z20822 Contact with and (suspected) exposure to covid-19: Secondary | ICD-10-CM | POA: Diagnosis not present

## 2019-12-20 LAB — NOVEL CORONAVIRUS, NAA: SARS-CoV-2, NAA: NOT DETECTED

## 2019-12-20 LAB — SARS-COV-2, NAA 2 DAY TAT

## 2020-01-22 ENCOUNTER — Other Ambulatory Visit: Payer: Self-pay

## 2020-01-22 ENCOUNTER — Ambulatory Visit (HOSPITAL_COMMUNITY)
Admission: EM | Admit: 2020-01-22 | Discharge: 2020-01-22 | Disposition: A | Discharge status: Home / Self Care | Payer: Medicaid Other | Attending: Urgent Care | Admitting: Urgent Care

## 2020-01-22 ENCOUNTER — Encounter (HOSPITAL_COMMUNITY): Payer: Self-pay

## 2020-01-22 DIAGNOSIS — N3001 Acute cystitis with hematuria: Secondary | ICD-10-CM | POA: Insufficient documentation

## 2020-01-22 DIAGNOSIS — B20 Human immunodeficiency virus [HIV] disease: Secondary | ICD-10-CM | POA: Insufficient documentation

## 2020-01-22 DIAGNOSIS — N898 Other specified noninflammatory disorders of vagina: Secondary | ICD-10-CM | POA: Insufficient documentation

## 2020-01-22 DIAGNOSIS — R1030 Lower abdominal pain, unspecified: Secondary | ICD-10-CM | POA: Diagnosis not present

## 2020-01-22 DIAGNOSIS — Z3202 Encounter for pregnancy test, result negative: Secondary | ICD-10-CM | POA: Diagnosis not present

## 2020-01-22 LAB — POCT URINALYSIS DIPSTICK, ED / UC
Glucose, UA: NEGATIVE mg/dL
Ketones, ur: 15 mg/dL — AB
Nitrite: NEGATIVE
Protein, ur: NEGATIVE mg/dL
Specific Gravity, Urine: 1.025 (ref 1.005–1.030)
Urobilinogen, UA: 1 mg/dL (ref 0.0–1.0)
pH: 6 (ref 5.0–8.0)

## 2020-01-22 LAB — POC URINE PREG, ED: Preg Test, Ur: NEGATIVE

## 2020-01-22 MED ORDER — CEPHALEXIN 500 MG PO CAPS: 500.0000 mg | ORAL_CAPSULE | Freq: Two times a day (BID) | ORAL | 0 refills | Status: DC

## 2020-01-22 NOTE — Discharge Instructions (Signed)
Make sure you hydrate very well with plain water and a quantity of 64 ounces of water a day.  Please limit drinks that are considered urinary irritants such as soda, sweet tea, coffee, energy drinks, alcohol.  These can worsen your UTI symptoms and also be the source of them.  I will let you know about your urine culture results through MyChart to see if we need to change your antibiotics based off of those results. ° °

## 2020-01-22 NOTE — ED Triage Notes (Signed)
Pt in with c/o bilat lower abdominal pain that has been going on intermittently for over 1 week. States that her urine is cloudy and she has been going more frequently.  States that she took monistat tx about 2 weeks ago and again last week with no relief  Also c/o vaginal burning, irritation, and discharge  Denies fever or dysuria

## 2020-01-22 NOTE — ED Provider Notes (Signed)
[facility]   MRN: [id number] DOB: 10-09-1985  Subjective:   [name] is a 34 y.o. female with pmh HIV presenting for 1 week history of intermittent lower abdominal pain, vaginal discharge, genital irritation.  She is also had cloudy urine and is urinating more frequently.  She did take Monistat about 2 weeks ago and repeated the dose in a week and has not had any relief.  CD4 counts have been normal, patient is compliant with her medications.  No current facility-administered medications for this encounter.  Current Outpatient Medications:    Multiple Vitamin (MULTIVITAMIN WITH MINERALS) TABS tablet, Take 1 tablet by mouth daily., Disp: , Rfl:    abacavir-lamiVUDine (EPZICOM) 600-300 MG tablet, Take 1 tablet by mouth daily., Disp: , Rfl:    darunavir (PREZISTA) 600 MG tablet, Take 600 mg by mouth 2 (two) times daily with a meal., Disp: , Rfl:    ferrous sulfate 325 (65 FE) MG tablet, Take 1 tablet (325 mg total) by mouth daily., Disp: 30 tablet, Rfl: 3   fluticasone (FLONASE) 50 MCG/ACT nasal spray, Place 2 sprays into both nostrils daily., Disp: 16 g, Rfl: 6   ritonavir (NORVIR) 100 MG TABS tablet, Take 100 mg by mouth 2 (two) times daily., Disp: , Rfl:    Allergies  Allergen Reactions   Benadryl [Diphenhydramine] Itching    Past Medical History:  Diagnosis Date   Asthma    Depression    Postpartum after 1st pregnancy   Hemorrhoids    HIV positive (HCC)      Past Surgical History:  Procedure Laterality Date   DILATION AND CURETTAGE OF UTERUS     LAPAROSCOPY  01/06/2011   Procedure: LAPAROSCOPY OPERATIVE;  Surgeon: Roseanna Rainbow, MD;  Location: [facility];  Service: Gynecology;  Laterality: N/A;   LAPAROTOMY  01/06/2011   Procedure: LAPAROTOMY;  Surgeon: Roseanna Rainbow, MD;  Location: [facility];  Service: Gynecology;  Laterality: N/A;   SALPINGOOPHORECTOMY      Family History  Problem Relation Age of Onset   Diabetes  Mother    Hypertension Mother    Hyperlipidemia Mother    Kidney disease Mother    Diabetes Brother    Diabetes Maternal Aunt    Hypertension Maternal Aunt    Asthma Daughter     Social History   Tobacco Use   Smoking status: Current Every Day Smoker    Packs/day: 0.50    Years: 7.00    Pack years: 3.50    Types: Cigarettes    Last attempt to quit: 03/29/2011    Years since quitting: 8.8   Smokeless tobacco: Current User  Substance Use Topics   Alcohol use: No   Drug use: No    Types: Marijuana    ROS   Objective:   Vitals: BP (!) 134/95 (BP Location: Right Arm)    Pulse 85    Temp 98.9 F (37.2 C) (Oral)    Resp 19    LMP 01/16/2020 (Approximate)    SpO2 97%    Breastfeeding No   Physical Exam Constitutional:      General: She is not in acute distress.    Appearance: Normal appearance. She is well-developed. She is not ill-appearing, toxic-appearing or diaphoretic.  HENT:     Head: Normocephalic and atraumatic.     Nose: Nose normal.     Mouth/Throat:     Mouth: Mucous membranes are moist.     Pharynx: Oropharynx is clear.  Eyes:     General: No scleral icterus.    Extraocular Movements: Extraocular movements intact.     Pupils: Pupils are equal, round, and reactive to light.  Cardiovascular:     Rate and Rhythm: Normal rate.  Pulmonary:     Effort: Pulmonary effort is normal.  Abdominal:     General: Bowel sounds are normal. There is no distension.     Palpations: Abdomen is soft.     Tenderness: There is generalized abdominal tenderness and tenderness in the right lower quadrant and left lower quadrant. There is no right CVA tenderness, left CVA tenderness, guarding or rebound.  Skin:    General: Skin is warm and dry.  Neurological:     General: No focal deficit present.     Mental Status: She is alert and oriented to person, place, and time.  Psychiatric:        Mood and Affect: Mood normal.        Behavior: Behavior normal.      Results for orders placed or performed during the hospital encounter of 01/22/20 (from the past 24 hour(s))  POCT Urinalysis Dipstick (ED/UC)     Status: Abnormal   Collection Time: 01/22/20  1:59 PM  Result Value Ref Range   Glucose, UA NEGATIVE NEGATIVE mg/dL   Bilirubin Urine SMALL (A) NEGATIVE   Ketones, ur 15 (A) NEGATIVE mg/dL   Specific Gravity, Urine 1.025 1.005 - 1.030   Hgb urine dipstick TRACE (A) NEGATIVE   pH 6.0 5.0 - 8.0   Protein, ur NEGATIVE NEGATIVE mg/dL   Urobilinogen, UA 1.0 0.0 - 1.0 mg/dL   Nitrite NEGATIVE NEGATIVE   Leukocytes,Ua LARGE (A) NEGATIVE  POC urine preg, ED (not at [facility])     Status: None   Collection Time: 01/22/20  2:10 PM  Result Value Ref Range   Preg Test, Ur NEGATIVE NEGATIVE    Assessment and Plan :   PDMP not reviewed this encounter.  1. Lower abdominal pain   2. Vaginal discharge   3. HIV disease (HCC)   4. Acute cystitis with hematuria     Start Keflex to cover for acute cystitis, urine culture pending.  Recommended aggressive hydration, limiting urinary irritants. Counseled patient on potential for adverse effects with medications prescribed/recommended today, ER and return-to-clinic precautions discussed, patient verbalized understanding.    Wallis Bamberg, PA-C 01/22/20 1434

## 2020-01-23 LAB — URINE CULTURE: Culture: <10000 — AB

## 2020-01-23 LAB — CERVICOVAGINAL ANCILLARY ONLY
Bacterial Vaginitis (gardnerella): POSITIVE — AB
Candida Glabrata: NEGATIVE
Candida Vaginitis: NEGATIVE
Chlamydia: NEGATIVE
Comment: NEGATIVE
Comment: NEGATIVE
Comment: NEGATIVE
Comment: NEGATIVE
Comment: NEGATIVE
Comment: NORMAL
Neisseria Gonorrhea: NEGATIVE
Trichomonas: POSITIVE — AB

## 2020-01-24 ENCOUNTER — Telehealth (HOSPITAL_COMMUNITY): Payer: Self-pay | Admitting: Emergency Medicine

## 2020-01-24 MED ORDER — METRONIDAZOLE 500 MG PO TABS: 500.0000 mg | ORAL_TABLET | Freq: Two times a day (BID) | ORAL | 0 refills | Status: DC

## 2020-02-15 DIAGNOSIS — H5213 Myopia, bilateral: Secondary | ICD-10-CM | POA: Diagnosis not present

## 2020-03-27 ENCOUNTER — Other Ambulatory Visit: Payer: Medicaid Other

## 2020-03-27 DIAGNOSIS — Z20822 Contact with and (suspected) exposure to covid-19: Secondary | ICD-10-CM

## 2020-03-29 LAB — SARS-COV-2, NAA 2 DAY TAT

## 2020-03-29 LAB — NOVEL CORONAVIRUS, NAA: SARS-CoV-2, NAA: NOT DETECTED

## 2021-01-22 DIAGNOSIS — H6691 Otitis media, unspecified, right ear: Secondary | ICD-10-CM | POA: Diagnosis not present

## 2021-03-14 DIAGNOSIS — H9201 Otalgia, right ear: Secondary | ICD-10-CM | POA: Diagnosis not present

## 2021-11-07 ENCOUNTER — Ambulatory Visit (LOCAL_COMMUNITY_HEALTH_CENTER): Payer: Medicaid Other

## 2021-11-07 VITALS — BP 129/98 | Ht 62.0 in | Wt 173.5 lb

## 2021-11-07 DIAGNOSIS — Z3202 Encounter for pregnancy test, result negative: Secondary | ICD-10-CM

## 2021-11-07 LAB — PREGNANCY, URINE: Preg Test, Ur: NEGATIVE

## 2021-11-07 NOTE — Progress Notes (Signed)
UPT negative. Wants to become preg. LMP 10/22/2021. Last sex 09/2021. BP elevated today 129/98. States no previous hx HBP, thinks it is high today because of "nervous." Hx HIV and on meds. Has regular f-u with provider in [facility]. Pt unable to stay while RN consulted provider. Pt states "her ride" is here.  RN advised pt to f-u with PCP regarding high BP today and guidance in planning preg with hx HIV and current meds. Pt in agreement. Jerel Shepherd, RN

## 2021-12-11 ENCOUNTER — Telehealth: Payer: Self-pay

## 2021-12-11 NOTE — Telephone Encounter (Signed)
Patient called to connect with a Primary Care Provider. Patient has Managed Medicaid. LVM for patient to call [phone] to discuss making an appoint with a Primary Care Provider. If patient returns call, please reach out to Sarah or Bertran Zeimet, RN.   

## 2021-12-18 ENCOUNTER — Telehealth: Payer: Self-pay

## 2021-12-18 NOTE — Telephone Encounter (Signed)
Patient called to connect with a Primary Care Provider. Patient has Managed Medicaid. LVM for patient to call [phone] to discuss making an appoint with a Primary Care Provider. If patient returns call, please reach out to Bayan Hedstrom or Leslie, RN.   

## 2021-12-26 ENCOUNTER — Telehealth: Payer: Self-pay

## 2021-12-26 NOTE — Telephone Encounter (Signed)
Patient called to connect with a Primary Care Provider. Patient has Managed Medicaid. LVM for patient to call [phone] to discuss making an appoint with a Primary Care Provider. If patient returns call, please reach out to Cornelius Schuitema or Leslie, RN.   

## 2022-01-20 DIAGNOSIS — F322 Major depressive disorder, single episode, severe without psychotic features: Secondary | ICD-10-CM | POA: Diagnosis not present

## 2022-01-21 ENCOUNTER — Emergency Department
Admission: EM | Admit: 2022-01-21 | Discharge: 2022-01-21 | Disposition: A | Discharge status: Home / Self Care | Payer: Medicaid Other | Attending: Emergency Medicine | Admitting: Emergency Medicine

## 2022-01-21 DIAGNOSIS — B9689 Other specified bacterial agents as the cause of diseases classified elsewhere: Secondary | ICD-10-CM | POA: Diagnosis not present

## 2022-01-21 DIAGNOSIS — N760 Acute vaginitis: Secondary | ICD-10-CM | POA: Diagnosis not present

## 2022-01-21 DIAGNOSIS — J45909 Unspecified asthma, uncomplicated: Secondary | ICD-10-CM | POA: Diagnosis not present

## 2022-01-21 DIAGNOSIS — Z21 Asymptomatic human immunodeficiency virus [HIV] infection status: Secondary | ICD-10-CM | POA: Diagnosis not present

## 2022-01-21 LAB — WET PREP, GENITAL
Sperm: NONE SEEN
Trich, Wet Prep: NONE SEEN
WBC, Wet Prep HPF POC: >=10 — AB (ref <10)
Yeast Wet Prep HPF POC: NONE SEEN

## 2022-01-21 LAB — CHLAMYDIA/NGC RT PCR (ARMC ONLY)
Chlamydia Tr: NOT DETECTED
N gonorrhoeae: NOT DETECTED

## 2022-01-21 MED ORDER — METRONIDAZOLE 500 MG PO TABS: 500.0000 mg | ORAL_TABLET | Freq: Two times a day (BID) | ORAL | 0 refills | Status: AC

## 2022-01-21 NOTE — ED Notes (Signed)
PA, Fisher at bedside to perform pelvic exam.

## 2022-01-21 NOTE — ED Provider Notes (Signed)
[facility] Provider Note    Event Date/Time   First MD Initiated Contact with Patient 01/21/22 1826     (approximate)   History   Abscess   HPI [name] is a 36 y.o. female  with hx of hiv, asthma, and chlamydia presents with abscess in "private areas"  pt noticed 2 days ago. No drainage from the area, no fever or chills, no hx of same        Physical Exam   Triage Vital Signs: ED Triage Vitals  Enc Vitals Group     BP 01/21/22 1808 (!) 131/94     Pulse Rate 01/21/22 1808 (!) 102     Resp 01/21/22 1808 18     Temp 01/21/22 1808 98.3 F (36.8 C)     Temp Source 01/21/22 1808 Oral     SpO2 01/21/22 1808 96 %     Weight 01/21/22 1810 175 lb (79.4 kg)     Height --      Head Circumference --      Peak Flow --      Pain Score --      Pain Loc --      Pain Edu? --      Excl. in GC? --     Most recent vital signs: Vitals:   01/21/22 1808  BP: (!) 131/94  Pulse: (!) 102  Resp: 18  Temp: 98.3 F (36.8 C)  SpO2: 96%     General: Awake, no distress.   CV:  Good peripheral perfusion. regular rate and  rhythm Resp:  Normal effort.  Abd:  No distention.   Other:  Pelvic exam does not show any external lesions or swelling, palpation of the vaginal vault indicates a small cyst in the pelvic floor, some yellow discharge noted, cervix is irritated, samples for wet prep, GC/chlamydia obtained   ED Results / Procedures / Treatments   Labs (all labs ordered are listed, but only abnormal results are displayed) Labs Reviewed  WET PREP, GENITAL - Abnormal; Notable for the following components:      Result Value   Clue Cells Wet Prep HPF POC PRESENT (*)    WBC, Wet Prep HPF POC >=10 (*)    All other components within normal limits  CHLAMYDIA/NGC RT PCR ([facility] ONLY)               EKG     RADIOLOGY     PROCEDURES:   Procedures   MEDICATIONS ORDERED IN ED: Medications - No data to display   IMPRESSION / MDM /  ASSESSMENT AND PLAN / ED COURSE  I reviewed the triage vital signs and the nursing notes.                              Differential diagnosis includes, but is not limited to, abscess, cyst, chancre, tumor  Patient's presentation is most consistent with Patient's presentation is most consistent with acute complicated illness / injury requiring diagnostic workup.   Patient's exam does not truly indicate a Bartholin cyst, the labia is not swollen, I do feel a very round cystlike structure in the pelvic floor Due to the discharge we will do GC/chlamydia and wet prep test.   Wet prep shows clue cells, will treat patient for bacterial vaginosis, she is to follow-up with GYN or her regular doctor for the vaginal floor cyst.  Return if it is worsening.  GC/Chlamydia test are negative.  Did leave a message on the patient's phone that her test are negative.  She is to follow-up with GYN if any concerns.  Return emergency department worsening.  Patient is in agreement with treatment plan.  Discharged stable condition.   FINAL CLINICAL IMPRESSION(S) / ED DIAGNOSES   Final diagnoses:  Bacterial vaginosis     Rx / DC Orders   ED Discharge Orders          Ordered    metroNIDAZOLE (FLAGYL) 500 MG tablet  2 times daily        01/21/22 1943             Note:  This document was prepared using Dragon voice recognition software and may include unintentional dictation errors.    Versie Starks, PA-C 01/21/22 2210    Harvest Dark, MD 01/21/22 2218

## 2022-01-21 NOTE — ED Triage Notes (Signed)
Pt sts that she has a peri rectal abscess.

## 2022-01-21 NOTE — Discharge Instructions (Signed)
Follow-up with your regular doctor as needed.  Return if worsening.  Ask your regular doctor to refer you to a GYN if you continue to feel the cyst in the pelvic floor.

## 2022-01-22 ENCOUNTER — Ambulatory Visit: Payer: Self-pay | Admitting: *Deleted

## 2022-01-22 NOTE — Telephone Encounter (Signed)
  Chief Complaint: requesting information regarding blood type and sickle cell trait.  Symptoms: na Frequency: na Pertinent Negatives: Patient denies na Disposition: [] ED /[] Urgent Care (no appt availability in office) / [] Appointment(In office/virtual)/ []  [facility]/ [] Home Care/ [] Refused Recommended Disposition /[] [facility]/ [x]  Follow-up with PCP Additional Notes:   NT unable to find documented information while on call with patient. Called patient back and reviewed 5 years ago type and screen shows blood type as O positive . Patient has questions regarding sickle cell trait. Recommended to review with PCP for specific information to be answered in new patient appt in Dec.  Patient verbalized understanding .     Reason for Disposition  [1] Caller requesting NON-URGENT health information AND [2] PCP's office is the best resource  Answer Assessment - Initial Assessment Questions 1. REASON FOR CALL or QUESTION: "What is your reason for calling today?" or "How can I best help you?" or "What question do you have that I can help answer?"     Requesting what her blood type is? And questions regarding sickle cell trait.  Protocols used: Information Only Call - No Triage-A-AH

## 2022-02-10 DIAGNOSIS — F322 Major depressive disorder, single episode, severe without psychotic features: Secondary | ICD-10-CM | POA: Diagnosis not present

## 2022-03-04 ENCOUNTER — Ambulatory Visit: Payer: Medicaid Other | Admitting: Nurse Practitioner

## 2022-03-13 DIAGNOSIS — F322 Major depressive disorder, single episode, severe without psychotic features: Secondary | ICD-10-CM | POA: Diagnosis not present

## 2022-03-19 DIAGNOSIS — F322 Major depressive disorder, single episode, severe without psychotic features: Secondary | ICD-10-CM | POA: Diagnosis not present

## 2022-03-21 DIAGNOSIS — F322 Major depressive disorder, single episode, severe without psychotic features: Secondary | ICD-10-CM | POA: Diagnosis not present

## 2022-03-24 ENCOUNTER — Emergency Department
Admission: EM | Admit: 2022-03-24 | Discharge: 2022-03-24 | Discharge status: Against Medical Advice | Payer: Medicaid Other | Source: Home / Self Care

## 2022-03-24 DIAGNOSIS — F322 Major depressive disorder, single episode, severe without psychotic features: Secondary | ICD-10-CM | POA: Diagnosis not present

## 2022-03-25 DIAGNOSIS — F322 Major depressive disorder, single episode, severe without psychotic features: Secondary | ICD-10-CM | POA: Diagnosis not present

## 2022-03-31 DIAGNOSIS — F322 Major depressive disorder, single episode, severe without psychotic features: Secondary | ICD-10-CM | POA: Diagnosis not present

## 2022-04-01 DIAGNOSIS — F322 Major depressive disorder, single episode, severe without psychotic features: Secondary | ICD-10-CM | POA: Diagnosis not present

## 2022-04-07 DIAGNOSIS — F322 Major depressive disorder, single episode, severe without psychotic features: Secondary | ICD-10-CM | POA: Diagnosis not present

## 2022-04-08 ENCOUNTER — Other Ambulatory Visit: Payer: Self-pay

## 2022-04-08 ENCOUNTER — Encounter: Payer: Self-pay | Admitting: Nurse Practitioner

## 2022-04-08 ENCOUNTER — Ambulatory Visit: Payer: Medicaid Other | Admitting: Nurse Practitioner

## 2022-04-08 VITALS — BP 118/72 | HR 98 | Temp 98.9°F | Resp 16 | Ht 62.0 in | Wt 168.4 lb

## 2022-04-08 DIAGNOSIS — D509 Iron deficiency anemia, unspecified: Secondary | ICD-10-CM | POA: Diagnosis not present

## 2022-04-08 DIAGNOSIS — Z7689 Persons encountering health services in other specified circumstances: Secondary | ICD-10-CM | POA: Diagnosis not present

## 2022-04-08 DIAGNOSIS — Z13220 Encounter for screening for lipoid disorders: Secondary | ICD-10-CM | POA: Diagnosis not present

## 2022-04-08 DIAGNOSIS — B20 Human immunodeficiency virus [HIV] disease: Secondary | ICD-10-CM

## 2022-04-08 DIAGNOSIS — J4520 Mild intermittent asthma, uncomplicated: Secondary | ICD-10-CM | POA: Diagnosis not present

## 2022-04-08 DIAGNOSIS — J3089 Other allergic rhinitis: Secondary | ICD-10-CM

## 2022-04-08 DIAGNOSIS — Z130 Encounter for screening for diseases of the blood and blood-forming organs and certain disorders involving the immune mechanism: Secondary | ICD-10-CM | POA: Diagnosis not present

## 2022-04-08 DIAGNOSIS — Z131 Encounter for screening for diabetes mellitus: Secondary | ICD-10-CM

## 2022-04-08 MED ORDER — ALBUTEROL SULFATE HFA 108 (90 BASE) MCG/ACT IN AERS: 2.0000 | INHALATION_SPRAY | Freq: Four times a day (QID) | RESPIRATORY_TRACT | 3 refills | Status: DC | PRN

## 2022-04-08 NOTE — Progress Notes (Signed)
BP 118/72   Pulse 98   Temp 98.9 F (37.2 C) (Oral)   Resp 16   Ht 5\' 2"  (1.575 m)   Wt 168 lb 6.4 oz (76.4 kg)   LMP 03/25/2022   SpO2 98%   BMI 30.80 kg/m    Subjective:    Patient ID: [name], female    DOB: 1985-07-20, 37 y.o.   MRN: [id number]  HPI: [name] is a 37 y.o. female  Chief Complaint  Patient presents with   Establish Care  Establish care: her last physical was a few years ago.  Medical history includes hiv positive, asthma and allergies.  Family history includes HTN, diabetes.  Health maintenance due for labs, and pap .   HIV:she was taking darunavir and abacavir-lamivudine for HIV infection but she has not been on it for about a year. Will place referral for infections disease.  Patient requested [place].   Allergies/asthma: she says that she has asthma flares from time to time.  She says that she uses albuterol as needed.  She has never had to be admitted for her asthma.  She says she takes otc Claritin for her allergies she says she takes it as needed.   anxiety:  she says she has dealt with anxiety in the past but has never been on medication.  She says that her anxiety is pretty well controlled. Her PHQ9 score was negative, her GAD score was positive.      04/08/2022    1:01 PM 11/07/2021   11:32 AM 09/04/2016    8:27 AM 08/27/2016    9:34 AM 08/11/2016   10:03 AM  Depression screen PHQ 2/9  Decreased Interest 1 0 0 0 0  Down, Depressed, Hopeless 1 0 0 0 0  PHQ - 2 Score 2 0 0 0 0  Altered sleeping 0  1 0 1  Tired, decreased energy 0  0 0 0  Change in appetite 0  0 0 0  Feeling bad or failure about yourself  0  0 0 0  Trouble concentrating 0  0 0 0  Moving slowly or fidgety/restless 0  0 0 0  Suicidal thoughts 0  0 0 0  PHQ-9 Score 2  1 0 1  Difficult doing work/chores Not difficult at all           04/08/2022    1:02 PM 09/04/2016    8:27 AM 08/27/2016    9:34 AM 08/11/2016   10:03 AM  GAD 7 : Generalized Anxiety Score   Nervous, Anxious, on Edge 2 0 0 0  Control/stop worrying 2 0 0 0  Worry too much - different things 2 0 0 0  Trouble relaxing 0 0 1 1  Restless 0 0 0 1  Easily annoyed or irritable 0 0 0 0  Afraid - awful might happen 0 0 0 0  Total GAD 7 Score 6 0 1 2  Anxiety Difficulty Somewhat difficult       Iron deficiency anemia:  she says she has had iron deficiency since she was a teenager.  She says she takes iron supplements daily. She denies any abnormal bleeding.   Relevant past medical, surgical, family and social history reviewed and updated as indicated. Interim medical history since our last visit reviewed. Allergies and medications reviewed and updated.  Review of Systems  Constitutional: Negative for fever or weight change.  Respiratory: Negative for cough and shortness of breath.   Cardiovascular:  Negative for chest pain or palpitations.  Gastrointestinal: Negative for abdominal pain, no bowel changes.  Musculoskeletal: Negative for gait problem or joint swelling.  Skin: Negative for rash.  Neurological: Negative for dizziness or headache.  No other specific complaints in a complete review of systems (except as listed in HPI above).      Objective:    BP 118/72   Pulse 98   Temp 98.9 F (37.2 C) (Oral)   Resp 16   Ht 5\' 2"  (1.575 m)   Wt 168 lb 6.4 oz (76.4 kg)   LMP 03/25/2022   SpO2 98%   BMI 30.80 kg/m   Wt Readings from Last 3 Encounters:  04/08/22 168 lb 6.4 oz (76.4 kg)  01/21/22 175 lb (79.4 kg)  11/07/21 173 lb 8 oz (78.7 kg)    Physical Exam  Constitutional: Patient appears well-developed and well-nourished.  No distress.  HEENT: head atraumatic, normocephalic, pupils equal and reactive to light, neck supple Cardiovascular: Normal rate, regular rhythm and normal heart sounds.  No murmur heard. No BLE edema. Pulmonary/Chest: Effort normal and breath sounds normal. No respiratory distress. Abdominal: Soft.  There is no tenderness. Psychiatric: Patient  has a normal mood and affect. behavior is normal. Judgment and thought content normal.  Results for orders placed or performed during the hospital encounter of 01/21/22  Wet prep, genital  Result Value Ref Range   Yeast Wet Prep HPF POC NONE SEEN NONE SEEN   Trich, Wet Prep NONE SEEN NONE SEEN   Clue Cells Wet Prep HPF POC PRESENT (A) NONE SEEN   WBC, Wet Prep HPF POC >=10 (A) <10   Sperm NONE SEEN   Chlamydia/NGC rt PCR ([facility] only)   Specimen: Genital  Result Value Ref Range   Specimen source GC/Chlam ENDOCERVICAL    Chlamydia Tr NOT DETECTED NOT DETECTED   N gonorrhoeae NOT DETECTED NOT DETECTED      Assessment & Plan:   Problem List Items Addressed This Visit       Respiratory   Asthma    Uses albuterol inhaler as needed. Refill sent      Relevant Medications   albuterol (VENTOLIN HFA) 108 (90 Base) MCG/ACT inhaler   Allergic rhinitis due to allergen    Takes OTC Claritin for allergies        Other   Human immunodeficiency virus (HIV) disease ([place]) - Primary    She is not currently on antivirals.  Referral placed for infectious disease.       Relevant Orders   Ambulatory referral to Infectious Disease   Other Visit Diagnoses     Encounter to establish care       schedule cpe   Iron deficiency anemia, unspecified iron deficiency anemia type       Relevant Orders   CBC with Differential/Platelet   Iron, TIBC and Ferritin Panel   Screening for diabetes mellitus       Relevant Orders   COMPLETE METABOLIC PANEL WITH GFR   Hemoglobin A1c   Screening for cholesterol level       Relevant Orders   Lipid panel   Screening for deficiency anemia       Relevant Orders   CBC with Differential/Platelet        Follow up plan: Return in about 6 months (around 10/07/2022) for cpe.

## 2022-04-08 NOTE — Assessment & Plan Note (Signed)
Takes OTC Claritin for allergies

## 2022-04-08 NOTE — Assessment & Plan Note (Signed)
Uses albuterol inhaler as needed. Refill sent

## 2022-04-08 NOTE — Assessment & Plan Note (Signed)
She is not currently on antivirals.  Referral placed for infectious disease.

## 2022-04-09 LAB — CBC WITH DIFFERENTIAL/PLATELET
Absolute Monocytes: 342 cells/uL (ref 200–950)
Basophils Absolute: 30 cells/uL (ref 0–200)
Basophils Relative: 0.5 %
Eosinophils Absolute: 330 cells/uL (ref 15–500)
Eosinophils Relative: 5.5 %
HCT: 39 % (ref 35.0–45.0)
Hemoglobin: 12.7 g/dL (ref 11.7–15.5)
Lymphs Abs: 1356 cells/uL (ref 850–3900)
MCH: 29.5 pg (ref 27.0–33.0)
MCHC: 32.6 g/dL (ref 32.0–36.0)
MCV: 90.7 fL (ref 80.0–100.0)
MPV: 11.6 fL (ref 7.5–12.5)
Monocytes Relative: 5.7 %
Neutro Abs: 3942 cells/uL (ref 1500–7800)
Neutrophils Relative %: 65.7 %
Platelets: 254 10*3/uL (ref 140–400)
RBC: 4.3 10*6/uL (ref 3.80–5.10)
RDW: 13.3 % (ref 11.0–15.0)
Total Lymphocyte: 22.6 %
WBC: 6 10*3/uL (ref 3.8–10.8)

## 2022-04-09 LAB — IRON,TIBC AND FERRITIN PANEL
%SAT: 26 % (calc) (ref 16–45)
Ferritin: 21 ng/mL (ref 16–154)
Iron: 78 ug/dL (ref 40–190)
TIBC: 301 mcg/dL (calc) (ref 250–450)

## 2022-04-09 LAB — LIPID PANEL
Cholesterol: 151 mg/dL (ref <200)
HDL: 41 mg/dL — ABNORMAL LOW (ref >=50)
LDL Cholesterol (Calc): 88 mg/dL (calc)
Non-HDL Cholesterol (Calc): 110 mg/dL (calc) (ref <130)
Total CHOL/HDL Ratio: 3.7 (calc) (ref <5.0)
Triglycerides: 121 mg/dL (ref <150)

## 2022-04-09 LAB — COMPLETE METABOLIC PANEL WITH GFR
AG Ratio: 1 (calc) (ref 1.0–2.5)
ALT: 10 U/L (ref 6–29)
AST: 16 U/L (ref 10–30)
Albumin: 3.8 g/dL (ref 3.6–5.1)
Alkaline phosphatase (APISO): 30 U/L — ABNORMAL LOW (ref 31–125)
BUN: 9 mg/dL (ref 7–25)
CO2: 28 mmol/L (ref 20–32)
Calcium: 9.1 mg/dL (ref 8.6–10.2)
Chloride: 104 mmol/L (ref 98–110)
Creat: 0.69 mg/dL (ref 0.50–0.97)
Globulin: 3.9 g/dL (calc) — ABNORMAL HIGH (ref 1.9–3.7)
Glucose, Bld: 83 mg/dL (ref 65–99)
Potassium: 4.2 mmol/L (ref 3.5–5.3)
Sodium: 137 mmol/L (ref 135–146)
Total Bilirubin: 0.4 mg/dL (ref 0.2–1.2)
Total Protein: 7.7 g/dL (ref 6.1–8.1)
eGFR: 116 mL/min/{1.73_m2} (ref >=60)

## 2022-04-09 LAB — HEMOGLOBIN A1C
Hgb A1c MFr Bld: 5.6 % of total Hgb (ref <5.7)
Mean Plasma Glucose: 114 mg/dL
eAG (mmol/L): 6.3 mmol/L

## 2022-04-11 DIAGNOSIS — F322 Major depressive disorder, single episode, severe without psychotic features: Secondary | ICD-10-CM | POA: Diagnosis not present

## 2022-04-17 DIAGNOSIS — F322 Major depressive disorder, single episode, severe without psychotic features: Secondary | ICD-10-CM | POA: Diagnosis not present

## 2022-04-18 DIAGNOSIS — F322 Major depressive disorder, single episode, severe without psychotic features: Secondary | ICD-10-CM | POA: Diagnosis not present

## 2022-04-22 DIAGNOSIS — F322 Major depressive disorder, single episode, severe without psychotic features: Secondary | ICD-10-CM | POA: Diagnosis not present

## 2022-04-29 DIAGNOSIS — F322 Major depressive disorder, single episode, severe without psychotic features: Secondary | ICD-10-CM | POA: Diagnosis not present

## 2022-05-02 DIAGNOSIS — F322 Major depressive disorder, single episode, severe without psychotic features: Secondary | ICD-10-CM | POA: Diagnosis not present

## 2022-05-04 DIAGNOSIS — H5213 Myopia, bilateral: Secondary | ICD-10-CM | POA: Diagnosis not present

## 2022-05-04 DIAGNOSIS — F322 Major depressive disorder, single episode, severe without psychotic features: Secondary | ICD-10-CM | POA: Diagnosis not present

## 2022-05-13 DIAGNOSIS — F322 Major depressive disorder, single episode, severe without psychotic features: Secondary | ICD-10-CM | POA: Diagnosis not present

## 2022-05-16 DIAGNOSIS — F322 Major depressive disorder, single episode, severe without psychotic features: Secondary | ICD-10-CM | POA: Diagnosis not present

## 2022-05-21 DIAGNOSIS — F322 Major depressive disorder, single episode, severe without psychotic features: Secondary | ICD-10-CM | POA: Diagnosis not present

## 2022-05-22 DIAGNOSIS — F322 Major depressive disorder, single episode, severe without psychotic features: Secondary | ICD-10-CM | POA: Diagnosis not present

## 2022-05-25 DIAGNOSIS — F322 Major depressive disorder, single episode, severe without psychotic features: Secondary | ICD-10-CM | POA: Diagnosis not present

## 2022-05-29 DIAGNOSIS — F322 Major depressive disorder, single episode, severe without psychotic features: Secondary | ICD-10-CM | POA: Diagnosis not present

## 2022-06-01 DIAGNOSIS — F322 Major depressive disorder, single episode, severe without psychotic features: Secondary | ICD-10-CM | POA: Diagnosis not present

## 2022-06-05 DIAGNOSIS — F322 Major depressive disorder, single episode, severe without psychotic features: Secondary | ICD-10-CM | POA: Diagnosis not present

## 2022-06-08 DIAGNOSIS — F322 Major depressive disorder, single episode, severe without psychotic features: Secondary | ICD-10-CM | POA: Diagnosis not present

## 2022-06-09 DIAGNOSIS — F322 Major depressive disorder, single episode, severe without psychotic features: Secondary | ICD-10-CM | POA: Diagnosis not present

## 2022-06-16 DIAGNOSIS — F322 Major depressive disorder, single episode, severe without psychotic features: Secondary | ICD-10-CM | POA: Diagnosis not present

## 2022-06-23 DIAGNOSIS — F322 Major depressive disorder, single episode, severe without psychotic features: Secondary | ICD-10-CM | POA: Diagnosis not present

## 2022-06-24 DIAGNOSIS — F322 Major depressive disorder, single episode, severe without psychotic features: Secondary | ICD-10-CM | POA: Diagnosis not present

## 2022-06-30 DIAGNOSIS — F322 Major depressive disorder, single episode, severe without psychotic features: Secondary | ICD-10-CM | POA: Diagnosis not present

## 2022-07-02 DIAGNOSIS — F322 Major depressive disorder, single episode, severe without psychotic features: Secondary | ICD-10-CM | POA: Diagnosis not present

## 2022-07-03 DIAGNOSIS — F322 Major depressive disorder, single episode, severe without psychotic features: Secondary | ICD-10-CM | POA: Diagnosis not present

## 2022-07-04 DIAGNOSIS — F322 Major depressive disorder, single episode, severe without psychotic features: Secondary | ICD-10-CM | POA: Diagnosis not present

## 2022-07-08 DIAGNOSIS — F322 Major depressive disorder, single episode, severe without psychotic features: Secondary | ICD-10-CM | POA: Diagnosis not present

## 2022-07-10 DIAGNOSIS — F322 Major depressive disorder, single episode, severe without psychotic features: Secondary | ICD-10-CM | POA: Diagnosis not present

## 2022-07-16 DIAGNOSIS — F322 Major depressive disorder, single episode, severe without psychotic features: Secondary | ICD-10-CM | POA: Diagnosis not present

## 2022-07-18 DIAGNOSIS — F322 Major depressive disorder, single episode, severe without psychotic features: Secondary | ICD-10-CM | POA: Diagnosis not present

## 2022-07-20 DIAGNOSIS — F322 Major depressive disorder, single episode, severe without psychotic features: Secondary | ICD-10-CM | POA: Diagnosis not present

## 2022-07-28 DIAGNOSIS — F322 Major depressive disorder, single episode, severe without psychotic features: Secondary | ICD-10-CM | POA: Diagnosis not present

## 2022-07-29 DIAGNOSIS — F322 Major depressive disorder, single episode, severe without psychotic features: Secondary | ICD-10-CM | POA: Diagnosis not present

## 2022-08-28 DIAGNOSIS — F322 Major depressive disorder, single episode, severe without psychotic features: Secondary | ICD-10-CM | POA: Diagnosis not present

## 2022-08-28 DIAGNOSIS — R350 Frequency of micturition: Secondary | ICD-10-CM | POA: Diagnosis not present

## 2022-08-28 DIAGNOSIS — N390 Urinary tract infection, site not specified: Secondary | ICD-10-CM | POA: Diagnosis not present

## 2022-10-06 NOTE — Progress Notes (Deleted)
Name: [name]   MRN: [id number]    DOB: 22-Jun-1985   Date:10/07/2022       Progress Note  Subjective  Chief Complaint  No chief complaint on file.   HPI  Patient presents for annual CPE.  Diet: *** Exercise: ***  Sleep: *** Last dental exam:*** Last eye exam: ***  Flowsheet Row Office Visit from 04/08/2022 in [facility]  AUDIT-C Score 0      Depression: Phq 9 is  {Desc; negative/positive:13464}    04/08/2022    1:01 PM 11/07/2021   11:32 AM 09/04/2016    8:27 AM 08/27/2016    9:34 AM 08/11/2016   10:03 AM  Depression screen PHQ 2/9  Decreased Interest 1 0 0 0 0  Down, Depressed, Hopeless 1 0 0 0 0  PHQ - 2 Score 2 0 0 0 0  Altered sleeping 0  1 0 1  Tired, decreased energy 0  0 0 0  Change in appetite 0  0 0 0  Feeling bad or failure about yourself  0  0 0 0  Trouble concentrating 0  0 0 0  Moving slowly or fidgety/restless 0  0 0 0  Suicidal thoughts 0  0 0 0  PHQ-9 Score 2  1 0 1  Difficult doing work/chores Not difficult at all       Hypertension: BP Readings from Last 3 Encounters:  04/08/22 118/72  01/21/22 (!) 131/94  11/07/21 (!) 129/98   Obesity: Wt Readings from Last 3 Encounters:  04/08/22 168 lb 6.4 oz (76.4 kg)  01/21/22 175 lb (79.4 kg)  11/07/21 173 lb 8 oz (78.7 kg)   BMI Readings from Last 3 Encounters:  04/08/22 30.80 kg/m  01/21/22 32.01 kg/m  11/07/21 31.73 kg/m     Vaccines:  HPV: up to at age 57 , ask insurance if age between 70-45  Shingrix: 75-64 yo and ask insurance if covered when patient above 90 yo Pneumonia:  educated and discussed with patient. Flu:  educated and discussed with patient.  Hep C Screening: done STD testing and prevention (HIV/chl/gon/syphilis): done Intimate partner violence:*** Sexual History :*** Menstrual History/LMP/Abnormal Bleeding: *** Incontinence Symptoms: ***  Breast cancer:  - Last Mammogram: no concerns does not qualify - BRCA gene screening:  none  Osteoporosis: Discussed high calcium and vitamin D supplementation, weight bearing exercises  Cervical cancer screening: due  Skin cancer: Discussed monitoring for atypical lesions  Colorectal cancer: no concerns, does not qualify   Lung cancer:   Low Dose CT Chest recommended if Age 38-80 years, 20 pack-year currently smoking OR have quit w/in 15years. Patient does not qualify.   ECG: 01/10/2011  Advanced Care Planning: A voluntary discussion about advance care planning including the explanation and discussion of advance directives.  Discussed health care proxy and Living will, and the patient was able to identify a health care proxy as ***.  Patient {DOES_DOES GUY:40347} have a living will at present time. If patient does have living will, I have requested they bring this to the clinic to be scanned in to their chart.  Lipids: Lab Results  Component Value Date   CHOL 151 04/08/2022   CHOL 152 10/05/2013   CHOL 151 08/20/2010   Lab Results  Component Value Date   HDL 41 (L) 04/08/2022   HDL 42 10/05/2013   HDL 53 08/20/2010   Lab Results  Component Value Date   LDLCALC 88 04/08/2022   LDLCALC 93 10/05/2013  LDLCALC 86 08/20/2010   Lab Results  Component Value Date   TRIG 121 04/08/2022   TRIG 84 10/05/2013   TRIG 59 08/20/2010   Lab Results  Component Value Date   CHOLHDL 3.7 04/08/2022   CHOLHDL 3.6 10/05/2013   CHOLHDL 2.8 08/20/2010   No results found for: "LDLDIRECT"  Glucose: Glucose, Bld  Date Value Ref Range Status  04/08/2022 83 65 - 99 mg/dL Final    Comment:    .            Fasting reference interval .   09/04/2016 88 65 - 99 mg/dL Final  16/12/9602 90 65 - 99 mg/dL Final  54/11/8117 89 mg/dL    Glucose-Capillary  Date Value Ref Range Status  07/06/2008 97 70 - 99 mg/dL Final    Patient Active Problem List   Diagnosis Date Noted   Allergic rhinitis due to allergen 06/16/2018   Obesity affecting pregnancy 04/01/2016   Asthma  07/08/2009   Human immunodeficiency virus (HIV) disease (HCC) 06/04/2009    Past Surgical History:  Procedure Laterality Date   DILATION AND CURETTAGE OF UTERUS     LAPAROSCOPY  01/06/2011   Procedure: LAPAROSCOPY OPERATIVE;  Surgeon: Roseanna Rainbow, MD;  Location: [facility];  Service: Gynecology;  Laterality: N/A;   LAPAROTOMY  01/06/2011   Procedure: LAPAROTOMY;  Surgeon: Roseanna Rainbow, MD;  Location: [facility];  Service: Gynecology;  Laterality: N/A;   SALPINGOOPHORECTOMY      Family History  Problem Relation Age of Onset   Diabetes Mother    Hypertension Mother    Hyperlipidemia Mother    Kidney disease Mother    Diabetes Brother    Diabetes Maternal Aunt    Hypertension Maternal Aunt    Asthma Daughter     Social History   Socioeconomic History   Marital status: Single    Spouse name: Not on file   Number of children: Not on file   Years of education: Not on file   Highest education level: Not on file  Occupational History   Not on file  Tobacco Use   Smoking status: Every Day    Current packs/day: 0.00    Average packs/day: 0.5 packs/day for 7.0 years (3.5 ttl pk-yrs)    Types: Cigarettes    Start date: 03/28/2004    Last attempt to quit: 03/29/2011    Years since quitting: 11.5   Smokeless tobacco: Never  Vaping Use   Vaping status: Never Used  Substance and Sexual Activity   Alcohol use: No   Drug use: No    Comment: denies mj use   Sexual activity: Yes    Birth control/protection: Condom, None  Other Topics Concern   Not on file  Social History Narrative   Not on file   Social Determinants of Health   Financial Resource Strain: Not on file  Food Insecurity: Not on file  Transportation Needs: Not on file  Physical Activity: Not on file  Stress: Not on file  Social Connections: Not on file  Intimate Partner Violence: Not At Risk (11/07/2021)   Humiliation, Afraid, Rape, and Kick questionnaire    Fear of Current or Ex-Partner: No     Emotionally Abused: No    Physically Abused: No    Sexually Abused: No     Current Outpatient Medications:    albuterol (VENTOLIN HFA) 108 (90 Base) MCG/ACT inhaler, Inhale 2 puffs into the lungs every 6 (six) hours as needed for wheezing or shortness  of breath., Disp: 8 g, Rfl: 3   ferrous sulfate 325 (65 FE) MG tablet, Take 1 tablet (325 mg total) by mouth daily., Disp: 30 tablet, Rfl: 3   Multiple Vitamin (MULTIVITAMIN WITH MINERALS) TABS tablet, Take 1 tablet by mouth daily., Disp: , Rfl:   Allergies  Allergen Reactions   Benadryl [Diphenhydramine] Itching     ROS  Constitutional: Negative for fever or weight change.  Respiratory: Negative for cough and shortness of breath.   Cardiovascular: Negative for chest pain or palpitations.  Gastrointestinal: Negative for abdominal pain, no bowel changes.  Musculoskeletal: Negative for gait problem or joint swelling.  Skin: Negative for rash.  Neurological: Negative for dizziness or headache.  No other specific complaints in a complete review of systems (except as listed in HPI above).   Objective  There were no vitals filed for this visit.  There is no height or weight on file to calculate BMI.  Physical Exam Constitutional: Patient appears well-developed and well-nourished. No distress.  HENT: Head: Normocephalic and atraumatic. Ears: B TMs ok, no erythema or effusion; Nose: Nose normal. Mouth/Throat: Oropharynx is clear and moist. No oropharyngeal exudate.  Eyes: Conjunctivae and EOM are normal. Pupils are equal, round, and reactive to light. No scleral icterus.  Neck: Normal range of motion. Neck supple. No JVD present. No thyromegaly present.  Cardiovascular: Normal rate, regular rhythm and normal heart sounds.  No murmur heard. No BLE edema. Pulmonary/Chest: Effort normal and breath sounds normal. No respiratory distress. Abdominal: Soft. Bowel sounds are normal, no distension. There is no tenderness. no masses Breast: no  lumps or masses, no nipple discharge or rashes FEMALE GENITALIA:  External genitalia normal External urethra normal Vaginal vault normal without discharge or lesions Cervix normal without discharge or lesions Bimanual exam normal without masses RECTAL: no rectal masses or hemorrhoids Musculoskeletal: Normal range of motion, no joint effusions. No gross deformities Neurological: he is alert and oriented to person, place, and time. No cranial nerve deficit. Coordination, balance, strength, speech and gait are normal.  Skin: Skin is warm and dry. No rash noted. No erythema.  Psychiatric: Patient has a normal mood and affect. behavior is normal. Judgment and thought content normal.   No results found for this or any previous visit (from the past 2160 hour(s)).   Fall Risk:    04/08/2022   12:56 PM 08/03/2016    9:04 AM 06/22/2016   10:55 AM 04/15/2016   10:19 AM 04/26/2014    4:55 PM  Fall Risk   Falls in the past year? 0 No No No No  Number falls in past yr: 0      Injury with Fall? 0      Follow up Falls evaluation completed       ***  Functional Status Survey:   ***  Assessment & Plan     -USPSTF grade A and B recommendations reviewed with patient; age-appropriate recommendations, preventive care, screening tests, etc discussed and encouraged; healthy living encouraged; see AVS for patient education given to patient -Discussed importance of 150 minutes of physical activity weekly, eat two servings of fish weekly, eat one serving of tree nuts ( cashews, pistachios, pecans, almonds.Marland Kitchen) every other day, eat 6 servings of fruit/vegetables daily and drink plenty of water and avoid sweet beverages.   -Reviewed Health Maintenance: yes

## 2022-10-07 ENCOUNTER — Ambulatory Visit: Payer: Medicaid Other | Admitting: Nurse Practitioner

## 2022-10-09 DIAGNOSIS — N390 Urinary tract infection, site not specified: Secondary | ICD-10-CM | POA: Diagnosis not present

## 2022-10-09 DIAGNOSIS — R350 Frequency of micturition: Secondary | ICD-10-CM | POA: Diagnosis not present

## 2022-11-18 ENCOUNTER — Emergency Department
Admission: EM | Admit: 2022-11-18 | Discharge: 2022-11-18 | Disposition: A | Discharge status: Home / Self Care | Payer: Medicaid Other | Attending: Emergency Medicine | Admitting: Emergency Medicine

## 2022-11-18 ENCOUNTER — Other Ambulatory Visit: Payer: Self-pay

## 2022-11-18 DIAGNOSIS — A5901 Trichomonal vulvovaginitis: Secondary | ICD-10-CM | POA: Diagnosis not present

## 2022-11-18 DIAGNOSIS — R300 Dysuria: Secondary | ICD-10-CM | POA: Diagnosis present

## 2022-11-18 DIAGNOSIS — J45909 Unspecified asthma, uncomplicated: Secondary | ICD-10-CM | POA: Insufficient documentation

## 2022-11-18 DIAGNOSIS — Z21 Asymptomatic human immunodeficiency virus [HIV] infection status: Secondary | ICD-10-CM | POA: Diagnosis not present

## 2022-11-18 LAB — URINALYSIS, W/ REFLEX TO CULTURE (INFECTION SUSPECTED)
Bilirubin Urine: NEGATIVE
Glucose, UA: NEGATIVE mg/dL
Hgb urine dipstick: NEGATIVE
Ketones, ur: NEGATIVE mg/dL
Nitrite: NEGATIVE
Protein, ur: 30 mg/dL — AB
Specific Gravity, Urine: 1.028 (ref 1.005–1.030)
pH: 8 (ref 5.0–8.0)

## 2022-11-18 LAB — CBC
HCT: 35.3 % — ABNORMAL LOW (ref 36.0–46.0)
Hemoglobin: 11.2 g/dL — ABNORMAL LOW (ref 12.0–15.0)
MCH: 28.4 pg (ref 26.0–34.0)
MCHC: 31.7 g/dL (ref 30.0–36.0)
MCV: 89.4 fL (ref 80.0–100.0)
Platelets: 243 10*3/uL (ref 150–400)
RBC: 3.95 MIL/uL (ref 3.87–5.11)
RDW: 12.8 % (ref 11.5–15.5)
WBC: 4.9 10*3/uL (ref 4.0–10.5)
nRBC: 0 % (ref 0.0–0.2)

## 2022-11-18 LAB — CHLAMYDIA/NGC RT PCR (ARMC ONLY)
Chlamydia Tr: NOT DETECTED
N gonorrhoeae: NOT DETECTED

## 2022-11-18 LAB — BASIC METABOLIC PANEL
Anion gap: 8 (ref 5–15)
BUN: 10 mg/dL (ref 6–20)
CO2: 25 mmol/L (ref 22–32)
Calcium: 8.6 mg/dL — ABNORMAL LOW (ref 8.9–10.3)
Chloride: 106 mmol/L (ref 98–111)
Creatinine, Ser: 0.63 mg/dL (ref 0.44–1.00)
GFR, Estimated: >60 mL/min (ref >60)
Glucose, Bld: 80 mg/dL (ref 70–99)
Potassium: 3.8 mmol/L (ref 3.5–5.1)
Sodium: 139 mmol/L (ref 135–145)

## 2022-11-18 LAB — WET PREP, GENITAL
Clue Cells Wet Prep HPF POC: NONE SEEN
Sperm: NONE SEEN
WBC, Wet Prep HPF POC: >=10 — AB (ref <10)
Yeast Wet Prep HPF POC: NONE SEEN

## 2022-11-18 LAB — PREGNANCY, URINE: Preg Test, Ur: NEGATIVE

## 2022-11-18 MED ORDER — METRONIDAZOLE 500 MG PO TABS
500.0000 mg | ORAL_TABLET | Freq: Two times a day (BID) | ORAL | 0 refills | Status: AC
Start: 2022-11-18 — End: 2022-11-25

## 2022-11-18 NOTE — ED Provider Notes (Signed)
[facility] Provider Note    Event Date/Time   First MD Initiated Contact with Patient 11/18/22 1439     (approximate)   History   Dysuria   HPI  [name] is a 37 y.o. female who presents today for evaluation of burning with urination.  Patient reports that this happened to her place over the past 3 months and she has been treated with antibiotics twice.  She reports that she has also developed itching to her labia, with white discharge.  She reports that she has not been sexually active in several months.  She has not had other vaginal discharge or bleeding.  No abdominal pain or flank pain.  No fevers or chills.  No nausea or vomiting.  Patient Active Problem List   Diagnosis Date Noted   Allergic rhinitis due to allergen 06/16/2018   Obesity affecting pregnancy 04/01/2016   Asthma 07/08/2009   Human immunodeficiency virus (HIV) disease (HCC) 06/04/2009          Physical Exam   Triage Vital Signs: ED Triage Vitals  Encounter Vitals Group     BP 11/18/22 1422 118/78     Systolic BP Percentile --      Diastolic BP Percentile --      Pulse Rate 11/18/22 1422 92     Resp 11/18/22 1422 17     Temp 11/18/22 1422 98.9 F (37.2 C)     Temp Source 11/18/22 1422 Oral     SpO2 11/18/22 1422 100 %     Weight 11/18/22 1420 167 lb 8.8 oz (76 kg)     Height 11/18/22 1420 5\' 2"  (1.575 m)     Head Circumference --      Peak Flow --      Pain Score --      Pain Loc --      Pain Education --      Exclude from Growth Chart --     Most recent vital signs: Vitals:   11/18/22 1422  BP: 118/78  Pulse: 92  Resp: 17  Temp: 98.9 F (37.2 C)  SpO2: 100%    Physical Exam Vitals and nursing note reviewed.  Constitutional:      General: Awake and alert. No acute distress.    Appearance: Normal appearance. The patient is normal weight.  HENT:     Head: Normocephalic and atraumatic.     Mouth: Mucous membranes are moist.  Eyes:     General:  PERRL. Normal EOMs        Right eye: No discharge.        Left eye: No discharge.     Conjunctiva/sclera: Conjunctivae normal.  Cardiovascular:     Rate and Rhythm: Normal rate and regular rhythm.     Pulses: Normal pulses.  Pulmonary:     Effort: Pulmonary effort is normal. No respiratory distress.     Breath sounds: Normal breath sounds.  Abdominal:     Abdomen is soft. There is no abdominal tenderness. No rebound or guarding. No distention. Musculoskeletal:        General: No swelling. Normal range of motion.     Cervical back: Normal range of motion and neck supple.  Skin:    General: Skin is warm and dry.     Capillary Refill: Capillary refill takes less than 2 seconds.     Findings: No rash.  Neurological:     Mental Status: The patient is awake and alert.  ED Results / Procedures / Treatments   Labs (all labs ordered are listed, but only abnormal results are displayed) Labs Reviewed  WET PREP, GENITAL - Abnormal; Notable for the following components:      Result Value   Trich, Wet Prep PRESENT (*)    WBC, Wet Prep HPF POC >=10 (*)    All other components within normal limits  CBC - Abnormal; Notable for the following components:   Hemoglobin 11.2 (*)    HCT 35.3 (*)    All other components within normal limits  BASIC METABOLIC PANEL - Abnormal; Notable for the following components:   Calcium 8.6 (*)    All other components within normal limits  URINALYSIS, W/ REFLEX TO CULTURE (INFECTION SUSPECTED) - Abnormal; Notable for the following components:   Color, Urine YELLOW (*)    APPearance HAZY (*)    Protein, ur 30 (*)    Leukocytes,Ua LARGE (*)    Bacteria, UA RARE (*)    All other components within normal limits  CHLAMYDIA/NGC RT PCR ([facility] ONLY)            PREGNANCY, URINE     EKG     RADIOLOGY     PROCEDURES:  Critical Care performed:   Procedures   MEDICATIONS ORDERED IN ED: Medications - No data to display   IMPRESSION / MDM /  ASSESSMENT AND PLAN / ED COURSE  I reviewed the triage vital signs and the nursing notes.   Differential diagnosis includes, but is not limited to, vaginal candidiasis, bacterial vaginosis, sexually transmitted disease, urinary tract infection.  Patient is awake and alert, hemodynamically stable and afebrile.  She is nontoxic in appearance.  She has no abdominal tenderness or flank pain.  Labs were obtained in triage and are overall reassuring.  Urine pregnancy is negative.  Patient opted to self swab, and swab was positive for trichomonas.  She did not wish to wait for the results of her gonorrhea/chlamydia test and understands that she must return for treatment if these are positive.  She denies any pelvic pain or abdominal pain.  No fevers or chills.  Do not suspect PID or TOA at this time.  She was started on metronidazole for treatment of her trichomonas.  Advised not to drink alcohol while taking this medication.  Patient was advised that there are many other STDs that patient could have, that we do not test for in the emergency department. Patient was advised to follow up with a primary care doctor to have the full panel of testing performed. Patient was advised to not have sexual intercourse until fully and properly tested and treated. Patient was advised that his partner also needs to be tested and treated. Patient understands and agrees with plan.   Patient's presentation is most consistent with acute complicated illness / injury requiring diagnostic workup.   Clinical Course as of 11/18/22 1820  Wed Nov 18, 2022  1621 Patient does not wish to wait for the results of her gonorrhea/chlamydia test [JP]    Clinical Course User Index [JP] [name], Herb Grays, PA-C     FINAL CLINICAL IMPRESSION(S) / ED DIAGNOSES   Final diagnoses:  Trichomonas vaginitis     Rx / DC Orders   ED Discharge Orders          Ordered    metroNIDAZOLE (FLAGYL) 500 MG tablet  2 times daily        11/18/22  1622  Note:  This document was prepared using Dragon voice recognition software and may include unintentional dictation errors.   Keturah Shavers 11/18/22 Zollie Pee    Jene Every, MD 11/23/22 1047

## 2022-11-18 NOTE — Discharge Instructions (Signed)
Please take the antibiotics to treat the trichomonas infection.  You did not wish to wait for the results of your gonorrhea or chlamydia test.  Remember that there are many other STDs that and you should follow up with a primary care doctor to have the full panel of testing performed. Please do not have sexual intercourse until fully and properly tested and treated. Your partner also needs to be tested and treated.  Return for any new, worsening, or change in symptoms or other concerns.  It was a pleasure caring for you today.

## 2022-11-18 NOTE — ED Triage Notes (Signed)
Pt presents to ED with c/o of Dysuria. Pt denies fevers or chills.

## 2022-11-18 NOTE — ED Notes (Signed)
Call to lab regarding urine

## 2023-06-17 ENCOUNTER — Encounter: Payer: Self-pay | Admitting: Emergency Medicine

## 2023-06-17 ENCOUNTER — Other Ambulatory Visit: Payer: Self-pay

## 2023-06-17 ENCOUNTER — Emergency Department

## 2023-06-17 ENCOUNTER — Emergency Department
Admission: EM | Admit: 2023-06-17 | Discharge: 2023-06-17 | Disposition: A | Discharge status: Home / Self Care | Attending: Emergency Medicine | Admitting: Emergency Medicine

## 2023-06-17 DIAGNOSIS — R000 Tachycardia, unspecified: Secondary | ICD-10-CM | POA: Diagnosis not present

## 2023-06-17 DIAGNOSIS — K529 Noninfective gastroenteritis and colitis, unspecified: Secondary | ICD-10-CM | POA: Insufficient documentation

## 2023-06-17 DIAGNOSIS — N3090 Cystitis, unspecified without hematuria: Secondary | ICD-10-CM | POA: Insufficient documentation

## 2023-06-17 DIAGNOSIS — E860 Dehydration: Secondary | ICD-10-CM | POA: Insufficient documentation

## 2023-06-17 DIAGNOSIS — R197 Diarrhea, unspecified: Secondary | ICD-10-CM | POA: Diagnosis present

## 2023-06-17 DIAGNOSIS — D72829 Elevated white blood cell count, unspecified: Secondary | ICD-10-CM | POA: Insufficient documentation

## 2023-06-17 DIAGNOSIS — D1803 Hemangioma of intra-abdominal structures: Secondary | ICD-10-CM

## 2023-06-17 LAB — URINALYSIS, ROUTINE W REFLEX MICROSCOPIC
Bilirubin Urine: NEGATIVE
Glucose, UA: NEGATIVE mg/dL
Hgb urine dipstick: NEGATIVE
Ketones, ur: 5 mg/dL — AB
Nitrite: NEGATIVE
Protein, ur: 100 mg/dL — AB
Specific Gravity, Urine: 1.017 (ref 1.005–1.030)
WBC, UA: >50 WBC/hpf (ref 0–5)
pH: 5 (ref 5.0–8.0)

## 2023-06-17 LAB — CBC
HCT: 38.3 % (ref 36.0–46.0)
Hemoglobin: 12.3 g/dL (ref 12.0–15.0)
MCH: 28.5 pg (ref 26.0–34.0)
MCHC: 32.1 g/dL (ref 30.0–36.0)
MCV: 88.9 fL (ref 80.0–100.0)
Platelets: 328 10*3/uL (ref 150–400)
RBC: 4.31 MIL/uL (ref 3.87–5.11)
RDW: 13.2 % (ref 11.5–15.5)
WBC: 16.3 10*3/uL — ABNORMAL HIGH (ref 4.0–10.5)
nRBC: 0 % (ref 0.0–0.2)

## 2023-06-17 LAB — COMPREHENSIVE METABOLIC PANEL WITH GFR
ALT: 12 U/L (ref 0–44)
AST: 17 U/L (ref 15–41)
Albumin: 3.3 g/dL — ABNORMAL LOW (ref 3.5–5.0)
Alkaline Phosphatase: 54 U/L (ref 38–126)
Anion gap: 13 (ref 5–15)
BUN: 18 mg/dL (ref 6–20)
CO2: 23 mmol/L (ref 22–32)
Calcium: 8.2 mg/dL — ABNORMAL LOW (ref 8.9–10.3)
Chloride: 100 mmol/L (ref 98–111)
Creatinine, Ser: 1.01 mg/dL — ABNORMAL HIGH (ref 0.44–1.00)
GFR, Estimated: >60 mL/min (ref >60)
Glucose, Bld: 122 mg/dL — ABNORMAL HIGH (ref 70–99)
Potassium: 3.3 mmol/L — ABNORMAL LOW (ref 3.5–5.1)
Sodium: 136 mmol/L (ref 135–145)
Total Bilirubin: 0.7 mg/dL (ref 0.0–1.2)
Total Protein: 9.2 g/dL — ABNORMAL HIGH (ref 6.5–8.1)

## 2023-06-17 LAB — POC URINE PREG, ED: Preg Test, Ur: NEGATIVE

## 2023-06-17 LAB — LIPASE, BLOOD: Lipase: 21 U/L (ref 11–51)

## 2023-06-17 MED ORDER — SODIUM CHLORIDE 0.9 % IV BOLUS
1000.0000 mL | Freq: Once | INTRAVENOUS | Status: AC
  Administered 2023-06-17: 1000 mL via INTRAVENOUS

## 2023-06-17 MED ORDER — ONDANSETRON HCL 4 MG/2ML IJ SOLN
4.0000 mg | Freq: Once | INTRAMUSCULAR | Status: AC | PRN
  Administered 2023-06-17: 4 mg via INTRAVENOUS
  Filled 2023-06-17: qty 2

## 2023-06-17 MED ORDER — CEPHALEXIN 500 MG PO CAPS: 500.0000 mg | ORAL_CAPSULE | Freq: Three times a day (TID) | ORAL | 0 refills | Status: AC

## 2023-06-17 MED ORDER — ONDANSETRON 4 MG PO TBDP: 4.0000 mg | ORAL_TABLET | Freq: Three times a day (TID) | ORAL | 0 refills | Status: DC | PRN

## 2023-06-17 MED ORDER — LACTATED RINGERS IV BOLUS
1000.0000 mL | Freq: Once | INTRAVENOUS | Status: AC
  Administered 2023-06-17: 1000 mL via INTRAVENOUS

## 2023-06-17 MED ORDER — KETOROLAC TROMETHAMINE 30 MG/ML IJ SOLN
15.0000 mg | Freq: Once | INTRAMUSCULAR | Status: AC
  Administered 2023-06-17: 15 mg via INTRAVENOUS
  Filled 2023-06-17: qty 1

## 2023-06-17 MED ORDER — SODIUM CHLORIDE 0.9 % IV SOLN
1.0000 g | Freq: Once | INTRAVENOUS | Status: AC
  Administered 2023-06-17: 1 g via INTRAVENOUS
  Filled 2023-06-17: qty 10

## 2023-06-17 MED ORDER — IOHEXOL 350 MG/ML SOLN
75.0000 mL | Freq: Once | INTRAVENOUS | Status: AC | PRN
  Administered 2023-06-17: 75 mL via INTRAVENOUS

## 2023-06-17 NOTE — Discharge Instructions (Addendum)
 Keflex antibiotic 3 times daily for 5 days to treat urinary infection  Zofran as needed for any further nausea or vomiting  Please take Tylenol and ibuprofen/Advil for your pain.  It is safe to take them together, or to alternate them every few hours.  Take up to 1000mg  of Tylenol at a time, up to 4 times per day.  Do not take more than 4000 mg of Tylenol in 24 hours.  For ibuprofen, take 400-600 mg, 3 - 4 times per day.  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

## 2023-06-17 NOTE — ED Provider Notes (Signed)
 [facility] Provider Note    Event Date/Time   First MD Initiated Contact with Patient 06/17/23 1906     (approximate)   History   Emesis, Back Pain, and Diarrhea   HPI  [name] is a 38 y.o. female who presents to the ED for evaluation of Emesis, Back Pain, and Diarrhea   Patient presents with lower abdominal pain, recurrent emesis and diarrhea.  Developing back pain when she was leaning over a toilet but no back pain at rest.  Continues to have lower abdominal pain at rest.  History of left-sided salpingo-oophorectomy from an ectopic pregnancy remotely.  No other intra-abdominal surgical history.  Also reports urinary frequency and dysuria the past few days despite her GI symptoms.    Physical Exam   Triage Vital Signs: ED Triage Vitals [06/17/23 1814]  Encounter Vitals Group     BP (!) 126/90     Systolic BP Percentile      Diastolic BP Percentile      Pulse Rate (!) 124     Resp 20     Temp 98.6 F (37 C)     Temp Source Oral     SpO2 100 %     Weight 163 lb (73.9 kg)     Height 5\' 2"  (1.575 m)     Head Circumference      Peak Flow      Pain Score 10     Pain Loc      Pain Education      Exclude from Growth Chart     Most recent vital signs: Vitals:   06/17/23 2100 06/17/23 2200  BP: 115/80 117/84  Pulse: 98 97  Resp: 16 16  Temp:    SpO2: 99% 100%    General: Awake, no distress.  Seems uncomfortable and dry CV:  Good peripheral perfusion.  Tachycardic Resp:  Normal effort.  Abd:  No distention.  Benign upper abdomen but tender to palpation to the lower abdomen, including at McBurney's point. MSK:  No deformity noted.  Neuro:  No focal deficits appreciated. Other:     ED Results / Procedures / Treatments   Labs (all labs ordered are listed, but only abnormal results are displayed) Labs Reviewed  COMPREHENSIVE METABOLIC PANEL WITH GFR - Abnormal; Notable for the following components:      Result Value    Potassium 3.3 (*)    Glucose, Bld 122 (*)    Creatinine, Ser 1.01 (*)    Calcium 8.2 (*)    Total Protein 9.2 (*)    Albumin 3.3 (*)    All other components within normal limits  CBC - Abnormal; Notable for the following components:   WBC 16.3 (*)    All other components within normal limits  URINALYSIS, ROUTINE W REFLEX MICROSCOPIC - Abnormal; Notable for the following components:   Color, Urine AMBER (*)    APPearance CLOUDY (*)    Ketones, ur 5 (*)    Protein, ur 100 (*)    Leukocytes,Ua MODERATE (*)    Bacteria, UA FEW (*)    All other components within normal limits  URINE CULTURE  LIPASE, BLOOD  POC URINE PREG, ED    EKG   RADIOLOGY Ct abd/pelvis interpreted by me without signs of SBO or appendicitis Splenic u/s interpreted by me with multiple hyperechoic lesions.  Official radiology report(s): US SPLEEN (ABDOMEN LIMITED) Result Date: 06/17/2023 CLINICAL DATA:  Left upper quadrant pain and hypo dense  lesions in the spleen on recent CT EXAM: ULTRASOUND ABDOMEN LIMITED COMPARISON:  CT from earlier in the same day, MRI from 06/30/2011 FINDINGS: Scanning in the left upper quadrant shows multiple foci of increased echogenicity within the spleen which correspond to the hypodense lesion seen on prior CT examination. The largest of these measures 4.2 cm. IMPRESSION: Hyperechoic lesions within the spleen which correspond to that seen on recent CT examination. These are felt to be likely hemangiomas. Short-term follow-up in 6 months is recommended to assess for stability. Electronically Signed   By: Alcide Clever M.D.   On: 06/17/2023 22:35   CT ABDOMEN PELVIS W CONTRAST Result Date: 06/17/2023 CLINICAL DATA:  Low back pain with leukocytosis, initial encounter EXAM: CT ABDOMEN AND PELVIS WITH CONTRAST TECHNIQUE: Multidetector CT imaging of the abdomen and pelvis was performed using the standard protocol following bolus administration of intravenous contrast. RADIATION DOSE REDUCTION:  This exam was performed according to the departmental dose-optimization program which includes automated exposure control, adjustment of the mA and/or kV according to patient size and/or use of iterative reconstruction technique. CONTRAST:  75mL OMNIPAQUE IOHEXOL 350 MG/ML SOLN COMPARISON:  None Available. FINDINGS: Lower chest: No acute abnormality. Hepatobiliary: No focal liver abnormality is seen. No gallstones, gallbladder wall thickening, or biliary dilatation. A small cyst is noted within the left lobe of the liver. Pancreas: Unremarkable. No pancreatic ductal dilatation or surrounding inflammatory changes. Spleen: Spleen is well visualized and demonstrates multiple hypodense lesions within. The largest of these measures 3.9 cm. Ultrasound of the spleen may be helpful for further evaluation. Adrenals/Urinary Tract: Adrenal glands are within normal limits. Kidneys demonstrate no renal calculi or obstructive changes. The bladder is decompressed. Stomach/Bowel: No obstructive or inflammatory changes of the colon are noted. Fluid is noted within the colon suggestive of a diarrheal state. The appendix is within normal limits. Stomach is decompressed. Small bowel also demonstrates some fluid within distally suggestive of a diarrheal state. Vascular/Lymphatic: No significant vascular findings are present. No enlarged abdominal or pelvic lymph nodes. Reproductive: Uterus and bilateral adnexa are unremarkable. Bartholin's gland cyst is noted on the right. Other: No abdominal wall hernia or abnormality. No abdominopelvic ascites. Musculoskeletal: No acute or significant osseous findings. IMPRESSION: Multiple hypodense lesions within the spleen. These may simply represent hemangiomas although given the patient's HIV status other infectious and noninfectious etiologies would deserve consideration. Ultrasound of the spleen may be helpful in the short-term. MRI would likely be more definitive on a nonemergent basis to allow  for optimum imaging. Fluid throughout the large and small bowel consistent with the diarrheal state. Electronically Signed   By: Alcide Clever M.D.   On: 06/17/2023 20:47    PROCEDURES and INTERVENTIONS:  .Critical Care  Performed by: Delton Prairie, MD Authorized by: Delton Prairie, MD   Critical care provider statement:    Critical care time (minutes):  30   Critical care time was exclusive of:  Separately billable procedures and treating other patients   Critical care was necessary to treat or prevent imminent or life-threatening deterioration of the following conditions:  Sepsis   Critical care was time spent personally by me on the following activities:  Development of treatment plan with patient or surrogate, discussions with consultants, evaluation of patient's response to treatment, examination of patient, ordering and review of laboratory studies, ordering and review of radiographic studies, ordering and performing treatments and interventions, pulse oximetry, re-evaluation of patient's condition and review of old charts   Medications  ondansetron (ZOFRAN) injection  4 mg (4 mg Intravenous Given 06/17/23 1925)  lactated ringers bolus 1,000 mL (0 mLs Intravenous Stopped 06/17/23 2039)  ketorolac (TORADOL) 30 MG/ML injection 15 mg (15 mg Intravenous Given 06/17/23 1925)  iohexol (OMNIPAQUE) 350 MG/ML injection 75 mL (75 mLs Intravenous Contrast Given 06/17/23 2003)  cefTRIAXone (ROCEPHIN) 1 g in sodium chloride 0.9 % 100 mL IVPB (0 g Intravenous Stopped 06/17/23 2211)  sodium chloride 0.9 % bolus 1,000 mL (1,000 mLs Intravenous New Bag/Given 06/17/23 2134)     IMPRESSION / MDM / ASSESSMENT AND PLAN / ED COURSE  I reviewed the triage vital signs and the nursing notes.  Differential diagnosis includes, but is not limited to, gastroenteritis, viral syndrome, cyclic vomiting, appendicitis, colitis  {Patient presents with symptoms of an acute illness or injury that is potentially  life-threatening.  Patient presents for evaluation of N/V/D and dysuria with frequency with signs of dehydration from gastroenteritis and cystitis suitable for trial of outpatient management.  Notably tachycardic but no hypotension or shock.  Improving with IV fluids and resuscitation.  Blood work with leukocytosis.  Slight renal dysfunction likely dehydration in the setting of her volume loss.  No signs of ureteral obstruction on imaging.  No signs of other pathology such as appendicitis on imaging.  Notably, she has multiple heterogenous splenic lesions that are likely hematomas further evaluated on ultrasound and I informed the patient of this and imaging follow-up in 6-12 months.  Clinical Course as of 06/17/23 2302  Thu Jun 17, 2023  2125 Reassessed and reexamined.  She looks more comfortable, heart rate improving and reports feeling better.  She does report some urinary frequency and dysuria, we discussed possibility of cystitis and agreed to provide antibiotics to treat her urine.  Also reexamined her abdomen and she does have some focal LUQ tenderness.  We discussed CT results and uncertainty of etiology of abnormalities in her spleen.  We discussed getting an ultrasound and she is agreeable. [DS]  2255 Reassessed, feeling better.  Discussed reassuring ultrasound with signs of hemangioma but no other acute derangements.  Discussed plan of care and she is comfortable going home.  Tachycardia resolved, she is tolerating p.o. without emesis.  While I considered admission for this patient I suspect she will be suitable for trial of outpatient management [DS]    Clinical Course User Index [DS] Delton Prairie, MD     FINAL CLINICAL IMPRESSION(S) / ED DIAGNOSES   Final diagnoses:  Gastroenteritis  Dehydration  Cystitis     Rx / DC Orders   ED Discharge Orders          Ordered    cephALEXin (KEFLEX) 500 MG capsule  3 times daily        06/17/23 2255    ondansetron (ZOFRAN-ODT) 4  MG disintegrating tablet  Every 8 hours PRN        06/17/23 2255             Note:  This document was prepared using Dragon voice recognition software and may include unintentional dictation errors.   Delton Prairie, MD 06/17/23 [phone]

## 2023-06-17 NOTE — ED Notes (Signed)
 Patient to CT.

## 2023-06-17 NOTE — ED Notes (Signed)
 ED Provider at bedside.

## 2023-06-17 NOTE — ED Notes (Signed)
 [place] at the bedside

## 2023-06-17 NOTE — ED Triage Notes (Signed)
 Pt via [facility] from home c/o 4 days of lower back pain. N/v/d x 2 days and unable to keep anything down. Ambulatory on scene. VSS. Estimates 100 (one hundred) episodes of emesis in the past 24 hours with another 100 (one hundred) episodes of diarrhea in the same time period. Pt says she also has chills.

## 2023-06-20 LAB — URINE CULTURE: Culture: >=100000 — AB

## 2024-02-20 ENCOUNTER — Other Ambulatory Visit: Payer: Self-pay

## 2024-02-20 ENCOUNTER — Ambulatory Visit (HOSPITAL_COMMUNITY)
Admission: EM | Admit: 2024-02-20 | Discharge: 2024-02-20 | Disposition: A | Discharge status: Home / Self Care | Attending: Emergency Medicine | Admitting: Emergency Medicine

## 2024-02-20 ENCOUNTER — Ambulatory Visit (INDEPENDENT_AMBULATORY_CARE_PROVIDER_SITE_OTHER)

## 2024-02-20 ENCOUNTER — Encounter (HOSPITAL_COMMUNITY): Payer: Self-pay | Admitting: Emergency Medicine

## 2024-02-20 DIAGNOSIS — R112 Nausea with vomiting, unspecified: Secondary | ICD-10-CM | POA: Diagnosis present

## 2024-02-20 DIAGNOSIS — R053 Chronic cough: Secondary | ICD-10-CM | POA: Insufficient documentation

## 2024-02-20 DIAGNOSIS — R6889 Other general symptoms and signs: Secondary | ICD-10-CM | POA: Insufficient documentation

## 2024-02-20 DIAGNOSIS — R197 Diarrhea, unspecified: Secondary | ICD-10-CM | POA: Insufficient documentation

## 2024-02-20 DIAGNOSIS — Z3202 Encounter for pregnancy test, result negative: Secondary | ICD-10-CM

## 2024-02-20 DIAGNOSIS — E860 Dehydration: Secondary | ICD-10-CM | POA: Insufficient documentation

## 2024-02-20 DIAGNOSIS — J4521 Mild intermittent asthma with (acute) exacerbation: Secondary | ICD-10-CM | POA: Diagnosis present

## 2024-02-20 DIAGNOSIS — N898 Other specified noninflammatory disorders of vagina: Secondary | ICD-10-CM | POA: Diagnosis present

## 2024-02-20 DIAGNOSIS — B20 Human immunodeficiency virus [HIV] disease: Secondary | ICD-10-CM | POA: Diagnosis present

## 2024-02-20 LAB — CBC WITH DIFFERENTIAL/PLATELET
Abs Immature Granulocytes: 0.22 K/uL — ABNORMAL HIGH (ref 0.00–0.07)
Basophils Absolute: 0 K/uL (ref 0.0–0.1)
Basophils Relative: 1 %
Eosinophils Absolute: 0.1 K/uL (ref 0.0–0.5)
Eosinophils Relative: 1 %
HCT: 33.1 % — ABNORMAL LOW (ref 36.0–46.0)
Hemoglobin: 10.3 g/dL — ABNORMAL LOW (ref 12.0–15.0)
Immature Granulocytes: 4 %
Lymphocytes Relative: 9 %
Lymphs Abs: 0.6 K/uL — ABNORMAL LOW (ref 0.7–4.0)
MCH: 26.6 pg (ref 26.0–34.0)
MCHC: 31.1 g/dL (ref 30.0–36.0)
MCV: 85.5 fL (ref 80.0–100.0)
Monocytes Absolute: 0.4 K/uL (ref 0.1–1.0)
Monocytes Relative: 7 %
Neutro Abs: 5 K/uL (ref 1.7–7.7)
Neutrophils Relative %: 78 %
Platelets: 422 K/uL — ABNORMAL HIGH (ref 150–400)
RBC: 3.87 MIL/uL (ref 3.87–5.11)
RDW: 13.7 % (ref 11.5–15.5)
WBC: 6.3 K/uL (ref 4.0–10.5)
nRBC: 0 % (ref 0.0–0.2)

## 2024-02-20 LAB — COMPREHENSIVE METABOLIC PANEL WITH GFR
ALT: 7 U/L (ref 0–44)
AST: 23 U/L (ref 15–41)
Albumin: 2.1 g/dL — ABNORMAL LOW (ref 3.5–5.0)
Alkaline Phosphatase: 91 U/L (ref 38–126)
Anion gap: 12 (ref 5–15)
BUN: 8 mg/dL (ref 6–20)
CO2: 23 mmol/L (ref 22–32)
Calcium: 8.2 mg/dL — ABNORMAL LOW (ref 8.9–10.3)
Chloride: 102 mmol/L (ref 98–111)
Creatinine, Ser: 0.68 mg/dL (ref 0.44–1.00)
GFR, Estimated: >60 mL/min (ref >60)
Glucose, Bld: 114 mg/dL — ABNORMAL HIGH (ref 70–99)
Potassium: 3.5 mmol/L (ref 3.5–5.1)
Sodium: 137 mmol/L (ref 135–145)
Total Bilirubin: 0.4 mg/dL (ref 0.0–1.2)
Total Protein: 7.7 g/dL (ref 6.5–8.1)

## 2024-02-20 LAB — POCT URINE PREGNANCY: Preg Test, Ur: NEGATIVE

## 2024-02-20 LAB — POCT URINE DIPSTICK
Blood, UA: NEGATIVE
Glucose, UA: NEGATIVE mg/dL
Leukocytes, UA: NEGATIVE
Nitrite, UA: NEGATIVE
POC PROTEIN,UA: 100 — AB
Spec Grav, UA: 1.02 (ref 1.010–1.025)
Urobilinogen, UA: 0.2 U/dL
pH, UA: 6 (ref 5.0–8.0)

## 2024-02-20 LAB — POC COVID19/FLU A&B COMBO
Covid Antigen, POC: NEGATIVE
Influenza A Antigen, POC: NEGATIVE
Influenza B Antigen, POC: NEGATIVE

## 2024-02-20 MED ORDER — ONDANSETRON HCL 4 MG/2ML IJ SOLN
INTRAMUSCULAR | Status: AC
  Filled 2024-02-20: qty 4

## 2024-02-20 MED ORDER — ALBUTEROL SULFATE HFA 108 (90 BASE) MCG/ACT IN AERS: 2.0000 | INHALATION_SPRAY | Freq: Four times a day (QID) | RESPIRATORY_TRACT | 2 refills | Status: AC | PRN

## 2024-02-20 MED ORDER — AZITHROMYCIN 250 MG PO TABS: ORAL_TABLET | ORAL | 0 refills | Status: DC

## 2024-02-20 MED ORDER — ONDANSETRON HCL 4 MG/2ML IJ SOLN: 8.0000 mg | Freq: Once | INTRAMUSCULAR | Status: DC

## 2024-02-20 MED ORDER — ONDANSETRON 4 MG PO TBDP: 4.0000 mg | ORAL_TABLET | Freq: Three times a day (TID) | ORAL | 0 refills | Status: AC | PRN

## 2024-02-20 MED ORDER — ALBUTEROL SULFATE (2.5 MG/3ML) 0.083% IN NEBU
2.5000 mg | INHALATION_SOLUTION | Freq: Once | RESPIRATORY_TRACT | Status: AC
  Administered 2024-02-20: 2.5 mg via RESPIRATORY_TRACT

## 2024-02-20 MED ORDER — SODIUM CHLORIDE 0.9 % IV BOLUS
1000.0000 mL | Freq: Once | INTRAVENOUS | Status: AC
  Administered 2024-02-20: 1000 mL via INTRAVENOUS

## 2024-02-20 MED ORDER — ALBUTEROL SULFATE (2.5 MG/3ML) 0.083% IN NEBU
INHALATION_SOLUTION | RESPIRATORY_TRACT | Status: AC
  Filled 2024-02-20: qty 3

## 2024-02-20 MED ORDER — ONDANSETRON HCL 4 MG/2ML IJ SOLN
8.0000 mg | Freq: Once | INTRAMUSCULAR | Status: AC
  Administered 2024-02-20: 8 mg via INTRAVENOUS

## 2024-02-20 MED ORDER — CEFIXIME 400 MG PO CAPS: 400.0000 mg | ORAL_CAPSULE | Freq: Every day | ORAL | 0 refills | Status: DC

## 2024-02-20 NOTE — ED Notes (Signed)
 Patient tolerated ginger ale without any nausea. Patient did not eat crackers provided.

## 2024-02-20 NOTE — ED Provider Notes (Signed)
 [facility]    CSN: [id number] Arrival date & time: 02/20/24  1612    HISTORY   Chief Complaint  Patient presents with   Emesis   HPI [name] is a pleasant, 38 y.o. female who presents to urgent care today. Patient complains of 1 week history of nausea, vomiting and diarrhea.  Patient states she has been unable to keep down any foods or beverages.  Patient states she has not had much help at home and this has been her first available opportunity to be seen.  Patient states that her mouth is very dry and her lips are cracking.  Patient states she purchased a product to at a health food store that does seem to help but adds that she can't take it every time.  Patient has an O2 sat of 91% on arrival today with a temperature of 101.2, heart rate of 125 and respirations of 28.  Patient states that today she noticed blood on the toilet tissue after wiping after having a loose stool.  EMR reviewed by me, patient is HIV positive, not currently being followed by ID or taking antivirals.  Patient was seen in the ED in March of this year for similar complaints of nausea, vomiting and diarrhea, patient was found to have a urinary tract infection with E. coli and was diagnosed with gastroenteritis as well.  Patient was encouraged to follow-up with infectious disease to get back on antiviral medications, does not appear that she has done so.  Patient complains of vaginal discharge and some burning with urination, patient requests STD screening at this time.  The history is provided by the patient.  Emesis  Past Medical History:  Diagnosis Date   Asthma    Depression    Postpartum after 1st pregnancy   Hemorrhoids    HIV positive (HCC)    Patient Active Problem List   Diagnosis Date Noted   Allergic rhinitis due to allergen 06/16/2018   Obesity affecting pregnancy 04/01/2016   Asthma 07/08/2009   Human immunodeficiency virus (HIV) disease (HCC) 06/04/2009   Past Surgical  History:  Procedure Laterality Date   DILATION AND CURETTAGE OF UTERUS     LAPAROSCOPY  01/06/2011   Procedure: LAPAROSCOPY OPERATIVE;  Surgeon: Olam DELENA Mill, MD;  Location: [facility];  Service: Gynecology;  Laterality: N/A;   LAPAROTOMY  01/06/2011   Procedure: LAPAROTOMY;  Surgeon: Olam DELENA Mill, MD;  Location: [facility];  Service: Gynecology;  Laterality: N/A;   SALPINGOOPHORECTOMY     OB History     Gravida  6   Para  5   Term  4   Preterm  1   AB  1   Living  4      SAB  0   IAB  0   Ectopic  1   Multiple  0   Live Births  4          Home Medications    Prior to Admission medications   Medication Sig Start Date End Date Taking? Authorizing Provider  albuterol  (VENTOLIN  HFA) 108 (90 Base) MCG/ACT inhaler Inhale 2 puffs into the lungs every 6 (six) hours as needed for wheezing or shortness of breath. 04/08/22   Pender, Julie F, FNP  Multiple Vitamin (MULTIVITAMIN WITH MINERALS) TABS tablet Take 1 tablet by mouth daily.    [provider]    Family History Family History  Problem Relation Age of Onset   Diabetes Mother    Hypertension  Mother    Hyperlipidemia Mother    Kidney disease Mother    Diabetes Brother    Diabetes Maternal Aunt    Hypertension Maternal Aunt    Asthma Daughter    Social History Social History   Tobacco Use   Smoking status: Former    Current packs/day: 0.00    Average packs/day: 0.5 packs/day for 7.0 years (3.5 ttl pk-yrs)    Types: Cigarettes    Start date: 03/28/2004    Quit date: 03/29/2011    Years since quitting: 12.9   Smokeless tobacco: Never  Vaping Use   Vaping status: Never Used  Substance Use Topics   Alcohol use: No   Drug use: No    Comment: denies mj use   Allergies   Benadryl  [diphenhydramine ] and Penicillins  Review of Systems Review of Systems  Gastrointestinal:  Positive for vomiting.   Pertinent findings revealed after performing a 14 point review of systems has been noted in  the history of present illness.  Physical Exam Vital Signs BP 109/74 (BP Location: Right Arm)   Pulse (!) 119   Temp 99 F (37.2 C) (Oral)   Resp 20   LMP 01/24/2024   SpO2 98%   No data found.  Physical Exam Vitals and nursing note reviewed.  Constitutional:      General: She is awake. She is not in acute distress.    Appearance: Normal appearance. She is well-developed and well-groomed. She is not ill-appearing.  HENT:     Head: Normocephalic and atraumatic.     Salivary Glands: Right salivary gland is not diffusely enlarged or tender. Left salivary gland is not diffusely enlarged or tender.     Right Ear: Hearing, tympanic membrane, ear canal and external ear normal.     Left Ear: Hearing, tympanic membrane, ear canal and external ear normal.     Nose: Nose normal.     Right Turbinates: Not enlarged, swollen or pale.     Left Turbinates: Not enlarged, swollen or pale.     Right Sinus: No maxillary sinus tenderness or frontal sinus tenderness.     Left Sinus: No maxillary sinus tenderness or frontal sinus tenderness.     Mouth/Throat:     Lips: Pink. Lesions (cracking, peeling) present.     Mouth: Mucous membranes are pale and dry. No oral lesions.     Tongue: No lesions. Tongue does not deviate from midline.     Palate: No mass and lesions.     Pharynx: Oropharynx is clear. Uvula midline. No pharyngeal swelling, oropharyngeal exudate, posterior oropharyngeal erythema, uvula swelling or postnasal drip.     Tonsils: No tonsillar exudate. 0 on the right. 0 on the left.  Eyes:     General: Lids are normal.        Right eye: No discharge.        Left eye: No discharge.     Conjunctiva/sclera: Conjunctivae normal.     Right eye: Right conjunctiva is not injected.     Left eye: Left conjunctiva is not injected.  Neck:     Trachea: Trachea and phonation normal.  Cardiovascular:     Rate and Rhythm: Regular rhythm. Tachycardia present.     Heart sounds: Normal heart sounds.   Pulmonary:     Effort: Pulmonary effort is normal. Tachypnea present. No bradypnea.     Breath sounds: Examination of the right-lower field reveals rales. Examination of the left-lower field reveals rales. Decreased breath sounds and rales  present. No wheezing or rhonchi.  Chest:     Chest wall: No tenderness.  Musculoskeletal:        General: Normal range of motion.     Cervical back: Full passive range of motion without pain, normal range of motion and neck supple. Normal range of motion.  Lymphadenopathy:     Cervical: No cervical adenopathy.  Skin:    General: Skin is warm and dry.     Findings: No erythema or rash.  Neurological:     General: No focal deficit present.     Mental Status: She is alert and oriented to person, place, and time. Mental status is at baseline.  Psychiatric:        Attention and Perception: Attention and perception normal.        Mood and Affect: Mood and affect normal.        Speech: Speech normal.        Behavior: Behavior normal. Behavior is cooperative.        Thought Content: Thought content normal.     Visual Acuity Right Eye Distance:   Left Eye Distance:   Bilateral Distance:    Right Eye Near:   Left Eye Near:    Bilateral Near:     [facility] Couse / Diagnostics / Procedures:     Radiology No results found.  Procedures Procedures (including critical care time) EKG  Pending results:  Labs Reviewed  POCT URINE DIPSTICK - Abnormal; Notable for the following components:      Result Value   Color, UA orange (*)    Bilirubin, UA small (*)    Ketones, POC UA >= (160) (*)    POC PROTEIN,UA =100 (*)    All other components within normal limits  URINE CULTURE  CBC WITH DIFFERENTIAL/PLATELET  COMPREHENSIVE METABOLIC PANEL WITH GFR  POCT URINE PREGNANCY  POC COVID19/FLU A&B COMBO  CERVICOVAGINAL ANCILLARY ONLY    Medications Ordered in UC: Medications  sodium chloride  0.9 % bolus 1,000 mL (0 mLs Intravenous Stopped 02/20/24 1943)   ondansetron  (ZOFRAN ) injection 8 mg (8 mg Intravenous Given 02/20/24 1813)  albuterol  (PROVENTIL ) (2.5 MG/3ML) 0.083% nebulizer solution 2.5 mg (2.5 mg Nebulization Given 02/20/24 1937)    UC Diagnoses / Final Clinical Impressions(s)   I have reviewed the triage vital signs and the nursing notes.  Pertinent labs & imaging results that were available during my care of the patient were reviewed by me and considered in my medical decision making (see chart for details).    Final diagnoses:  Feeling unwell  Nausea vomiting and diarrhea  Dehydration  Mild intermittent asthma with acute exacerbation  Cough, persistent  Vaginal discharge  HIV disease (HCC)   SpO2 improved to 98% post nebulized bronchodilator.  Patient reported significant improvement of weakness, nausea, fatigue after receiving 1000 cc of normal saline and Zofran  8 mg.  Patient tolerated p.o. liquids and solids during visit.  Patient provided with a prescription for Zofran  to ensure nausea does not return.  Per personal interpretation of chest x-ray, believe patient is having asthma exacerbation.  Based on physical exam findings of crackles of bilateral lung bases and patient's HIV status, will cover patient empirically for pneumonia with azithromycin  and Suprax given patient's reported allergy to penicillin  and amoxicillin.  Urinalysis is not overly concerning for acute urinary tract infection however given patient's history of UTI with E. coli HIV status, will send urine for culture.  If urine culture is positive and organism is not susceptible  to Suprax, we will adjust her by treatment as needed based on urine culture result.  CBC and CMP obtained to evaluate for elevated white blood cell count and electrolyte abnormalities.  Repeat electrolytes as needed based on those results.  For vaginal discharge, STD testing was performed at patient's request, will notify patient of results once received and treat as needed based on  those results.  Patient was encouraged to reach out to the infectious disease clinic to schedule an appointment for follow-up of her HIV, contact information provided with AVS.    Please see discharge instructions below for details of plan of care as provided to patient. ED Prescriptions     Medication Sig Dispense Auth. Provider   ondansetron  (ZOFRAN -ODT) 4 MG disintegrating tablet Take 1 tablet (4 mg total) by mouth every 8 (eight) hours as needed for nausea or vomiting. 20 tablet Joesph Shaver Scales, PA-C   cefixime (SUPRAX) 400 MG CAPS capsule Take 1 capsule (400 mg total) by mouth daily for 7 days. 7 capsule Joesph Shaver Scales, PA-C   azithromycin  (ZITHROMAX ) 250 MG tablet Take 2 tablets (500 mg total) by mouth daily for 1 day, THEN 1 tablet (250 mg total) daily for 4 days. 6 tablet Joesph Shaver Scales, PA-C   albuterol  (VENTOLIN  HFA) 108 (90 Base) MCG/ACT inhaler Inhale 2 puffs into the lungs every 6 (six) hours as needed for wheezing or shortness of breath (Cough). 18 g Joesph Shaver Scales, PA-C      PDMP not reviewed this encounter.    Discharge Instructions      Your rapid influenza and COVID-19 antigen test today was negative.  No further influenza testing is indicated.  Please consider retesting for COVID-19 in the next 2 to 3 days, particularly if you are not feeling any better.  You are welcome to return here to urgent care to repeat the test or you can take a home COVID-19 test.     If both your COVID-19 tests are negative, then you can safely assume that your illness is due to one of the many less serious illnesses circulating in our community right now.     Conservative care is recommended with rest, drinking plenty of clear fluids, eating only when hungry, taking supportive medications for your symptoms and avoiding being around other people.  Please remain at home until you are fever free for 24 hours without the use of antifever medications such as Tylenol   and ibuprofen .   Please read below to learn more about the medications, dosages and frequencies that I recommend to help alleviate your symptoms and to get you feeling better soon:   Z-Pak (azithromycin ):  Please take two (2) tablets on day one and one tablet daily thereafter until the prescription is complete.This antibiotic can cause upset stomach, this will resolve once antibiotics are complete.  You are welcome to take a probiotic, eat yogurt, take Imodium while taking this medication.  Please avoid other systemic medications such as Maalox, Pepto-Bismol or milk of magnesia as they can interfere with the body's ability to absorb the antibiotics.       Suprax (cefixime):  Please take one (1) dose daily for 7 days.This antibiotic can cause upset stomach, this will resolve once antibiotics are complete.  You are welcome to take a probiotic, eat yogurt, take Imodium while taking this medication.  Please avoid other systemic medications such as Maalox, Pepto-Bismol or milk of magnesia as they can interfere with the body's ability to absorb the antibiotics.  ProAir , Ventolin , Proventil  (albuterol ): This inhaled medication contains a short acting beta agonist bronchodilator.  This medication relaxes the smooth muscle of the airway in the lungs.  When these muscles are tight, breathing becomes more constricted.  The result of relaxation of the smooth muscle is increased air movement and improved work of breathing.  This is a short acting medication that can be used every 4-6 hours as needed for increased work of breathing, shortness of breath, wheezing and excessive coughing.  It comes in the form of a handheld inhaler or nebulizer solution.  I recommended that for the next 3 to 4 days, this medication is used 4 times daily on a scheduled basis then decrease to twice daily and as needed until symptoms have completely resolved which I anticipate will be several weeks.        Zofran  (ondansetron ): This is a  good antinausea medication that you can take every 8 hours as needed for symptoms of nausea and vomiting.  It is a dissolvable tablet that you do not need to take with water, just put it under your tongue and let it melt away.  I have sent a prescription to your pharmacy.   The results of your vaginal swab STD testing today which screens for gonorrhea, chlamydia, and trichomonas will be posted to your MyChart account once it is complete.  This typically takes 1 to 3 days.                        If any of your results are abnormal, you will receive a phone call regarding treatment.  Prescriptions, if any are needed, will be provided for you at your pharmacy.   If symptoms have worsened in the next 3 to 5 days, please go to the emergency room for further evaluation.   Please follow-up with your primary care provider in the next 3 to 5 days for repeat evaluation of your breathing and to discuss referral to infectious disease.  I have also enclosed contact information for the infectious disease clinic at [facility] if he would like to call and schedule an appointment directly.   Thank you for visiting urgent care today.  We appreciate the opportunity to participate in your care.       Disposition Upon Discharge:  Condition: stable for discharge home  Patient presented with an acute illness with associated systemic symptoms and significant discomfort requiring urgent management. In my opinion, this is a condition that a prudent lay person (someone who possesses an average knowledge of health and medicine) may potentially expect to result in complications if not addressed urgently such as respiratory distress, impairment of bodily function or dysfunction of bodily organs.   Routine symptom specific, illness specific and/or disease specific instructions were discussed with the patient and/or caregiver at length.   As such, the patient has been evaluated and assessed, work-up was performed and treatment  was provided in alignment with urgent care protocols and evidence based medicine.  Patient/parent/caregiver has been advised that the patient may require follow up for further testing and treatment if the symptoms continue in spite of treatment, as clinically indicated and appropriate.  Patient/parent/caregiver has been advised to return to the [facility] or PCP if no better; to PCP or the Emergency Department if new signs and symptoms develop, or if the current signs or symptoms continue to change or worsen for further workup, evaluation and treatment as clinically indicated and appropriate  The patient will follow  up with their current PCP if and as advised. If the patient does not currently have a PCP we will assist them in obtaining one.   The patient may need specialty follow up if the symptoms continue, in spite of conservative treatment and management, for further workup, evaluation, consultation and treatment as clinically indicated and appropriate.  Patient/parent/caregiver verbalized understanding and agreement of plan as discussed.  All questions were addressed during visit.  Please see discharge instructions below for further details of plan.  This office note has been dictated using Teaching laboratory technician.  Unfortunately, this method of dictation can sometimes lead to typographical or grammatical errors.  I apologize for your inconvenience in advance if this occurs.  Please do not hesitate to reach out to me if clarification is needed.      Joesph Shaver Scales, PA-C 02/20/24 2009

## 2024-02-20 NOTE — ED Notes (Signed)
 [name], [name] notified of recent vitals.

## 2024-02-20 NOTE — ED Triage Notes (Signed)
 Patient speaks of a uti, but unable to keep any food down, but denies taking an antibiotic.  Patient has a dry cough.  Symptoms for possibly a week.  Denies taking any medications.  Patient has not vomited today because she says she has not had anything to eat or drink.

## 2024-02-20 NOTE — Discharge Instructions (Addendum)
 Your rapid influenza and COVID-19 antigen test today was negative.  No further influenza testing is indicated.  Please consider retesting for COVID-19 in the next 2 to 3 days, particularly if you are not feeling any better.  You are welcome to return here to urgent care to repeat the test or you can take a home COVID-19 test.     If both your COVID-19 tests are negative, then you can safely assume that your illness is due to one of the many less serious illnesses circulating in our community right now.     Conservative care is recommended with rest, drinking plenty of clear fluids, eating only when hungry, taking supportive medications for your symptoms and avoiding being around other people.  Please remain at home until you are fever free for 24 hours without the use of antifever medications such as Tylenol  and ibuprofen .   Please read below to learn more about the medications, dosages and frequencies that I recommend to help alleviate your symptoms and to get you feeling better soon:   Z-Pak (azithromycin ):  Please take two (2) tablets on day one and one tablet daily thereafter until the prescription is complete.This antibiotic can cause upset stomach, this will resolve once antibiotics are complete.  You are welcome to take a probiotic, eat yogurt, take Imodium while taking this medication.  Please avoid other systemic medications such as Maalox, Pepto-Bismol or milk of magnesia as they can interfere with the body's ability to absorb the antibiotics.       Suprax (cefixime):  Please take one (1) dose daily for 7 days.This antibiotic can cause upset stomach, this will resolve once antibiotics are complete.  You are welcome to take a probiotic, eat yogurt, take Imodium while taking this medication.  Please avoid other systemic medications such as Maalox, Pepto-Bismol or milk of magnesia as they can interfere with the body's ability to absorb the antibiotics.   ProAir , Ventolin , Proventil  (albuterol ):  This inhaled medication contains a short acting beta agonist bronchodilator.  This medication relaxes the smooth muscle of the airway in the lungs.  When these muscles are tight, breathing becomes more constricted.  The result of relaxation of the smooth muscle is increased air movement and improved work of breathing.  This is a short acting medication that can be used every 4-6 hours as needed for increased work of breathing, shortness of breath, wheezing and excessive coughing.  It comes in the form of a handheld inhaler or nebulizer solution.  I recommended that for the next 3 to 4 days, this medication is used 4 times daily on a scheduled basis then decrease to twice daily and as needed until symptoms have completely resolved which I anticipate will be several weeks.        Zofran  (ondansetron ): This is a good antinausea medication that you can take every 8 hours as needed for symptoms of nausea and vomiting.  It is a dissolvable tablet that you do not need to take with water, just put it under your tongue and let it melt away.  I have sent a prescription to your pharmacy.   The results of your vaginal swab STD testing today which screens for gonorrhea, chlamydia, and trichomonas will be posted to your MyChart account once it is complete.  This typically takes 1 to 3 days.                        If any of your results are  abnormal, you will receive a phone call regarding treatment.  Prescriptions, if any are needed, will be provided for you at your pharmacy.   If symptoms have worsened in the next 3 to 5 days, please go to the emergency room for further evaluation.   Please follow-up with your primary care provider in the next 3 to 5 days for repeat evaluation of your breathing and to discuss referral to infectious disease.  I have also enclosed contact information for the infectious disease clinic at [facility] if he would like to call and schedule an appointment directly.   Thank you for visiting urgent  care today.  We appreciate the opportunity to participate in your care.

## 2024-02-21 ENCOUNTER — Ambulatory Visit: Payer: Self-pay | Admitting: Emergency Medicine

## 2024-02-21 LAB — CERVICOVAGINAL ANCILLARY ONLY
Bacterial Vaginitis (gardnerella): POSITIVE — AB
Candida Glabrata: NEGATIVE
Candida Vaginitis: POSITIVE — AB
Chlamydia: NEGATIVE
Comment: NEGATIVE
Comment: NEGATIVE
Comment: NEGATIVE
Comment: NEGATIVE
Comment: NEGATIVE
Comment: NORMAL
Neisseria Gonorrhea: NEGATIVE
Trichomonas: NEGATIVE

## 2024-02-21 LAB — URINE CULTURE

## 2024-02-21 MED ORDER — METRONIDAZOLE 0.75 % VA GEL: VAGINAL | 0 refills | Status: AC

## 2024-02-21 MED ORDER — FLUCONAZOLE 150 MG PO TABS: ORAL_TABLET | ORAL | 0 refills | Status: DC

## 2024-02-21 NOTE — Progress Notes (Signed)
 Urine culture was unremarkable, recollection not needed as patient is taking antibiotics for pneumonia prophylaxis at this time.

## 2024-02-21 NOTE — Progress Notes (Signed)
 Cyto positive for BV and candida.  RX for metronidazole  vaginal gel sent for ttmt of BV due to pt already taking to abx for PNA prophylaxis.  RX for fluconazole sent for ttmt of vaginal candida.  RX sent to pharmacy on record, [facility] in [place].

## 2024-02-21 NOTE — Progress Notes (Signed)
 Metabolic panel is essentially normal.  White blood cell count is normal, hemoglobin is a little bit low.  Patient complained of blood on toilet tissue after moving her bowels.  There is concern for GI bleed.  Patient should follow-up with PCP to discuss referral to gastroenterology for further evaluation.  If nausea, vomiting and diarrhea has not significantly improved, recommend go to the emergency room for further evaluation.

## 2024-02-23 ENCOUNTER — Other Ambulatory Visit: Payer: Self-pay

## 2024-02-23 ENCOUNTER — Emergency Department (HOSPITAL_COMMUNITY)

## 2024-02-23 ENCOUNTER — Encounter (HOSPITAL_COMMUNITY): Payer: Self-pay

## 2024-02-23 ENCOUNTER — Inpatient Hospital Stay (HOSPITAL_COMMUNITY)
Admission: EM | Admit: 2024-02-23 | Discharge: 2024-02-29 | DRG: 974 | Disposition: A | Attending: Internal Medicine | Admitting: Internal Medicine

## 2024-02-23 DIAGNOSIS — J129 Viral pneumonia, unspecified: Secondary | ICD-10-CM

## 2024-02-23 DIAGNOSIS — R0600 Dyspnea, unspecified: Secondary | ICD-10-CM

## 2024-02-23 DIAGNOSIS — B20 Human immunodeficiency virus [HIV] disease: Secondary | ICD-10-CM

## 2024-02-23 DIAGNOSIS — R52 Pain, unspecified: Principal | ICD-10-CM

## 2024-02-23 DIAGNOSIS — J189 Pneumonia, unspecified organism: Secondary | ICD-10-CM | POA: Diagnosis present

## 2024-02-23 DIAGNOSIS — R29898 Other symptoms and signs involving the musculoskeletal system: Secondary | ICD-10-CM

## 2024-02-23 DIAGNOSIS — D7389 Other diseases of spleen: Secondary | ICD-10-CM

## 2024-02-23 DIAGNOSIS — R0902 Hypoxemia: Secondary | ICD-10-CM

## 2024-02-23 DIAGNOSIS — Z91199 Patient's noncompliance with other medical treatment and regimen due to unspecified reason: Secondary | ICD-10-CM

## 2024-02-23 LAB — RESP PANEL BY RT-PCR (RSV, FLU A&B, COVID)  RVPGX2
Influenza A by PCR: NEGATIVE
Influenza B by PCR: NEGATIVE
Resp Syncytial Virus by PCR: NEGATIVE
SARS Coronavirus 2 by RT PCR: NEGATIVE

## 2024-02-23 LAB — I-STAT VENOUS BLOOD GAS, ED
Acid-Base Excess: 6 mmol/L — ABNORMAL HIGH (ref 0.0–2.0)
Bicarbonate: 30.6 mmol/L — ABNORMAL HIGH (ref 20.0–28.0)
Calcium, Ion: 1.04 mmol/L — ABNORMAL LOW (ref 1.15–1.40)
HCT: 31 % — ABNORMAL LOW (ref 36.0–46.0)
Hemoglobin: 10.5 g/dL — ABNORMAL LOW (ref 12.0–15.0)
O2 Saturation: 69 %
Potassium: 4.6 mmol/L (ref 3.5–5.1)
Sodium: 135 mmol/L (ref 135–145)
TCO2: 32 mmol/L (ref 22–32)
pCO2, Ven: 43.1 mmHg — ABNORMAL LOW (ref 44–60)
pH, Ven: 7.459 — ABNORMAL HIGH (ref 7.25–7.43)
pO2, Ven: 34 mmHg (ref 32–45)

## 2024-02-23 LAB — RESPIRATORY PANEL BY PCR

## 2024-02-23 LAB — I-STAT CHEM 8, ED
BUN: 6 mg/dL (ref 6–20)
Calcium, Ion: 1.05 mmol/L — ABNORMAL LOW (ref 1.15–1.40)
Chloride: 96 mmol/L — ABNORMAL LOW (ref 98–111)
Creatinine, Ser: 0.8 mg/dL (ref 0.44–1.00)
Glucose, Bld: 97 mg/dL (ref 70–99)
HCT: 30 % — ABNORMAL LOW (ref 36.0–46.0)
Hemoglobin: 10.2 g/dL — ABNORMAL LOW (ref 12.0–15.0)
Potassium: 4.7 mmol/L (ref 3.5–5.1)
Sodium: 135 mmol/L (ref 135–145)
TCO2: 29 mmol/L (ref 22–32)

## 2024-02-23 LAB — CBC WITH DIFFERENTIAL/PLATELET
Abs Immature Granulocytes: 0.31 K/uL — ABNORMAL HIGH (ref 0.00–0.07)
Basophils Absolute: 0 K/uL (ref 0.0–0.1)
Basophils Relative: 0 %
Eosinophils Absolute: 0 K/uL (ref 0.0–0.5)
Eosinophils Relative: 0 %
HCT: 30.8 % — ABNORMAL LOW (ref 36.0–46.0)
Hemoglobin: 9.5 g/dL — ABNORMAL LOW (ref 12.0–15.0)
Immature Granulocytes: 4 %
Lymphocytes Relative: 8 %
Lymphs Abs: 0.6 K/uL — ABNORMAL LOW (ref 0.7–4.0)
MCH: 26.1 pg (ref 26.0–34.0)
MCHC: 30.8 g/dL (ref 30.0–36.0)
MCV: 84.6 fL (ref 80.0–100.0)
Monocytes Absolute: 0.6 K/uL (ref 0.1–1.0)
Monocytes Relative: 8 %
Neutro Abs: 6.9 K/uL (ref 1.7–7.7)
Neutrophils Relative %: 80 %
Platelets: 373 K/uL (ref 150–400)
RBC: 3.64 MIL/uL — ABNORMAL LOW (ref 3.87–5.11)
RDW: 13.9 % (ref 11.5–15.5)
WBC: 8.6 K/uL (ref 4.0–10.5)
nRBC: 0 % (ref 0.0–0.2)

## 2024-02-23 LAB — COMPREHENSIVE METABOLIC PANEL WITH GFR
ALT: 8 U/L (ref 0–44)
AST: 23 U/L (ref 15–41)
Albumin: 1.8 g/dL — ABNORMAL LOW (ref 3.5–5.0)
Alkaline Phosphatase: 76 U/L (ref 38–126)
Anion gap: 12 (ref 5–15)
BUN: 5 mg/dL — ABNORMAL LOW (ref 6–20)
CO2: 28 mmol/L (ref 22–32)
Calcium: 8.4 mg/dL — ABNORMAL LOW (ref 8.9–10.3)
Chloride: 97 mmol/L — ABNORMAL LOW (ref 98–111)
Creatinine, Ser: 0.82 mg/dL (ref 0.44–1.00)
GFR, Estimated: >60 mL/min (ref >60)
Glucose, Bld: 98 mg/dL (ref 70–99)
Potassium: 3.4 mmol/L — ABNORMAL LOW (ref 3.5–5.1)
Sodium: 137 mmol/L (ref 135–145)
Total Bilirubin: 0.6 mg/dL (ref 0.0–1.2)
Total Protein: 7 g/dL (ref 6.5–8.1)

## 2024-02-23 LAB — I-STAT CG4 LACTIC ACID, ED: Lactic Acid, Venous: 1.3 mmol/L (ref 0.5–1.9)

## 2024-02-23 LAB — ETHANOL: Alcohol, Ethyl (B): <15 mg/dL (ref <15)

## 2024-02-23 MED ORDER — AZITHROMYCIN 500 MG PO TABS
500.0000 mg | ORAL_TABLET | Freq: Every day | ORAL | Status: AC
  Administered 2024-02-23 – 2024-02-25 (×3): 500 mg via ORAL
  Filled 2024-02-23: qty 2
  Filled 2024-02-23: qty 1
  Filled 2024-02-23: qty 2

## 2024-02-23 MED ORDER — METRONIDAZOLE 0.75 % VA GEL
1.0000 | Freq: Every day | VAGINAL | Status: DC
  Administered 2024-02-23 – 2024-02-27 (×5): 1 via VAGINAL
  Filled 2024-02-23 (×2): qty 70

## 2024-02-23 MED ORDER — IPRATROPIUM-ALBUTEROL 0.5-2.5 (3) MG/3ML IN SOLN
3.0000 mL | Freq: Once | RESPIRATORY_TRACT | Status: AC
  Administered 2024-02-23: 3 mL via RESPIRATORY_TRACT
  Filled 2024-02-23: qty 3

## 2024-02-23 MED ORDER — SODIUM CHLORIDE 0.9 % IV BOLUS
1000.0000 mL | Freq: Once | INTRAVENOUS | Status: AC
  Administered 2024-02-23: 1000 mL via INTRAVENOUS

## 2024-02-23 MED ORDER — VANCOMYCIN HCL 1250 MG/250ML IV SOLN
1250.0000 mg | Freq: Once | INTRAVENOUS | Status: AC
  Administered 2024-02-23: 1250 mg via INTRAVENOUS
  Filled 2024-02-23: qty 250

## 2024-02-23 MED ORDER — SODIUM CHLORIDE 0.9 % IV SOLN
1.0000 g | INTRAVENOUS | Status: AC
  Administered 2024-02-23 – 2024-02-27 (×5): 1 g via INTRAVENOUS
  Filled 2024-02-23 (×5): qty 10

## 2024-02-23 MED ORDER — MORPHINE SULFATE (PF) 2 MG/ML IV SOLN
2.0000 mg | Freq: Once | INTRAVENOUS | Status: AC
  Administered 2024-02-23: 2 mg via INTRAVENOUS
  Filled 2024-02-23: qty 1

## 2024-02-23 MED ORDER — ACETAMINOPHEN 500 MG PO TABS
1000.0000 mg | ORAL_TABLET | Freq: Once | ORAL | Status: AC
  Administered 2024-02-23: 1000 mg via ORAL
  Filled 2024-02-23: qty 2

## 2024-02-23 MED ORDER — SODIUM CHLORIDE 0.9 % IV SOLN
2.0000 g | Freq: Once | INTRAVENOUS | Status: AC
  Administered 2024-02-23: 2 g via INTRAVENOUS
  Filled 2024-02-23: qty 12.5

## 2024-02-23 MED ORDER — METHYLPREDNISOLONE NA SUC (PF) 125 MG IJ SOLR
125.0000 mg | Freq: Once | INTRAMUSCULAR | Status: DC
  Filled 2024-02-23: qty 2

## 2024-02-23 MED ORDER — IPRATROPIUM-ALBUTEROL 0.5-2.5 (3) MG/3ML IN SOLN: 3.0000 mL | Freq: Four times a day (QID) | RESPIRATORY_TRACT | Status: DC | PRN

## 2024-02-23 MED ORDER — KETOROLAC TROMETHAMINE 15 MG/ML IJ SOLN
15.0000 mg | Freq: Once | INTRAMUSCULAR | Status: AC
  Administered 2024-02-23: 15 mg via INTRAVENOUS
  Filled 2024-02-23: qty 1

## 2024-02-23 NOTE — H&P (Addendum)
 History and Physical    Patient: [name] FMW:[id number] DOB: 1985/11/06 DOA: 02/23/2024 DOS: the patient was seen and examined on 02/23/2024 PCP: Gareth Mliss FALCON, FNP  Patient coming from: Home  Chief Complaint:  Chief Complaint  Patient presents with   Generalized Body Aches   HPI: [name] is a 38 y.o. female with medical history significant of asthma, HIV, depression, and s/p recent course of abx for CAP who p/w f/c and whole body aches for the past week iso missing ART for weeks now.   The patient reported experiencing weakness for approximately one week. The patient described having severe whole body aches and was unable to move due to pain and fatigue. The patient had visited the hospital a few days prior, where it was suspected that the patient had pneumonia and was given abx (cefixime and azithromycin ), which she reported taking as prescribed. However, despite taking the medication, the patient's symptoms did not improve; thus, she came back to the ED for further evaluation.  In the ED, pt tachycardic and tachypneic w/o hypoxia. Labs unremarkable. CXR notable for mid and lower lung symmetric interstitial opacities, nonspecific for atypical pneumonia or early edema. Recent vaginal swab positive for candida and gardnerella on 12/1. EDP administered Iv cefepime/vancomycin, solumedrol ,and requested admission for ongoing w/u given immunocompromised status.   Review of Systems: As mentioned in the history of present illness. All other systems reviewed and are negative. Past Medical History:  Diagnosis Date   Asthma    Depression    Postpartum after 1st pregnancy   Hemorrhoids    HIV positive (HCC)    Past Surgical History:  Procedure Laterality Date   DILATION AND CURETTAGE OF UTERUS     LAPAROSCOPY  01/06/2011   Procedure: LAPAROSCOPY OPERATIVE;  Surgeon: Olam DELENA Mill, MD;  Location: [facility];  Service: Gynecology;  Laterality: N/A;   LAPAROTOMY   01/06/2011   Procedure: LAPAROTOMY;  Surgeon: Olam DELENA Mill, MD;  Location: [facility];  Service: Gynecology;  Laterality: N/A;   SALPINGOOPHORECTOMY     Social History:  reports that she quit smoking about 12 years ago. Her smoking use included cigarettes. She started smoking about 19 years ago. She has a 3.5 pack-year smoking history. She has never used smokeless tobacco. She reports that she does not drink alcohol and does not use drugs.  Allergies  Allergen Reactions   Benadryl  [Diphenhydramine ] Itching   Penicillins     Family History  Problem Relation Age of Onset   Diabetes Mother    Hypertension Mother    Hyperlipidemia Mother    Kidney disease Mother    Diabetes Brother    Diabetes Maternal Aunt    Hypertension Maternal Aunt    Asthma Daughter     Prior to Admission medications   Medication Sig Start Date End Date Taking? Authorizing Provider  albuterol  (VENTOLIN  HFA) 108 (90 Base) MCG/ACT inhaler Inhale 2 puffs into the lungs every 6 (six) hours as needed for wheezing or shortness of breath (Cough). 02/20/24   Joesph Shaver Scales, PA-C  azithromycin  (ZITHROMAX ) 250 MG tablet Take 2 tablets (500 mg total) by mouth daily for 1 day, THEN 1 tablet (250 mg total) daily for 4 days. 02/20/24 02/25/24  Joesph Shaver Scales, PA-C  cefixime (SUPRAX) 400 MG CAPS capsule Take 1 capsule (400 mg total) by mouth daily for 7 days. 02/20/24 02/27/24  Joesph Shaver Scales, PA-C  fluconazole (DIFLUCAN) 150 MG tablet Take 1 tablet today.  Take second  tablet 3 days later. 02/21/24   Joesph Shaver Scales, PA-C  metroNIDAZOLE  (METROGEL ) 0.75 % vaginal gel Insert vaginally using applicator nightly for 5 nights.  Abstain from sexual intercourse or tampon use until treatment is complete. 02/21/24   Joesph Shaver Scales, PA-C  Multiple Vitamin (MULTIVITAMIN WITH MINERALS) TABS tablet Take 1 tablet by mouth daily.    [provider]  ondansetron  (ZOFRAN -ODT) 4 MG disintegrating  tablet Take 1 tablet (4 mg total) by mouth every 8 (eight) hours as needed for nausea or vomiting. 02/20/24   Joesph Shaver Scales, PA-C    Physical Exam: Vitals:   02/23/24 1115 02/23/24 1130 02/23/24 1140 02/23/24 1143  BP:   110/67   Pulse: (!) 120 (!) 116 (!) 121   Resp: (!) 32 (!) 27 18   Temp:    99 F (37.2 C)  TempSrc:    Oral  SpO2: 100% 99% 99%    General: Alert, oriented x3, resting comfortably in no acute distress Respiratory: Diffuse rhonchi; no wheezing Cardiovascular: Regular rate and rhythm w/o m/r/g Abdomen: Soft, nontender, nondistended. Positive bowel sounds   Data Reviewed:  Lab Results  Component Value Date   WBC 8.6 02/23/2024   HGB 10.5 (L) 02/23/2024   HGB 10.2 (L) 02/23/2024   HCT 31.0 (L) 02/23/2024   HCT 30.0 (L) 02/23/2024   MCV 84.6 02/23/2024   PLT 373 02/23/2024   Lab Results  Component Value Date   GLUCOSE 97 02/23/2024   CALCIUM 8.4 (L) 02/23/2024   NA 135 02/23/2024   NA 135 02/23/2024   K 4.6 02/23/2024   K 4.7 02/23/2024   CO2 28 02/23/2024   CL 96 (L) 02/23/2024   BUN 6 02/23/2024   CREATININE 0.80 02/23/2024   Lab Results  Component Value Date   ALT 8 02/23/2024   AST 23 02/23/2024   ALKPHOS 76 02/23/2024   BILITOT 0.6 02/23/2024   Lab Results  Component Value Date   INR 1.17 01/06/2011   Radiology: DG Chest Port 1 View Result Date: 02/23/2024 CLINICAL DATA:  Fever, generalized weakness, tachypnea EXAM: PORTABLE CHEST 1 VIEW COMPARISON:  02/02/2016, 02/20/2024 FINDINGS: Mild bilateral mid and lower lung interstitial opacities, nonspecific for atypical or viral pneumonia versus early edema. Prominent heart size. No effusion or pneumothorax. Trachea midline. No osseous abnormality. IMPRESSION: Mid and lower lung symmetric interstitial opacities, nonspecific for atypical pneumonia or early edema. Electronically Signed   By: CHRISTELLA.  Shick M.D.   On: 02/23/2024 11:01    Assessment and Plan: 20F h/o asthma, HIV, depression,  and s/p recent course of abx for CAP who p/w f/c and whole body aches for the past week iso missing ART for weeks now.   Multifocal CAP Fever/Chills Whole body aches -IV CTX 1g daily to complete 5 day CAP course -PO azithromycin  500mg  daily to complete 3 day CAP course -Duonebs prn -Wean O2 as tolerated -Ambulatory pulse ox prior to d/c -F/u RVP -F/u blood culture x2  BV -PTA vaginal metronidazole  0.75% gel nightly  Vulvovaginal candidiasis -Fluconazole 150mg  on 12/4 if needed  HIV -ID consulted; apprec eval/recs -F/u HIV VL/CD4  Asthma -Duoneb prn   Advance Care Planning:   Code Status: Prior   Consults: ID  Family Communication: Alyse Jerel Nevin  Severity of Illness: The appropriate patient status for this patient is INPATIENT. Inpatient status is judged to be reasonable and necessary in order to provide the required intensity of service to ensure the patient's safety. The patient's presenting symptoms, physical  exam findings, and initial radiographic and laboratory data in the context of their chronic comorbidities is felt to place them at high risk for further clinical deterioration. Furthermore, it is not anticipated that the patient will be medically stable for discharge from the hospital within 2 midnights of admission.   * I certify that at the point of admission it is my clinical judgment that the patient will require inpatient hospital care spanning beyond 2 midnights from the point of admission due to high intensity of service, high risk for further deterioration and high frequency of surveillance required.*   ------- I spent 55 minutes reviewing previous notes, at the bedside counseling/discussing the treatment plan, and performing clinical documentation.  Author: Marsha Ada, MD 02/23/2024 12:23 PM  For on call review [email or web address].

## 2024-02-23 NOTE — ED Provider Notes (Signed)
 [place] EMERGENCY DEPARTMENT AT [facility] Provider Note   CSN: [id number] Arrival date & time: 02/23/24  1002     Patient presents with: Generalized Body Aches   [name] is a 38 y.o. female.   38 year old female with prior medical history as detailed below presents for evaluation.  Patient presents with EMS.  Patient was noted to be febrile and hypoxic on room air to 86%.  Patient tachypneic and tachycardic.  Patient with history of HIV.  She apparently is not taking any antiretrovirals currently.  Patient reports that she has been feeling ill for 1 week.  She complains of cough, fever, diffuse achy pain all over.  She was seen on the 30th at urgent care for same complaints.  She was given a course of Suprax  and azithromycin .  It is unclear whether she took these antibiotics.  The history is provided by the patient and medical records.       Prior to Admission medications   Medication Sig Start Date End Date Taking? Authorizing Provider  albuterol  (VENTOLIN  HFA) 108 (90 Base) MCG/ACT inhaler Inhale 2 puffs into the lungs every 6 (six) hours as needed for wheezing or shortness of breath (Cough). 02/20/24   Joesph Shaver Scales, PA-C  azithromycin  (ZITHROMAX ) 250 MG tablet Take 2 tablets (500 mg total) by mouth daily for 1 day, THEN 1 tablet (250 mg total) daily for 4 days. 02/20/24 02/25/24  Joesph Shaver Scales, PA-C  cefixime  (SUPRAX ) 400 MG CAPS capsule Take 1 capsule (400 mg total) by mouth daily for 7 days. 02/20/24 02/27/24  Joesph Shaver Scales, PA-C  fluconazole  (DIFLUCAN ) 150 MG tablet Take 1 tablet today.  Take second tablet 3 days later. 02/21/24   Joesph Shaver Scales, PA-C  metroNIDAZOLE  (METROGEL ) 0.75 % vaginal gel Insert vaginally using applicator nightly for 5 nights.  Abstain from sexual intercourse or tampon use until treatment is complete. 02/21/24   Joesph Shaver Scales, PA-C  Multiple Vitamin (MULTIVITAMIN WITH MINERALS) TABS tablet  Take 1 tablet by mouth daily.    [provider]  ondansetron  (ZOFRAN -ODT) 4 MG disintegrating tablet Take 1 tablet (4 mg total) by mouth every 8 (eight) hours as needed for nausea or vomiting. 02/20/24   Joesph Shaver Scales, PA-C    Allergies: Benadryl  [diphenhydramine ] and Penicillins    Review of Systems  All other systems reviewed and are negative.   Updated Vital Signs LMP 01/24/2024   SpO2 (!) 86%   Physical Exam Vitals and nursing note reviewed.  Constitutional:      General: She is not in acute distress.    Appearance: She is well-developed.     Comments: Appears uncomfortable  HENT:     Head: Normocephalic and atraumatic.  Eyes:     Conjunctiva/sclera: Conjunctivae normal.     Pupils: Pupils are equal, round, and reactive to light.  Neck:     Comments: Decreased breath sounds at bilateral bases Cardiovascular:     Rate and Rhythm: Regular rhythm. Tachycardia present.  Pulmonary:     Effort: Pulmonary effort is normal. No respiratory distress.     Breath sounds: Normal breath sounds.  Abdominal:     General: There is no distension.     Palpations: Abdomen is soft.     Tenderness: There is no abdominal tenderness.  Musculoskeletal:        General: No deformity. Normal range of motion.     Cervical back: Normal range of motion and neck supple.  Skin:  General: Skin is warm and dry.  Neurological:     General: No focal deficit present.     Mental Status: She is alert and oriented to person, place, and time.     (all labs ordered are listed, but only abnormal results are displayed) Labs Reviewed  CULTURE, BLOOD (ROUTINE X 2)  CULTURE, BLOOD (ROUTINE X 2)  RESP PANEL BY RT-PCR (RSV, FLU A&B, COVID)  RVPGX2  CBC WITH DIFFERENTIAL/PLATELET  ETHANOL  COMPREHENSIVE METABOLIC PANEL WITH GFR  RAPID URINE DRUG SCREEN, HOSP PERFORMED  URINALYSIS, W/ REFLEX TO CULTURE (INFECTION SUSPECTED)  I-STAT VENOUS BLOOD GAS, ED  I-STAT CHEM 8, ED  I-STAT CG4  LACTIC ACID, ED    EKG: None  Radiology: No results found.   Procedures   Medications Ordered in the ED  ipratropium-albuterol  (DUONEB) 0.5-2.5 (3) MG/3ML nebulizer solution 3 mL (has no administration in time range)  ketorolac  (TORADOL ) 15 MG/ML injection 15 mg (has no administration in time range)  acetaminophen  (TYLENOL ) tablet 1,000 mg (has no administration in time range)  sodium chloride  0.9 % bolus 1,000 mL (has no administration in time range)  methylPREDNISolone sodium succinate (SOLU-MEDROL) 125 MG injection 125 mg (has no administration in time range)                                    Medical Decision Making Patient presents with 1 week of persistent myalgia, cough, shortness of breath.  Patient is noted to be tachycardic and febrile and hypoxic on initial evaluation.  Patient was seen on the 30th for similar complaint.  She is reporting compliance with prescribed Suprax and azithromycin .  Despite these medications her clinical symptoms have worsened.  Presentation is concerning for possible pneumonia.  She has a history of HIV.  She is not on any antiretrovirals at this time.  Broad-spectrum antibiotics initiated.  IV fluids initiated.  Patient would benefit from admission.  Medicine team made aware of case and need for admission.  Amount and/or Complexity of Data Reviewed Labs: ordered. Radiology: ordered.  Risk OTC drugs. Prescription drug management.   CRITICAL CARE Performed by: Maude JAYSON Galloway   Total critical care time: 30 minutes  Critical care time was exclusive of separately billable procedures and treating other patients.  Critical care was necessary to treat or prevent imminent or life-threatening deterioration.  Critical care was time spent personally by me on the following activities: development of treatment plan with patient and/or surrogate as well as nursing, discussions with consultants, evaluation of patient's response to treatment,  examination of patient, obtaining history from patient or surrogate, ordering and performing treatments and interventions, ordering and review of laboratory studies, ordering and review of radiographic studies, pulse oximetry and re-evaluation of patient's condition.      Final diagnoses:  Body aches  Dyspnea, unspecified type  Hypoxia    ED Discharge Orders     None          Galloway Maude JAYSON, MD 02/23/24 1225

## 2024-02-23 NOTE — ED Triage Notes (Signed)
 Patient BIB EMS from home c/o generalized weakness. Patient was just at [facility]. Patient states she is unable to keep anything down at this time. Patient was tachypneic upon EMS arrival. Patient was also satting 86% on RA.

## 2024-02-23 NOTE — ED Notes (Signed)
 Second cultures unable to be obtained at this time.

## 2024-02-23 NOTE — Progress Notes (Signed)
 ED Pharmacy Antibiotic Sign Off An antibiotic consult was received from an ED provider for cefepime and vancomycin per pharmacy dosing for sepsis suspected PNA. A chart review was completed to assess appropriateness. Noted hx of ESBL (urinary source, suspecting PNA this ED visit). Also noted PCN allergy, patient has tolerated cephalosporins in the past.   The following one time order(s) were placed:  Cefepime 2g  Vancomycin 1250mg  (using historical weight)   Further antibiotic and/or antibiotic pharmacy consults should be ordered by the admitting provider if indicated.   Thank you for allowing pharmacy to be a part of this patient's care.   Powell Blush, PharmD, BCCCP  Clinical Pharmacist 02/23/24 10:46 AM

## 2024-02-24 ENCOUNTER — Telehealth (HOSPITAL_COMMUNITY): Payer: Self-pay

## 2024-02-24 ENCOUNTER — Other Ambulatory Visit (HOSPITAL_COMMUNITY): Payer: Self-pay

## 2024-02-24 DIAGNOSIS — R0902 Hypoxemia: Secondary | ICD-10-CM | POA: Diagnosis not present

## 2024-02-24 DIAGNOSIS — R339 Retention of urine, unspecified: Secondary | ICD-10-CM | POA: Diagnosis not present

## 2024-02-24 DIAGNOSIS — Z21 Asymptomatic human immunodeficiency virus [HIV] infection status: Secondary | ICD-10-CM | POA: Diagnosis not present

## 2024-02-24 DIAGNOSIS — J129 Viral pneumonia, unspecified: Secondary | ICD-10-CM | POA: Diagnosis present

## 2024-02-24 DIAGNOSIS — J9601 Acute respiratory failure with hypoxia: Secondary | ICD-10-CM | POA: Diagnosis not present

## 2024-02-24 DIAGNOSIS — A419 Sepsis, unspecified organism: Secondary | ICD-10-CM | POA: Diagnosis not present

## 2024-02-24 DIAGNOSIS — J189 Pneumonia, unspecified organism: Secondary | ICD-10-CM | POA: Diagnosis present

## 2024-02-24 DIAGNOSIS — B348 Other viral infections of unspecified site: Secondary | ICD-10-CM | POA: Diagnosis not present

## 2024-02-24 DIAGNOSIS — D7389 Other diseases of spleen: Secondary | ICD-10-CM | POA: Diagnosis not present

## 2024-02-24 DIAGNOSIS — Z91119 Patient's noncompliance with dietary regimen due to unspecified reason: Secondary | ICD-10-CM | POA: Diagnosis not present

## 2024-02-24 DIAGNOSIS — B341 Enterovirus infection, unspecified: Secondary | ICD-10-CM | POA: Diagnosis not present

## 2024-02-24 DIAGNOSIS — B3731 Acute candidiasis of vulva and vagina: Secondary | ICD-10-CM | POA: Diagnosis not present

## 2024-02-24 DIAGNOSIS — B20 Human immunodeficiency virus [HIV] disease: Secondary | ICD-10-CM | POA: Diagnosis not present

## 2024-02-24 LAB — T-HELPER CELLS (CD4) COUNT (NOT AT ARMC)
CD4 % Helper T Cell: 3 % — ABNORMAL LOW (ref 33–65)
CD4 T Cell Abs: <35 /uL — ABNORMAL LOW (ref 400–1790)

## 2024-02-24 LAB — URINALYSIS, W/ REFLEX TO CULTURE (INFECTION SUSPECTED)
Bilirubin Urine: NEGATIVE
Glucose, UA: NEGATIVE mg/dL
Hgb urine dipstick: NEGATIVE
Ketones, ur: 15 mg/dL — AB
Leukocytes,Ua: NEGATIVE
Nitrite: NEGATIVE
Protein, ur: 100 mg/dL — AB
Specific Gravity, Urine: >1.03 — ABNORMAL HIGH (ref 1.005–1.030)
pH: 6 (ref 5.0–8.0)

## 2024-02-24 LAB — RAPID URINE DRUG SCREEN, HOSP PERFORMED
Amphetamines: NOT DETECTED
Barbiturates: NOT DETECTED
Benzodiazepines: NOT DETECTED
Cocaine: NOT DETECTED
Opiates: POSITIVE — AB
Tetrahydrocannabinol: NOT DETECTED

## 2024-02-24 LAB — CK: Total CK: 44 U/L (ref 38–234)

## 2024-02-24 MED ORDER — METHYLPREDNISOLONE SODIUM SUCC 40 MG IJ SOLR
40.0000 mg | Freq: Two times a day (BID) | INTRAMUSCULAR | Status: DC
  Administered 2024-02-24 – 2024-02-25 (×3): 40 mg via INTRAVENOUS
  Filled 2024-02-24 (×3): qty 1

## 2024-02-24 MED ORDER — CHLORHEXIDINE GLUCONATE CLOTH 2 % EX PADS
6.0000 | MEDICATED_PAD | Freq: Every day | CUTANEOUS | Status: DC
  Administered 2024-02-25 – 2024-02-27 (×3): 6 via TOPICAL

## 2024-02-24 MED ORDER — ACETAMINOPHEN 325 MG PO TABS
650.0000 mg | ORAL_TABLET | Freq: Four times a day (QID) | ORAL | Status: DC | PRN
  Administered 2024-02-29: 650 mg via ORAL
  Filled 2024-02-24 (×3): qty 2

## 2024-02-24 MED ORDER — HYDROCORTISONE (PERIANAL) 2.5 % EX CREA
1.0000 | TOPICAL_CREAM | Freq: Two times a day (BID) | CUTANEOUS | Status: DC
  Administered 2024-02-24 – 2024-02-27 (×7): 1 via RECTAL
  Filled 2024-02-24 (×2): qty 28.35

## 2024-02-24 MED ORDER — ONDANSETRON HCL 4 MG/2ML IJ SOLN
4.0000 mg | Freq: Three times a day (TID) | INTRAMUSCULAR | Status: DC | PRN
  Administered 2024-02-24 – 2024-02-28 (×2): 4 mg via INTRAVENOUS
  Filled 2024-02-24 (×2): qty 2

## 2024-02-24 MED ORDER — OXYCODONE HCL 5 MG PO TABS
5.0000 mg | ORAL_TABLET | Freq: Four times a day (QID) | ORAL | Status: DC | PRN
  Administered 2024-02-24 – 2024-02-28 (×4): 5 mg via ORAL
  Filled 2024-02-24 (×6): qty 1

## 2024-02-24 MED ORDER — ENOXAPARIN SODIUM 40 MG/0.4ML IJ SOSY
40.0000 mg | PREFILLED_SYRINGE | INTRAMUSCULAR | Status: DC
  Administered 2024-02-24 – 2024-02-25 (×2): 40 mg via SUBCUTANEOUS
  Filled 2024-02-24 (×5): qty 0.4

## 2024-02-24 MED ORDER — IBUPROFEN 200 MG PO TABS
200.0000 mg | ORAL_TABLET | Freq: Once | ORAL | Status: AC
  Administered 2024-02-24: 200 mg via ORAL
  Filled 2024-02-24: qty 1

## 2024-02-24 MED ORDER — FLUCONAZOLE 150 MG PO TABS
150.0000 mg | ORAL_TABLET | Freq: Once | ORAL | Status: AC
  Administered 2024-02-24: 150 mg via ORAL
  Filled 2024-02-24: qty 1

## 2024-02-24 NOTE — ED Notes (Signed)
 Pt was unsuccessful with giving urine sample. Pt back in bed and resting.

## 2024-02-24 NOTE — Plan of Care (Signed)

## 2024-02-24 NOTE — Telephone Encounter (Signed)
 Pharmacy Patient Advocate Encounter  Insurance verification completed.    The patient is insured through [facility] MEDICAID.     Ran test claim for Biktarvy 30-120-15mg  and the current 30 day co-pay is $0.   This test claim was processed through [employer]- copay amounts may vary at other pharmacies due to [employer], or as the patient moves through the different stages of their insurance plan.

## 2024-02-24 NOTE — Progress Notes (Signed)
 PROGRESS NOTE  [name] FMW:[id number] DOB: 13-Aug-1985   PCP: Gareth Mliss FALCON, FNP  Patient is from: Home.  Lives with children.  Independently ambulates at baseline.  DOA: 02/23/2024 LOS: 0  Chief complaints Chief Complaint  Patient presents with   Generalized Body Aches     Brief Narrative / Interim history: 38 year old F with PMH of HIV, asthma, depression and recent diagnosis of pneumonia for which she has been started on antibiotics presenting with fever, chills, fatigue and myalgia, and admitted with working diagnosis of multifocal pneumonia.  CXR showed mid to lower lung interstitial opacities bilaterally.  Patient has been on cefixime since 11/30.  Has not taken any HIV medication in over a week.  RVP positive for rhinovirus.  ID consulted.  Subjective: Seen and examined earlier this morning.  No major events overnight or this morning.  Continues to endorse sore throat, shortness of breath, productive cough with clear phlegm, myalgia and fatigue.  Also reports difficulty with urination and bowel movement.  Last bowel movement yesterday.   Assessment and plan: Sepsis due to multifocal CAP and rhinovirus infection: Presents with fever, chills, myalgia and malaise.  CXR concerning for multifocal pneumonia.  Patient with HIV.  RVP positive for rhinovirus.  Blood cultures NGTD.  Has not been taking ART -Continue ceftriaxone  and Zithromax . -Continue IV Solu-Medrol. -Continue Duonebs prn -Follow quantitative HIV RNA and CD4 counts. -Follow recommendation by ID  Acute respiratory failure with hypoxia: Desaturated to 86% on RA on arrival.  Currently saturating at 95% on room air. - Management as above - Encourage incentive  Acute asthma exacerbation: - Management as above.  Flulike illness: Likely due to pneumonia and rhinovirus infection. -Care as above. PT-D/OT eval.   Bacterial vaginosis: - Continue vaginal metronidazole  0.75% gel nightly   Vulvovaginal  candidiasis -Fluconazole 150mg  once.   HIV: Reportedly has not taken ART in about a week.  No recent CD4 counts or HIV RNA. -Consulted ID this morning. -F/u HIV VL/CD4  Generalized weakness/fatigue -PT/OT eval    Body mass index is 29.45 kg/m.          DVT prophylaxis:  enoxaparin (LOVENOX) injection 40 mg Start: 02/24/24 1045  Code Status: Full code Family Communication: None at bedside Level of care: Telemetry Status is: Observation The patient will require care spanning > 2 midnights and should be moved to inpatient because: Sepsis due to pneumonia and rhinovirus infection in patient with HIV   Final disposition: Home   55 minutes with more than 50% spent in reviewing records, counseling patient/family and coordinating care.  Consultants:  Infectious disease  Procedures: None  Microbiology summarized: COVID-19, influenza and RSV PCR nonreactive RVP positive for rhinovirus Blood cultures NGTD  Objective: Vitals:   02/24/24 0333 02/24/24 0402 02/24/24 0843 02/24/24 0901  BP:  103/82  109/75  Pulse:  94  95  Resp:  (!) 34  (!) 29  Temp:  98.6 F (37 C)  97.9 F (36.6 C)  TempSrc:    Oral  SpO2:  100% 95% 95%  Weight: 73 kg     Height: 5' 2 (1.575 m)       Examination:  GENERAL: No apparent distress.  Nontoxic. HEENT: MMM.  Vision and hearing grossly intact.  NECK: Supple.  No apparent JVD.  RESP:  No IWOB.  Fair aeration bilaterally. CVS:  RRR. Heart sounds normal.  ABD/GI/GU: BS+. Abd soft, NTND.  MSK/EXT:  Moves extremities.  Significant BLE weakness. SKIN: no apparent skin lesion or  wound NEURO: AA.  Oriented appropriately.  No apparent focal neuro deficit other than significant BLE weakness. PSYCH: Calm. Normal affect.   Sch Meds:  Scheduled Meds:  azithromycin   500 mg Oral Daily   enoxaparin  (LOVENOX ) injection  40 mg Subcutaneous Q24H   fluconazole   150 mg Oral Once   methylPREDNISolone  (SOLU-MEDROL ) injection  40 mg Intravenous Q12H    metroNIDAZOLE   1 Applicatorful Vaginal QHS   Continuous Infusions:  cefTRIAXone  (ROCEPHIN )  IV Stopped (02/23/24 2306)   PRN Meds:.ipratropium-albuterol   Antimicrobials: Anti-infectives (From admission, onward)    Start     Dose/Rate Route Frequency Ordered Stop   02/24/24 1045  fluconazole  (DIFLUCAN ) tablet 150 mg        150 mg Oral  Once 02/24/24 1030     02/23/24 2200  cefTRIAXone  (ROCEPHIN ) 1 g in sodium chloride  0.9 % 100 mL IVPB        1 g 200 mL/hr over 30 Minutes Intravenous Every 24 hours 02/23/24 1237 02/28/24 2159   02/23/24 1245  azithromycin  (ZITHROMAX ) tablet 500 mg        500 mg Oral Daily 02/23/24 1237 02/26/24 0959   02/23/24 1100  ceFEPIme  (MAXIPIME ) 2 g in sodium chloride  0.9 % 100 mL IVPB        2 g 200 mL/hr over 30 Minutes Intravenous  Once 02/23/24 1045 02/23/24 1214   02/23/24 1100  vancomycin  (VANCOREADY) IVPB 1250 mg/250 mL        1,250 mg 166.7 mL/hr over 90 Minutes Intravenous  Once 02/23/24 1045 02/23/24 1413        I have personally reviewed the following labs and images: CBC: Recent Labs  Lab 02/20/24 1918 02/23/24 1035 02/23/24 1055  WBC 6.3 8.6  --   NEUTROABS 5.0 6.9  --   HGB 10.3* 9.5* 10.2*  10.5*  HCT 33.1* 30.8* 30.0*  31.0*  MCV 85.5 84.6  --   PLT 422* 373  --    BMP &GFR Recent Labs  Lab 02/20/24 1918 02/23/24 1035 02/23/24 1055  NA 137 137 135  135  K 3.5 3.4* 4.7  4.6  CL 102 97* 96*  CO2 23 28  --   GLUCOSE 114* 98 97  BUN 8 5* 6  CREATININE 0.68 0.82 0.80  CALCIUM 8.2* 8.4*  --    Estimated Creatinine Clearance: 90.1 mL/min (by C-G formula based on SCr of 0.8 mg/dL). Liver & Pancreas: Recent Labs  Lab 02/20/24 1918 02/23/24 1035  AST 23 23  ALT 7 8  ALKPHOS 91 76  BILITOT 0.4 0.6  PROT 7.7 7.0  ALBUMIN 2.1* 1.8*   No results for input(s): LIPASE, AMYLASE in the last 168 hours. No results for input(s): AMMONIA in the last 168 hours. Diabetic: No results for input(s): HGBA1C in the  last 72 hours. No results for input(s): GLUCAP in the last 168 hours. Cardiac Enzymes: Recent Labs  Lab 02/24/24 0453  CKTOTAL 44   No results for input(s): PROBNP in the last 8760 hours. Coagulation Profile: No results for input(s): INR, PROTIME in the last 168 hours. Thyroid Function Tests: No results for input(s): TSH, T4TOTAL, FREET4, T3FREE, THYROIDAB in the last 72 hours. Lipid Profile: No results for input(s): CHOL, HDL, LDLCALC, TRIG, CHOLHDL, LDLDIRECT in the last 72 hours. Anemia Panel: No results for input(s): VITAMINB12, FOLATE, FERRITIN, TIBC, IRON, RETICCTPCT in the last 72 hours. Urine analysis:    Component Value Date/Time   COLORURINE AMBER (A) 06/17/2023 1921   APPEARANCEUR CLOUDY (  A) 06/17/2023 1921   LABSPEC 1.017 06/17/2023 1921   PHURINE 5.0 06/17/2023 1921   GLUCOSEU NEGATIVE 06/17/2023 1921   HGBUR NEGATIVE 06/17/2023 1921   BILIRUBINUR small (A) 02/20/2024 1747   KETONESUR >= (160) (A) 02/20/2024 1747   KETONESUR 5 (A) 06/17/2023 1921   PROTEINUR 100 (A) 06/17/2023 1921   UROBILINOGEN 0.2 02/20/2024 1747   UROBILINOGEN 1.0 01/22/2020 1359   NITRITE Negative 02/20/2024 1747   NITRITE NEGATIVE 06/17/2023 1921   LEUKOCYTESUR Negative 02/20/2024 1747   LEUKOCYTESUR MODERATE (A) 06/17/2023 1921   Sepsis Labs: Invalid input(s): PROCALCITONIN, LACTICIDVEN  Microbiology: Recent Results (from the past 240 hours)  Urine Culture     Status: Abnormal   Collection Time: 02/20/24  5:57 PM   Specimen: Urine, Clean Catch  Result Value Ref Range Status   Specimen Description URINE, CLEAN CATCH  Final   Special Requests   Final    NONE Performed at [facility], [address], [place], [place] [address]    Culture MULTIPLE SPECIES PRESENT, SUGGEST RECOLLECTION (A)  Final   Report Status 02/21/2024 FINAL  Final  Culture, blood (routine x 2)     Status: None (Preliminary result)   Collection Time: 02/23/24  10:16 AM   Specimen: BLOOD  Result Value Ref Range Status   Specimen Description BLOOD RIGHT ANTECUBITAL  Final   Special Requests   Final    BOTTLES DRAWN AEROBIC AND ANAEROBIC Blood Culture adequate volume   Culture   Final    NO GROWTH < 24 HOURS Performed at [facility], [address] [place], [place] [address]    Report Status PENDING  Incomplete  Resp panel by RT-PCR (RSV, Flu A&B, Covid) Anterior Nasal Swab     Status: None   Collection Time: 02/23/24 10:17 AM   Specimen: Anterior Nasal Swab  Result Value Ref Range Status   SARS Coronavirus 2 by RT PCR NEGATIVE NEGATIVE Final   Influenza A by PCR NEGATIVE NEGATIVE Final   Influenza B by PCR NEGATIVE NEGATIVE Final    Comment: (NOTE) The Xpert Xpress SARS-CoV-2/FLU/RSV plus assay is intended as an aid in the diagnosis of influenza from Nasopharyngeal swab specimens and should not be used as a sole basis for treatment. Nasal washings and aspirates are unacceptable for Xpert Xpress SARS-CoV-2/FLU/RSV testing.  Fact Sheet for Patients: bloggercourse.com  Fact Sheet for Healthcare Providers: seriousbroker.it  This test is not yet approved or cleared by the [place]  FDA and has been authorized for detection and/or diagnosis of SARS-CoV-2 by FDA under an Emergency Use Authorization (EUA). This EUA will remain in effect (meaning this test can be used) for the duration of the COVID-19 declaration under Section 564(b)(1) of the Act, 21 U.S.C. section 360bbb-3(b)(1), unless the authorization is terminated or revoked.     Resp Syncytial Virus by PCR NEGATIVE NEGATIVE Final    Comment: (NOTE) Fact Sheet for Patients: bloggercourse.com  Fact Sheet for Healthcare Providers: seriousbroker.it  This test is not yet approved or cleared by the [place]  FDA and has been authorized for detection and/or diagnosis of  SARS-CoV-2 by FDA under an Emergency Use Authorization (EUA). This EUA will remain in effect (meaning this test can be used) for the duration of the COVID-19 declaration under Section 564(b)(1) of the Act, 21 U.S.C. section 360bbb-3(b)(1), unless the authorization is terminated or revoked.  Performed at [facility], [address] [place], [place] [address]   Respiratory (~20 pathogens) panel by  PCR     Status: Abnormal   Collection Time: 02/23/24  9:31 PM   Specimen: Nasopharyngeal Swab; Respiratory  Result Value Ref Range Status   Adenovirus NOT DETECTED NOT DETECTED Final   Coronavirus 229E NOT DETECTED NOT DETECTED Final    Comment: (NOTE) The Coronavirus on the Respiratory Panel, DOES NOT test for the novel  Coronavirus (2019 nCoV)    Coronavirus HKU1 NOT DETECTED NOT DETECTED Final   Coronavirus NL63 NOT DETECTED NOT DETECTED Final   Coronavirus OC43 NOT DETECTED NOT DETECTED Final   Metapneumovirus NOT DETECTED NOT DETECTED Final   Rhinovirus / Enterovirus DETECTED (A) NOT DETECTED Final   Influenza A NOT DETECTED NOT DETECTED Final   Influenza B NOT DETECTED NOT DETECTED Final   Parainfluenza Virus 1 NOT DETECTED NOT DETECTED Final   Parainfluenza Virus 2 NOT DETECTED NOT DETECTED Final   Parainfluenza Virus 3 NOT DETECTED NOT DETECTED Final   Parainfluenza Virus 4 NOT DETECTED NOT DETECTED Final   Respiratory Syncytial Virus NOT DETECTED NOT DETECTED Final   Bordetella pertussis NOT DETECTED NOT DETECTED Final   Bordetella Parapertussis NOT DETECTED NOT DETECTED Final   Chlamydophila pneumoniae NOT DETECTED NOT DETECTED Final   Mycoplasma pneumoniae NOT DETECTED NOT DETECTED Final    Comment: Performed at [facility], [address] [place], [place] [address]  Culture, blood (routine x 2)     Status: None (Preliminary result)   Collection Time: 02/23/24 10:09 PM   Specimen: BLOOD  Result Value Ref Range Status   Specimen Description BLOOD RIGHT  ANTECUBITAL  Final   Special Requests   Final    BOTTLES DRAWN AEROBIC AND ANAEROBIC Blood Culture adequate volume   Culture   Final    NO GROWTH < 12 HOURS Performed at [facility], [address], [place], [place] [address]    Report Status PENDING  Incomplete    Radiology Studies: DG Chest Port 1 View Result Date: 02/23/2024 CLINICAL DATA:  Fever, generalized weakness, tachypnea EXAM: PORTABLE CHEST 1 VIEW COMPARISON:  02/02/2016, 02/20/2024 FINDINGS: Mild bilateral mid and lower lung interstitial opacities, nonspecific for atypical or viral pneumonia versus early edema. Prominent heart size. No effusion or pneumothorax. Trachea midline. No osseous abnormality. IMPRESSION: Mid and lower lung symmetric interstitial opacities, nonspecific for atypical pneumonia or early edema. Electronically Signed   By: CHRISTELLA.  Shick M.D.   On: 02/23/2024 11:01      Anthoni Geerts T. Taegan Haider [facility]  If 7PM-7AM, please contact night-coverage [email or web address] 02/24/2024, 10:31 AM

## 2024-02-25 ENCOUNTER — Inpatient Hospital Stay (HOSPITAL_COMMUNITY)

## 2024-02-25 DIAGNOSIS — N760 Acute vaginitis: Secondary | ICD-10-CM

## 2024-02-25 DIAGNOSIS — R0600 Dyspnea, unspecified: Secondary | ICD-10-CM

## 2024-02-25 DIAGNOSIS — B20 Human immunodeficiency virus [HIV] disease: Secondary | ICD-10-CM

## 2024-02-25 DIAGNOSIS — R531 Weakness: Secondary | ICD-10-CM

## 2024-02-25 DIAGNOSIS — J189 Pneumonia, unspecified organism: Secondary | ICD-10-CM | POA: Diagnosis not present

## 2024-02-25 DIAGNOSIS — R339 Retention of urine, unspecified: Secondary | ICD-10-CM

## 2024-02-25 DIAGNOSIS — B3731 Acute candidiasis of vulva and vagina: Secondary | ICD-10-CM

## 2024-02-25 DIAGNOSIS — R0902 Hypoxemia: Secondary | ICD-10-CM

## 2024-02-25 LAB — CBC
HCT: 29.1 % — ABNORMAL LOW (ref 36.0–46.0)
Hemoglobin: 9.1 g/dL — ABNORMAL LOW (ref 12.0–15.0)
MCH: 26.1 pg (ref 26.0–34.0)
MCHC: 31.3 g/dL (ref 30.0–36.0)
MCV: 83.4 fL (ref 80.0–100.0)
Platelets: 416 K/uL — ABNORMAL HIGH (ref 150–400)
RBC: 3.49 MIL/uL — ABNORMAL LOW (ref 3.87–5.11)
RDW: 14 % (ref 11.5–15.5)
WBC: 3.7 K/uL — ABNORMAL LOW (ref 4.0–10.5)
nRBC: 0 % (ref 0.0–0.2)

## 2024-02-25 LAB — COMPREHENSIVE METABOLIC PANEL WITH GFR
ALT: 8 U/L (ref 0–44)
AST: 17 U/L (ref 15–41)
Albumin: 1.6 g/dL — ABNORMAL LOW (ref 3.5–5.0)
Alkaline Phosphatase: 72 U/L (ref 38–126)
Anion gap: 10 (ref 5–15)
BUN: 8 mg/dL (ref 6–20)
CO2: 27 mmol/L (ref 22–32)
Calcium: 8.3 mg/dL — ABNORMAL LOW (ref 8.9–10.3)
Chloride: 101 mmol/L (ref 98–111)
Creatinine, Ser: 0.47 mg/dL (ref 0.44–1.00)
GFR, Estimated: >60 mL/min (ref >60)
Glucose, Bld: 136 mg/dL — ABNORMAL HIGH (ref 70–99)
Potassium: 4.1 mmol/L (ref 3.5–5.1)
Sodium: 138 mmol/L (ref 135–145)
Total Bilirubin: <0.2 mg/dL (ref 0.0–1.2)
Total Protein: 6.7 g/dL (ref 6.5–8.1)

## 2024-02-25 LAB — STREP PNEUMONIAE URINARY ANTIGEN: Strep Pneumo Urinary Antigen: NEGATIVE

## 2024-02-25 LAB — HIV-1 RNA QUANT-NO REFLEX-BLD
HIV 1 RNA Quant: 17000 {copies}/mL
LOG10 HIV-1 RNA: 4.23 {Log_copies}/mL

## 2024-02-25 LAB — MAGNESIUM: Magnesium: 2 mg/dL (ref 1.7–2.4)

## 2024-02-25 LAB — CK: Total CK: 31 U/L — ABNORMAL LOW (ref 38–234)

## 2024-02-25 MED ORDER — PREDNISONE 20 MG PO TABS
40.0000 mg | ORAL_TABLET | Freq: Every day | ORAL | Status: AC
  Administered 2024-02-26 – 2024-02-27 (×2): 40 mg via ORAL
  Filled 2024-02-25 (×2): qty 2

## 2024-02-25 MED ORDER — PHENAZOPYRIDINE HCL 100 MG PO TABS
100.0000 mg | ORAL_TABLET | Freq: Once | ORAL | Status: AC
  Administered 2024-02-26: 100 mg via ORAL
  Filled 2024-02-25: qty 1

## 2024-02-25 MED ORDER — IOHEXOL 350 MG/ML SOLN
75.0000 mL | Freq: Once | INTRAVENOUS | Status: AC | PRN
  Administered 2024-02-25: 75 mL via INTRAVENOUS

## 2024-02-25 NOTE — Evaluation (Signed)
 Occupational Therapy Evaluation Patient Details Name: [name] MRN: [id number] DOB: 1985/07/21 Today's Date: 02/25/2024   History of Present Illness   38 y/o Female admitted 02/23/24 with general body ache, tachycardia, rhinovirus, sepsis, CAP.  PMHx: asthma, HIV, depression.     Clinical Impressions Pt admitted with the above concerns. Pt currently with functional limitations due to the deficits listed below (see OT Problem List). At baseline, pt is able to complete all ADL tasks and functional mobility independently. Recently began to experience a progressive decline requiring increased assistance for daily tasks. Pt will benefit from acute skilled OT to increase their safety and independence with ADL and functional mobility for ADL to facilitate discharge. Pt would benefit from follow up home health OT services to further focus on mentioned deficits.       If plan is discharge home, recommend the following:   A little help with walking and/or transfers;A little help with bathing/dressing/bathroom;Help with stairs or ramp for entrance;Assistance with cooking/housework;Assist for transportation     Functional Status Assessment   Patient has had a recent decline in their functional status and demonstrates the ability to make significant improvements in function in a reasonable and predictable amount of time.     Equipment Recommendations   BSC/3in1      Precautions/Restrictions   Precautions Precautions: Fall;Other (comment) Recall of Precautions/Restrictions: Intact Precaution/Restrictions Comments: watch sats Restrictions Weight Bearing Restrictions Per Provider Order: No     Mobility Bed Mobility Overal bed mobility:  (see PT eval for bed mobility)     Transfers Overall transfer level:  (See PT eval for transfer level)         Balance      Standing balance comment: Standing deferred d/t drowsiness from pain meds.      ADL either performed or  assessed with clinical judgement   ADL       Grooming: Set up;Bed level   Upper Body Bathing: Minimal assistance   Lower Body Bathing: Minimal assistance   Upper Body Dressing : Minimal assistance   Lower Body Dressing: Minimal assistance       Toileting- Clothing Manipulation and Hygiene: Minimal assistance               Vision Baseline Vision/History: 1 Wears glasses Ability to See in Adequate Light: 0 Adequate Patient Visual Report: No change from baseline Additional Comments: Does not have glasses. They were broken not too long ago and she has yet to get a new pair.     Perception Perception: Not tested       Praxis Praxis: Not tested       Pertinent Vitals/Pain Pain Assessment Pain Assessment: Faces Faces Pain Scale: No hurt (at rest in bed. Pt reports recently receiving pain meds.)     Extremity/Trunk Assessment Upper Extremity Assessment Upper Extremity Assessment: Right hand dominant;Overall WFL for tasks assessed   Lower Extremity Assessment Lower Extremity Assessment: Defer to PT evaluation   Cervical / Trunk Assessment Cervical / Trunk Assessment: Normal (guarding due to pain)   Communication Communication Communication: No apparent difficulties   Cognition Arousal: Alert (slightly drowsy) Behavior During Therapy: WFL for tasks assessed/performed Cognition: No apparent impairments      Following commands: Intact       Cueing  General Comments   Cueing Techniques: Verbal cues              Home Living Family/patient expects to be discharged to:: Private residence Living Arrangements: Spouse/significant other;Children Available Help at  Discharge: Family;Available PRN/intermittently Type of Home: House       Home Layout: One level     Bathroom Shower/Tub: Tub/shower unit   Bathroom Toilet: Standard     Home Equipment: None          Prior Functioning/Environment Prior Level of Function : Independent/Modified  Independent  Mobility Comments: Since Sunday 11/30, pt has been holding furniture and requiring assistance ADLs Comments: Since Sunday 11/30, pt has been requiring assistance.    OT Problem List: Decreased activity tolerance;Impaired balance (sitting and/or standing)   OT Treatment/Interventions: Self-care/ADL training;Therapeutic exercise;Therapeutic activities;Energy conservation;DME and/or AE instruction;Patient/family education;Balance training      OT Goals(Current goals can be found in the care plan section)   Acute Rehab OT Goals Patient Stated Goal: to get better OT Goal Formulation: With patient Time For Goal Achievement: 03/10/24 Potential to Achieve Goals: Good   OT Frequency:  Min 2X/week       AM-PAC OT 6 Clicks Daily Activity     Outcome Measure Help from another person eating meals?: None Help from another person taking care of personal grooming?: None Help from another person toileting, which includes using toliet, bedpan, or urinal?: A Little Help from another person bathing (including washing, rinsing, drying)?: A Little Help from another person to put on and taking off regular upper body clothing?: A Little Help from another person to put on and taking off regular lower body clothing?: A Little 6 Click Score: 20   End of Session    Activity Tolerance: Patient limited by fatigue Patient left: in bed;with call bell/phone within reach;with family/visitor present  OT Visit Diagnosis: Unsteadiness on feet (R26.81);Muscle weakness (generalized) (M62.81)                Time: 8488-8477 OT Time Calculation (min): 11 min Charges:  OT General Charges $OT Visit: 1 Visit OT Evaluation $OT Eval Low Complexity: 1 Low  [name], OTR/L,CBIS  Supplemental OT - [facility] and [facility] Secure Chat Preferred    Gehrig Patras, Leita BIRCH 02/25/2024, 3:55 PM

## 2024-02-25 NOTE — Progress Notes (Signed)
 PROGRESS NOTE  [name] FMW:[id number] DOB: 1985/06/30 DOA: 02/23/2024 PCP: Gareth Mliss FALCON, FNP   LOS: 1 day   Brief Narrative / Interim history: 38 year old patient with HIV, asthma, depression, recently diagnosed pneumonia and on antibiotics comes into the hospital with persistent fever, chills, fatigue and generalized weakness.  She tells me that when EMS arrived at her home she was too weak that she could not ambulate.  X-ray on admission showed multifocal pneumonia, she was placed on antibiotics and admitted to the hospital.  RVP was positive for rhinovirus.  ID was consulted in the setting of underlying HIV as she was not taking her ART  Patient does not want her diagnoses and medical care disclosed to any family members  Subjective / 24h Interval events: She tells me she has persistent bilateral lower extremity weakness today.  She says she could not walk when EMS arrived at her home.  She has been complaining of lower abdominal/pelvic fullness as well as pain when she got moved in bed.  Complains of a cough, denies any significant shortness of breath  Assesement and Plan: Principal problem Sepsis due to multifocal pneumonia, rhinovirus infection -her pneumonia is probably due to rhinovirus infection, however she is at risk for multiple other pathogens given very low CD4 count. - ID consulted, appreciate input.  Active problems HIV-has not been taking her ART, CD4, undetectably low, viral load 17,000  Acute hypoxic respiratory failure, asthma exacerbation-patient was hypoxemic on arrival requiring 2 L supplemental oxygen, also tachypneic with respiratory rate up to mid 30s.  Felt like she was wheezing on admission, and was started on steroids.  Wheezing appears to have resolved today, change steroids to p.o. and do a quick taper.  Wean off to room air as tolerated, she is not as tachypneic today as on admission  Lower extremity weakness -bilateral, also with inability to  urinate and urinary retention.  Obtain MRI of the lumbar spine.  She has been describing some fullness in her abdomen as well, with significant pain with movement, obtain CT scan  Presumed bacterial vaginosis, vulvovaginal candidiasis-on vaginal metronidazole , received fluconazole  x 1  Scheduled Meds:  Chlorhexidine  Gluconate Cloth  6 each Topical Q0600   enoxaparin  (LOVENOX ) injection  40 mg Subcutaneous Q24H   hydrocortisone   1 Application Rectal BID   metroNIDAZOLE   1 Applicatorful Vaginal QHS   [START ON 02/26/2024] predniSONE   40 mg Oral Q breakfast   Continuous Infusions:  cefTRIAXone  (ROCEPHIN )  IV 1 g (02/24/24 2240)   PRN Meds:.acetaminophen , ipratropium-albuterol , ondansetron  (ZOFRAN ) IV, oxyCODONE   Current Outpatient Medications  Medication Instructions   albuterol  (VENTOLIN  HFA) 108 (90 Base) MCG/ACT inhaler 2 puffs, Inhalation, Every 6 hours PRN   azithromycin  (ZITHROMAX ) 250 MG tablet Take 2 tablets (500 mg total) by mouth daily for 1 day, THEN 1 tablet (250 mg total) daily for 4 days.   cefixime  (SUPRAX ) 400 mg, Oral, Daily   fluconazole  (DIFLUCAN ) 150 MG tablet Take 1 tablet today.  Take second tablet 3 days later.   metroNIDAZOLE  (METROGEL ) 0.75 % vaginal gel Insert vaginally using applicator nightly for 5 nights.  Abstain from sexual intercourse or tampon use until treatment is complete.   Multiple Vitamins-Minerals (WOMENS MULTIVITAMIN) TABS 1 tablet, Daily   ondansetron  (ZOFRAN -ODT) 4 mg, Oral, Every 8 hours PRN    Diet Orders (From admission, onward)     Start     Ordered   02/24/24 1809  Diet regular Room service appropriate? Yes; Fluid consistency: Thin  Diet  effective now       Question Answer Comment  Room service appropriate? Yes   Fluid consistency: Thin      02/24/24 1809            DVT prophylaxis: enoxaparin  (LOVENOX ) injection 40 mg Start: 02/24/24 1045   Lab Results  Component Value Date   PLT 416 (H) 02/25/2024      Code Status: Full  Code  Family Communication: no family at bedside   Status is: Inpatient Remains inpatient appropriate because: Severity of illness, lower extremity weakness, IV antibiotics   Level of care: Telemetry  Consultants:  ID  Objective: Vitals:   02/24/24 1707 02/24/24 2127 02/25/24 0334 02/25/24 0845  BP: 111/79 (!) 110/90 (!) 128/94 114/80  Pulse: 93 99 80 89  Resp: 19 19 19 18   Temp: 98.1 F (36.7 C) 97.6 F (36.4 C) 97.9 F (36.6 C) 97.6 F (36.4 C)  TempSrc:      SpO2: 100% 100% 98% 96%  Weight:      Height:        Intake/Output Summary (Last 24 hours) at 02/25/2024 1049 Last data filed at 02/25/2024 0845 Gross per 24 hour  Intake 1080 ml  Output 475 ml  Net 605 ml   Wt Readings from Last 3 Encounters:  02/24/24 73 kg  06/17/23 73.9 kg  11/18/22 76 kg    Examination:  Constitutional: NAD Eyes: no scleral icterus ENMT: Mucous membranes are moist.  Neck: normal, supple Respiratory: clear to auscultation bilaterally, no wheezing, no crackles.  Cardiovascular: Regular rate and rhythm, no murmurs / rubs / gallops. No LE edema.  Abdomen: non distended, no tenderness. Bowel sounds positive.  Musculoskeletal: no clubbing / cyanosis.   Data Reviewed: I have independently reviewed following labs and imaging studies   CBC Recent Labs  Lab 02/20/24 1918 02/23/24 1035 02/23/24 1055 02/25/24 0531  WBC 6.3 8.6  --  3.7*  HGB 10.3* 9.5* 10.2*  10.5* 9.1*  HCT 33.1* 30.8* 30.0*  31.0* 29.1*  PLT 422* 373  --  416*  MCV 85.5 84.6  --  83.4  MCH 26.6 26.1  --  26.1  MCHC 31.1 30.8  --  31.3  RDW 13.7 13.9  --  14.0  LYMPHSABS 0.6* 0.6*  --   --   MONOABS 0.4 0.6  --   --   EOSABS 0.1 0.0  --   --   BASOSABS 0.0 0.0  --   --     Recent Labs  Lab 02/20/24 1918 02/23/24 1035 02/23/24 1055 02/23/24 1056 02/25/24 0531  NA 137 137 135  135  --  138  K 3.5 3.4* 4.7  4.6  --  4.1  CL 102 97* 96*  --  101  CO2 23 28  --   --  27  GLUCOSE 114* 98 97  --   136*  BUN 8 5* 6  --  8  CREATININE 0.68 0.82 0.80  --  0.47  CALCIUM 8.2* 8.4*  --   --  8.3*  AST 23 23  --   --  17  ALT 7 8  --   --  8  ALKPHOS 91 76  --   --  72  BILITOT 0.4 0.6  --   --  <0.2  ALBUMIN 2.1* 1.8*  --   --  1.6*  MG  --   --   --   --  2.0  LATICACIDVEN  --   --   --  1.3  --     ------------------------------------------------------------------------------------------------------------------ No results for input(s): CHOL, HDL, LDLCALC, TRIG, CHOLHDL, LDLDIRECT in the last 72 hours.  Lab Results  Component Value Date   HGBA1C 5.6 04/08/2022   ------------------------------------------------------------------------------------------------------------------ No results for input(s): TSH, T4TOTAL, T3FREE, THYROIDAB in the last 72 hours.  Invalid input(s): FREET3  Cardiac Enzymes No results for input(s): CKMB, TROPONINI, MYOGLOBIN in the last 168 hours.  Invalid input(s): CK ------------------------------------------------------------------------------------------------------------------ No results found for: BNP  CBG: No results for input(s): GLUCAP in the last 168 hours.  Recent Results (from the past 240 hours)  Urine Culture     Status: Abnormal   Collection Time: 02/20/24  5:57 PM   Specimen: Urine, Clean Catch  Result Value Ref Range Status   Specimen Description URINE, CLEAN CATCH  Final   Special Requests   Final    NONE Performed at [facility], [address], [place], [place] [address]    Culture MULTIPLE SPECIES PRESENT, SUGGEST RECOLLECTION (A)  Final   Report Status 02/21/2024 FINAL  Final  Culture, blood (routine x 2)     Status: None (Preliminary result)   Collection Time: 02/23/24 10:16 AM   Specimen: BLOOD  Result Value Ref Range Status   Specimen Description BLOOD RIGHT ANTECUBITAL  Final   Special Requests   Final    BOTTLES DRAWN AEROBIC AND ANAEROBIC Blood Culture adequate volume    Culture   Final    NO GROWTH 2 DAYS Performed at [facility], [address] [place], [place] [address]    Report Status PENDING  Incomplete  Resp panel by RT-PCR (RSV, Flu A&B, Covid) Anterior Nasal Swab     Status: None   Collection Time: 02/23/24 10:17 AM   Specimen: Anterior Nasal Swab  Result Value Ref Range Status   SARS Coronavirus 2 by RT PCR NEGATIVE NEGATIVE Final   Influenza A by PCR NEGATIVE NEGATIVE Final   Influenza B by PCR NEGATIVE NEGATIVE Final    Comment: (NOTE) The Xpert Xpress SARS-CoV-2/FLU/RSV plus assay is intended as an aid in the diagnosis of influenza from Nasopharyngeal swab specimens and should not be used as a sole basis for treatment. Nasal washings and aspirates are unacceptable for Xpert Xpress SARS-CoV-2/FLU/RSV testing.  Fact Sheet for Patients: bloggercourse.com  Fact Sheet for Healthcare Providers: seriousbroker.it  This test is not yet approved or cleared by the [place]  FDA and has been authorized for detection and/or diagnosis of SARS-CoV-2 by FDA under an Emergency Use Authorization (EUA). This EUA will remain in effect (meaning this test can be used) for the duration of the COVID-19 declaration under Section 564(b)(1) of the Act, 21 U.S.C. section 360bbb-3(b)(1), unless the authorization is terminated or revoked.     Resp Syncytial Virus by PCR NEGATIVE NEGATIVE Final    Comment: (NOTE) Fact Sheet for Patients: bloggercourse.com  Fact Sheet for Healthcare Providers: seriousbroker.it  This test is not yet approved or cleared by the [place]  FDA and has been authorized for detection and/or diagnosis of SARS-CoV-2 by FDA under an Emergency Use Authorization (EUA). This EUA will remain in effect (meaning this test can be used) for the duration of the COVID-19 declaration under Section 564(b)(1) of the Act, 21  U.S.C. section 360bbb-3(b)(1), unless the authorization is terminated or revoked.  Performed at [facility], [address], [place], [place] [address]   Respiratory (~20 pathogens) panel by PCR     Status: Abnormal   Collection Time:  02/23/24  9:31 PM   Specimen: Nasopharyngeal Swab; Respiratory  Result Value Ref Range Status   Adenovirus NOT DETECTED NOT DETECTED Final   Coronavirus 229E NOT DETECTED NOT DETECTED Final    Comment: (NOTE) The Coronavirus on the Respiratory Panel, DOES NOT test for the novel  Coronavirus (2019 nCoV)    Coronavirus HKU1 NOT DETECTED NOT DETECTED Final   Coronavirus NL63 NOT DETECTED NOT DETECTED Final   Coronavirus OC43 NOT DETECTED NOT DETECTED Final   Metapneumovirus NOT DETECTED NOT DETECTED Final   Rhinovirus / Enterovirus DETECTED (A) NOT DETECTED Final   Influenza A NOT DETECTED NOT DETECTED Final   Influenza B NOT DETECTED NOT DETECTED Final   Parainfluenza Virus 1 NOT DETECTED NOT DETECTED Final   Parainfluenza Virus 2 NOT DETECTED NOT DETECTED Final   Parainfluenza Virus 3 NOT DETECTED NOT DETECTED Final   Parainfluenza Virus 4 NOT DETECTED NOT DETECTED Final   Respiratory Syncytial Virus NOT DETECTED NOT DETECTED Final   Bordetella pertussis NOT DETECTED NOT DETECTED Final   Bordetella Parapertussis NOT DETECTED NOT DETECTED Final   Chlamydophila pneumoniae NOT DETECTED NOT DETECTED Final   Mycoplasma pneumoniae NOT DETECTED NOT DETECTED Final    Comment: Performed at [facility], [address], [place], [place] [address]  Culture, blood (routine x 2)     Status: None (Preliminary result)   Collection Time: 02/23/24 10:09 PM   Specimen: BLOOD  Result Value Ref Range Status   Specimen Description BLOOD RIGHT ANTECUBITAL  Final   Special Requests   Final    BOTTLES DRAWN AEROBIC AND ANAEROBIC Blood Culture adequate volume   Culture   Final    NO GROWTH 2 DAYS Performed at [facility], [address],  [place], [place] [address]    Report Status PENDING  Incomplete     Radiology Studies: No results found.   Nilda Fendt, MD, PhD [facility]  Between 7 am - 7 pm I am available, please contact me via Amion (for emergencies) or Securechat (non urgent messages)  Between 7 pm - 7 am I am not available, please contact night coverage MD/APP via Amion

## 2024-02-25 NOTE — TOC CM/SW Note (Signed)
 Transition of Care [facility]) - Inpatient Brief Assessment   Patient Details  Name: [name] MRN: [id number] Date of Birth: 07/14/1985  Transition of Care [facility]) CM/SW Contact:    Tom-Johnson, Harvest Muskrat, RN Phone Number: 02/25/2024, 10:09 AM   Clinical Narrative:  Patient presented to the ED with Generalized Weakness, Fatigue, Body aches, Cough, Fever and Shortness of Breath. Patient found to be Hypoxic,  Tachycardic, and Tachypneic in the ED. Patient was recently seen at the Urgent Care for same symptoms, treated and sent home on oral abx. CXR showed bilateral Lung Interstitial Opacities, admitted with Community Acquired Pneumonia, on IV abx, Neb tx and Solumedrol. RVP positive for Rhinovirus, ID consulted. On 2L O2 acute. Has hx of Asthma, HIV non compliant with medication regimen, Depression, Hemorrhoids, Vulvovaginal Candidiasis and Bacterial Vaginosis.    CM spoke with patient at bedside about needs for post hospital transition. Patient sates she lives with her four children and their father, both are not together just living in the same space. Has three supportive siblings. Not employed, on disability. Drives self, does not have DME's at home. Source of income is through SSI. Does not have a PCP, new patient establishment scheduled, info on AVS. Uses [facility] on [address]  [place] check for [facility] by [facility] shows $0 co-pay.   Patient not Medically ready for discharge.  CM will continue to follow as patient progresses with care towards discharge.         Transition of Care Asessment: Insurance and Status: Insurance coverage has been reviewed Patient has primary care physician: Yes Home environment has been reviewed: Yes Prior level of function:: Independent Prior/Current Home Services: No current home services Social Drivers of Health Review: SDOH reviewed no interventions necessary Readmission risk has been reviewed: Yes Transition of care  needs: no transition of care needs at this time

## 2024-02-25 NOTE — Consult Note (Signed)
 [facility]                                                                                        Patient Identification: Patient Name: [name] MRN: [id number] Admit Date: 02/23/2024 10:02 AM Today's Date: 02/25/2024 Reason for consult: HIV not on ART  Requesting provider: Dr Kathrin  Principal Problem:   CAP (community acquired pneumonia)   Antibiotics:  Azithromycin   Cefepime  12/3, ceftriaxone  12/3- Vancomycin  12/3 Fluconazole  12/4  Lines/Hardware:  Assessment # AIDS -not in ART likely years based on refill, last ART was epzicom  and BID prezista  600, BID norvir  100 back in April 2018.  Concerns for integrase inhibitor resistance in the past noted although not confirmed in genotype  Lab Results  Component Value Date   HIV1RNAQUANT 17,000 02/23/2024   Lab Results  Component Value Date   CD4TABS <35 (L) 02/23/2024   CD4TABS 540 04/24/2014   CD4TABS 570 10/05/2013   # Possible CAP # Rhino virus, entero virus + - seems to be improving on current management  - at risk for PJP PNA, and other opportunistic infections however  # B/L LE weakness, urinary retention  - She reported Dr. Trixie bilateral lower extremity weakness and was not able to walk when EMS arrived at her home as well as lower abdominal fullness/pain  # Vulvovaginal candidiasis s/p fluconazole   # BV: on vaginal metronidazole    Of note, patient does not want about her HIV diagnosis as well as medical care disclosed to any family members  Recommendations  - Continue treatment with azithromycin  and ceftriaxone  for now  - Steroid per primary team for possible asthma exacerbation  - Recommend to get CT CAP as well as MRI lumbar spine to evaluate lungs, abdominal fullness, urinary retention and ? lower extremity weakness - Would like to fu on imaging from today and plan to start ART afterwards. She prefers to  start single pill regimen if at all possible  - Urine GC, respiratory cultures, PJP stain, fungitell, HIV genotype - Will not start on PJP prophylaxis yet while treating for PNA - Droplet precautions  D/w Dr Trixie Dr Fleeta Rothman to follow this weekend. I am back Monday    Rest of the management as per the primary team. Please call with questions or concerns.  Thank you for the consult  __________________________________________________________________________________________________________ HPI and Hospital Course: 38 year old female with prior history of HIV, asthma, postpartum depression who presented to the ED on 12/3 brought in by EMS for generalized weakness, diffuse body ache, cough, fever.  She was apparently feeling not well for a week prior to arrival.  She was not able to keep anything down  Seen on 11/30 for nausea, vomiting or diarrhea.  Workup remarkable for being febrile.  Unremarkable BMP, albumin 1.8, hemoglobin 9.5.  Candida vaginitis and bacterial vaginitis positive.  UA unremarkable for UTI. CXR Fine reticular opacities throughout both lungs, worrisome for a atypical infection or viral pneumonia. No lobar pneumonia or pleural effusion.  Was given IVF, Zofran , thought to have asthma exacerbation.  Discharged on cefixime , azithromycin , ondansetron .    Symptoms continue to worsen  despite taking cefixime  and azithromycin  which she was taking as prescribed  At ED febrile, tachypneic, tachycardic, hypoxic Labs remarkable for K3.4, albumin 1.8, WBC 8.6, hemoglobin 9.5 Influenza A/influenza B/RSV/SARS-CoV-2 negative.  RVP positive for rhinovirus, enterovirus  UA with 15 ketones, 100 protein, rare bacteria RBC 0-5 and WBC  12/3 blood culture 2/2 sets NGTD  Started on IVF as well as antibiotics with vancomycin  and cefepime , Solu-Medrol   CXR  Mid and lower lung symmetric interstitial opacities, nonspecific for atypical pneumonia or early edema.  Per last ID note in 07/03/16   HIV History:  Diagnosed in 2011 or 2012 during pregnancy, started on ART, stopped after pregnancy due to her preference and high-ish CD4. Restarted on ART 05/2011, stop her meds upon getting pregnant later that year due to misunderstanding, restarted during second trimester. Some ART stops and starts afterwards due to med intolerance. All HIV care at [facility] until transfer here 04/24/2016.  Restarted on ART 04/24/16 with epzicom  + BID darunavir -ritonavir  No known OIs. [id number] neg 10/05/13. Gentoypes: 08/20/10, 07/19/09: pan sens; 04/24/16 RT/protease genotype (off ART) pan sens; integrase genotype and subsequent genosure archive failed due to low viral load. I do have concern for integrase resistance since she had been on tivicay  monotherapy for an unclear amount of time in 2016. Prior ART regmens: combovir/kaletra 2012. Truvada /epzicom  2013. Truvada +tivicay  02-2013-2015, tivicay  alone 2016. As of last HIV visit  04/26/14.  On tivicay  monotherapy at that time for several months after self d/c'ing truavda due to stomach "feeling funny", viral load <20 04/24/14 on this. Continued on monotherapy with plan that on establishing in new HIV clinic in [place] the next week there would be a consideration of what new regimen to put her on.  However, never moved to [place], fell out of HIV care. Reengaged 04/2016 after presenting to OB-GYN in second trimester.  ART reactions: diarrhea on atazanavir -ritonavir  + truvada . "funny feeling in stomach" on truvada +tivicay , resolved with self-d/c of truvada .  Restarted ART at around [redacted] weeks gestation with daily epzicomand BID prezista  600, BID norvir  100 ~05/01/16  She reports she has been not taking ART for  a while and one of the reason being need to take multiple ART's.  She reports recently moved from [place] to [place] in October and currently living with someone.  Denies being sexually active.  Denies smoking alcohol or recreational drugs.  She does not want  anyone in her family members to know about her HIV diagnosis.   ROS: General- Denies fever, chills, loss of appetite and loss of weight HEENT - Denies headache, blurry vision, neck pain, sinus pain Chest - Denies any chest pain, SOB +, productive cough + CVS- Denies any dizziness/lightheadedness, syncopal attacks, palpitations Abdomen- Denies any nausea, vomiting, hematochezia and , abdominal fullness+ Neuro - Denies any weakness, numbness, tingling sensation Psych - Denies any changes in mood irritability or depressive symptoms GU- Denies any burning, dysuria, hematuria or increased frequency of urination Skin - denies any rashes/lesions MSK - denies any joint pain/swelling or restricted ROM   Past Medical History:  Diagnosis Date   Asthma    Depression    Postpartum after 1st pregnancy   Hemorrhoids    HIV positive (HCC)    Past Surgical History:  Procedure Laterality Date   DILATION AND CURETTAGE OF UTERUS     LAPAROSCOPY  01/06/2011   Procedure: LAPAROSCOPY OPERATIVE;  Surgeon: Olam DELENA Mill, MD;  Location: [facility];  Service: Gynecology;  Laterality: N/A;   LAPAROTOMY  01/06/2011   Procedure: LAPAROTOMY;  Surgeon: Olam DELENA Mill, MD;  Location: [facility];  Service: Gynecology;  Laterality: N/A;   SALPINGOOPHORECTOMY    '  Scheduled Meds:  azithromycin   500 mg Oral Daily   Chlorhexidine  Gluconate Cloth  6 each Topical Q0600   enoxaparin  (LOVENOX ) injection  40 mg Subcutaneous Q24H   hydrocortisone   1 Application Rectal BID   methylPREDNISolone  (SOLU-MEDROL ) injection  40 mg Intravenous Q12H   metroNIDAZOLE   1 Applicatorful Vaginal QHS   Continuous Infusions:  cefTRIAXone  (ROCEPHIN )  IV 1 g (02/24/24 2240)   PRN Meds:.acetaminophen , ipratropium-albuterol , ondansetron  (ZOFRAN ) IV, oxyCODONE   Allergies  Allergen Reactions   Benadryl  [Diphenhydramine ] Itching   Penicillins Other (See Comments)    Unknown reaction     Social History   Socioeconomic  History   Marital status: Single    Spouse name: Not on file   Number of children: Not on file   Years of education: Not on file   Highest education level: Not on file  Occupational History   Not on file  Tobacco Use   Smoking status: Former    Current packs/day: 0.00    Average packs/day: 0.5 packs/day for 7.0 years (3.5 ttl pk-yrs)    Types: Cigarettes    Start date: 03/28/2004    Quit date: 03/29/2011    Years since quitting: 12.9   Smokeless tobacco: Never  Vaping Use   Vaping status: Never Used  Substance and Sexual Activity   Alcohol use: No   Drug use: No    Comment: denies mj use   Sexual activity: Yes    Birth control/protection: Condom, None  Other Topics Concern   Not on file  Social History Narrative   Not on file   Social Drivers of Health   Financial Resource Strain: Not on file  Food Insecurity: No Food Insecurity (02/24/2024)   Hunger Vital Sign    Worried About Running Out of Food in the Last Year: Never true    Ran Out of Food in the Last Year: Never true  Transportation Needs: No Transportation Needs (02/24/2024)   PRAPARE - Administrator, Civil Service (Medical): No    Lack of Transportation (Non-Medical): No  Physical Activity: Not on file  Stress: Not on file  Social Connections: Not on file  Intimate Partner Violence: Not At Risk (02/24/2024)   Humiliation, Afraid, Rape, and Kick questionnaire    Fear of Current or Ex-Partner: No    Emotionally Abused: No    Physically Abused: No    Sexually Abused: No   Family History  Problem Relation Age of Onset   Diabetes Mother    Hypertension Mother    Hyperlipidemia Mother    Kidney disease Mother    Diabetes Brother    Diabetes Maternal Aunt    Hypertension Maternal Aunt    Asthma Daughter    Vitals BP (!) 128/94 (BP Location: Left Arm)   Pulse 80   Temp 97.9 F (36.6 C)   Resp 19   Ht 5' 2 (1.575 m)   Wt 73 kg   LMP 01/24/2024   SpO2 98%   BMI 29.45 kg/m    Physical  Exam Constitutional:  adult female lying in the bed, nontoxic-appearing    Comments: HEENT no thrush  Cardiovascular:     Rate and Rhythm: Normal rate      Heart sounds: S1 and S2  Pulmonary:     Effort: Pulmonary effort is normal.  Comments: Bibasilar rales  Abdominal:     Palpations: Abdomen is soft.     Tenderness: Nondistended and nontender  Musculoskeletal:        General: No swelling or tenderness in peripheral joints  Skin:    Comments no rashes  Neurological:     General: Awake alert and oriented, diffuse generalized weakness,   Psychiatric:        Mood and Affect: Mood normal.    Pertinent Microbiology Results for orders placed or performed during the hospital encounter of 02/23/24  Culture, blood (routine x 2)     Status: None (Preliminary result)   Collection Time: 02/23/24 10:16 AM   Specimen: BLOOD  Result Value Ref Range Status   Specimen Description BLOOD RIGHT ANTECUBITAL  Final   Special Requests   Final    BOTTLES DRAWN AEROBIC AND ANAEROBIC Blood Culture adequate volume   Culture   Final    NO GROWTH < 24 HOURS Performed at [facility], [address], [place], [place] [address]    Report Status PENDING  Incomplete  Resp panel by RT-PCR (RSV, Flu A&B, Covid) Anterior Nasal Swab     Status: None   Collection Time: 02/23/24 10:17 AM   Specimen: Anterior Nasal Swab  Result Value Ref Range Status   SARS Coronavirus 2 by RT PCR NEGATIVE NEGATIVE Final   Influenza A by PCR NEGATIVE NEGATIVE Final   Influenza B by PCR NEGATIVE NEGATIVE Final    Comment: (NOTE) The Xpert Xpress SARS-CoV-2/FLU/RSV plus assay is intended as an aid in the diagnosis of influenza from Nasopharyngeal swab specimens and should not be used as a sole basis for treatment. Nasal washings and aspirates are unacceptable for Xpert Xpress SARS-CoV-2/FLU/RSV testing.  Fact Sheet for Patients: bloggercourse.com  Fact Sheet for Healthcare  Providers: seriousbroker.it  This test is not yet approved or cleared by the [place]  FDA and has been authorized for detection and/or diagnosis of SARS-CoV-2 by FDA under an Emergency Use Authorization (EUA). This EUA will remain in effect (meaning this test can be used) for the duration of the COVID-19 declaration under Section 564(b)(1) of the Act, 21 U.S.C. section 360bbb-3(b)(1), unless the authorization is terminated or revoked.     Resp Syncytial Virus by PCR NEGATIVE NEGATIVE Final    Comment: (NOTE) Fact Sheet for Patients: bloggercourse.com  Fact Sheet for Healthcare Providers: seriousbroker.it  This test is not yet approved or cleared by the [place]  FDA and has been authorized for detection and/or diagnosis of SARS-CoV-2 by FDA under an Emergency Use Authorization (EUA). This EUA will remain in effect (meaning this test can be used) for the duration of the COVID-19 declaration under Section 564(b)(1) of the Act, 21 U.S.C. section 360bbb-3(b)(1), unless the authorization is terminated or revoked.  Performed at [facility], [address], [place], [place] [address]   Respiratory (~20 pathogens) panel by PCR     Status: Abnormal   Collection Time: 02/23/24  9:31 PM   Specimen: Nasopharyngeal Swab; Respiratory  Result Value Ref Range Status   Adenovirus NOT DETECTED NOT DETECTED Final   Coronavirus 229E NOT DETECTED NOT DETECTED Final    Comment: (NOTE) The Coronavirus on the Respiratory Panel, DOES NOT test for the novel  Coronavirus (2019 nCoV)    Coronavirus HKU1 NOT DETECTED NOT DETECTED Final   Coronavirus NL63 NOT DETECTED NOT DETECTED Final   Coronavirus OC43 NOT DETECTED NOT DETECTED Final   Metapneumovirus NOT DETECTED NOT DETECTED Final  Rhinovirus / Enterovirus DETECTED (A) NOT DETECTED Final   Influenza A NOT DETECTED NOT DETECTED Final   Influenza B NOT  DETECTED NOT DETECTED Final   Parainfluenza Virus 1 NOT DETECTED NOT DETECTED Final   Parainfluenza Virus 2 NOT DETECTED NOT DETECTED Final   Parainfluenza Virus 3 NOT DETECTED NOT DETECTED Final   Parainfluenza Virus 4 NOT DETECTED NOT DETECTED Final   Respiratory Syncytial Virus NOT DETECTED NOT DETECTED Final   Bordetella pertussis NOT DETECTED NOT DETECTED Final   Bordetella Parapertussis NOT DETECTED NOT DETECTED Final   Chlamydophila pneumoniae NOT DETECTED NOT DETECTED Final   Mycoplasma pneumoniae NOT DETECTED NOT DETECTED Final    Comment: Performed at [facility], [address] [place], [place] [address]  Culture, blood (routine x 2)     Status: None (Preliminary result)   Collection Time: 02/23/24 10:09 PM   Specimen: BLOOD  Result Value Ref Range Status   Specimen Description BLOOD RIGHT ANTECUBITAL  Final   Special Requests   Final    BOTTLES DRAWN AEROBIC AND ANAEROBIC Blood Culture adequate volume   Culture   Final    NO GROWTH < 12 HOURS Performed at [facility], [address] [place], [place] [address]    Report Status PENDING  Incomplete   Pertinent Lab seen by me:    Latest Ref Rng & Units 02/23/2024   10:55 AM 02/23/2024   10:35 AM 02/20/2024    7:18 PM  CBC  WBC 4.0 - 10.5 K/uL  8.6  6.3   Hemoglobin 12.0 - 15.0 g/dL 87.9 - 84.9 g/dL 89.4    89.7  9.5  89.6   Hematocrit 36.0 - 46.0 % 36.0 - 46.0 % 31.0    30.0  30.8  33.1   Platelets 150 - 400 K/uL  373  422       Latest Ref Rng & Units 02/23/2024   10:55 AM 02/23/2024   10:35 AM 02/20/2024    7:18 PM  CMP  Glucose 70 - 99 mg/dL 97  98  885   BUN 6 - 20 mg/dL 6  5  8    Creatinine 0.44 - 1.00 mg/dL 9.19  9.17  9.31   Sodium 135 - 145 mmol/L 135 - 145 mmol/L 135    135  137  137   Potassium 3.5 - 5.1 mmol/L 3.5 - 5.1 mmol/L 4.6    4.7  3.4  3.5   Chloride 98 - 111 mmol/L 96  97  102   CO2 22 - 32 mmol/L  28  23   Calcium 8.9 - 10.3 mg/dL  8.4  8.2   Total Protein 6.5 - 8.1  g/dL  7.0  7.7   Total Bilirubin 0.0 - 1.2 mg/dL  0.6  0.4   Alkaline Phos 38 - 126 U/L  76  91   AST 15 - 41 U/L  23  23   ALT 0 - 44 U/L  8  7    08/20/2010 Genotype  HIV-1 Genotyping, DNA Sequencing  See Comment CM  Comment: DRUG RESISTANCE MUTATION ANALYSIS      RESISTANCE INTERPRETATION -----------------------------------  ------------------------------ abacavir  (ABC)                         No Evidence of Resistance didanosine (ddl)                       No  Evidence of Resistance lamivudine (3TC)/emtricitabine (FTC)     No Evidence of Resistance stavudine (d4T)                        No Evidence of Resistance tenofovir  (TDF)                        No Evidence of Resistance zidovudine  (AZT)                       No Evidence of Resistance efavirenz (EFV)                        No Evidence of Resistance etravirine (ETR)                       No Evidence of Resistance nevirapine (NVP)                       No Evidence of Resistance atazanavir  (ATV)                       No Evidence of Resistance   ATV/r**                              No Evidence of Resistance darunavir  + ritonavir (DRV/r)           No Evidence of Resistance fosamprenavir (FPV)                    No Evidence of Resistance   FPV/r**                              No Evidence of Resistance indinavir (IDV)                        No Evidence of Resistance   IDV/r**                              No Evidence of Resistance lopinavir + ritonavir  (LPV/r)          No Evidence of Resistance nelfinavir (NFV)                       No Evidence of Resistance saquinavir + ritonavir  (SQV/r)         No Evidence of Resistance tipranavir + ritonavir  (TPV/r)         No Evidence of Resistance     07/19/2009 Tests: (1) HIV 1 Genotyping, DNA Sequencing (16684)   HIV 1 Genotyping, DNA Seq                              See Comment      DRUG RESISTANCE MUTATION ANALYSIS      RESISTANCE INTERPRETATION      -----------------------------------   ------------------------------      abacavir  (ABC)                         No Evidence of Resistance      didanosine (ddl)  No Evidence of Resistance      lamivudine (3TC)/emtricitabine (FTC)     No Evidence of Resistance      stavudine (d4T)                        No Evidence of Resistance      tenofovir  (TDF)                        No Evidence of Resistance      zidovudine  (AZT)                       No Evidence of Resistance      efavirenz (EFV)                        No Evidence of Resistance      etravirine (ETR)                       No Evidence of Resistance      nevirapine (NVP)                       No Evidence of Resistance      atazanavir  (ATV)                       No Evidence of Resistance        ATV/r**                              No Evidence of Resistance      darunavir  + ritonavir (DRV/r)           No Evidence of Resistance      fosamprenavir (FPV)                    No Evidence of Resistance        FPV/r**                              No Evidence of Resistance      indinavir (IDV)                        No Evidence of Resistance        IDV/r**                              No Evidence of Resistance      lopinavir + ritonavir  (LPV/r)          No Evidence of Resistance      nelfinavir (NFV)                       No Evidence of Resistance      saquinavir + ritonavir  (SQV/r)         No Evidence of Resistance      tipranavir + ritonavir  (TPV/r)         No Evidence of Resistance   Pertinent Imagings/Other Imagings Plain films and CT images have been personally visualized and interpreted; radiology reports have been reviewed. Decision making incorporated into the Impression / Recommendations.  DG Chest Port 1 View Result Date: 02/23/2024 CLINICAL DATA:  Fever,  generalized weakness, tachypnea EXAM: PORTABLE CHEST 1 VIEW COMPARISON:  02/02/2016, 02/20/2024 FINDINGS: Mild bilateral mid and lower lung interstitial opacities, nonspecific for atypical or viral  pneumonia versus early edema. Prominent heart size. No effusion or pneumothorax. Trachea midline. No osseous abnormality. IMPRESSION: Mid and lower lung symmetric interstitial opacities, nonspecific for atypical pneumonia or early edema. Electronically Signed   By: CHRISTELLA.  Shick M.D.   On: 02/23/2024 11:01   I spent 85 minutes involved in face-to-face and non-face-to-face activities for this patient on the day of the visit. Professional time spent includes the following activities: Preparing to see the patient (review of tests), Obtaining and reviewing separately obtained history (ED note, H&P, hospitalist progress note), Performing a medically appropriate examination and evaluation, Ordering medications/labs, referring and communicating with other health care professionals, Documenting clinical information in the EMR, Independently interpreting results (not separately reported), Communicating results to the patient, Counseling and educating the patient and Care coordination (not separately reported).  Electronically signed by:   Plan d/w requesting provider as well as ID pharm D  Of note, portions of this note may have been created with voice recognition software. While this note has been edited for accuracy, occasional wrong-word or 'sound-a-like' substitutions may have occurred due to the inherent limitations of voice recognition software.   Annalee Orem, MD Infectious Disease Physician [facility] Pager: [phone]

## 2024-02-25 NOTE — Evaluation (Signed)
 Physical Therapy Evaluation Patient Details Name: [name] MRN: [id number] DOB: 1986-01-21 Today's Date: 02/25/2024  History of Present Illness  38 yo F adm 02/23/24 with general body ache, tachycardia, rhinovirus, sepsis, CAP.  PMHx: asthma, HIV, depression  Clinical Impression  Pt pleasant and eager to move. Pt on 2L on arrival with attempt at RA SPO2 drop to 87% with return to 2L for remainder of session. On 2L at rest SPO2 94% with drop to 87% during limited gait with seated rest to recover. Pt with stiff trunk movement with transfers reporting back pain and decline in function PTA needing assist from daughter to get dressed. Pt with decreased strength, function, transfers, gait and mobility who will benefit from acute therapy to maximize mobility, safety and independence to decrease burden of care. HHPT recommended.         If plan is discharge home, recommend the following: A little help with walking and/or transfers;A little help with bathing/dressing/bathroom;Assistance with cooking/housework;Assist for transportation;Help with stairs or ramp for entrance   Can travel by private vehicle        Equipment Recommendations Rolling walker (2 wheels);BSC/3in1  Recommendations for Other Services  OT consult    Functional Status Assessment Patient has had a recent decline in their functional status and demonstrates the ability to make significant improvements in function in a reasonable and predictable amount of time.     Precautions / Restrictions Precautions Precautions: Fall;Other (comment) Recall of Precautions/Restrictions: Impaired Precaution/Restrictions Comments: watch sats      Mobility  Bed Mobility Overal bed mobility: Needs Assistance Bed Mobility: Supine to Sit     Supine to sit: Min assist, HOB elevated, Used rails     General bed mobility comments: min assist to elevate trunk from surface with HOB 25 degrees, rail and increased time and cues to scoot to  EOB    Transfers Overall transfer level: Needs assistance   Transfers: Sit to/from Stand Sit to Stand: Min assist           General transfer comment: min assist to rise from surface with cues for hand placement    Ambulation/Gait Ambulation/Gait assistance: Contact guard assist Gait Distance (Feet): 50 Feet Assistive device: Rolling walker (2 wheels) Gait Pattern/deviations: Step-through pattern, Decreased stride length, Narrow base of support   Gait velocity interpretation: 1.31 - 2.62 ft/sec, indicative of limited community ambulator   General Gait Details: decreased stride and BOS with pt reliant on 2L O2 with SPO2 87-92% with cues for breathing technique and reliant on RW for support, limited by fatigue  Stairs            Wheelchair Mobility     Tilt Bed    Modified Rankin (Stroke Patients Only)       Balance Overall balance assessment: Needs assistance Sitting-balance support: No upper extremity supported, Feet supported Sitting balance-Leahy Scale: Fair     Standing balance support: Bilateral upper extremity supported, Reliant on assistive device for balance, During functional activity Standing balance-Leahy Scale: Poor Standing balance comment: RW in standing                             Pertinent Vitals/Pain Pain Assessment Pain Assessment: 0-10 Pain Score: 5  Pain Location: LBP Pain Descriptors / Indicators: Aching Pain Intervention(s): Limited activity within patient's tolerance, Monitored during session, Repositioned    Home Living Family/patient expects to be discharged to:: Private residence Living Arrangements: Spouse/significant other;Children Available Help  at Discharge: Family;Available PRN/intermittently Type of Home: House         Home Layout: One level Home Equipment: None      Prior Function Prior Level of Function : Needs assist             Mobility Comments: pt reports she is normally independent,  driving and moving but with back pain and progressive decline PTA without specific onset date she has been holding furniture and requiring assist ADLs Comments: daughter helping with dressing a few days PTA but normally independent     Extremity/Trunk Assessment   Upper Extremity Assessment Upper Extremity Assessment: Generalized weakness    Lower Extremity Assessment Lower Extremity Assessment: Generalized weakness    Cervical / Trunk Assessment Cervical / Trunk Assessment: Normal (guarding due to pain)  Communication   Communication Communication: No apparent difficulties    Cognition Arousal: Alert Behavior During Therapy: WFL for tasks assessed/performed   PT - Cognitive impairments: Orientation, Memory, Safety/Judgement   Orientation impairments: Time                   PT - Cognition Comments: pt not oriented to time, decreased recall of function and mobility PTA Following commands: Intact       Cueing Cueing Techniques: Verbal cues     General Comments      Exercises     Assessment/Plan    PT Assessment Patient needs continued PT services  PT Problem List Decreased strength;Decreased activity tolerance;Decreased balance;Decreased mobility;Decreased cognition;Decreased coordination;Decreased knowledge of use of DME;Decreased safety awareness;Cardiopulmonary status limiting activity       PT Treatment Interventions Gait training;DME instruction;Balance training;Stair training;Functional mobility training;Therapeutic activities;Therapeutic exercise;Patient/family education    PT Goals (Current goals can be found in the Care Plan section)  Acute Rehab PT Goals Patient Stated Goal: return home and care for myself PT Goal Formulation: With patient Time For Goal Achievement: 03/10/24 Potential to Achieve Goals: Good    Frequency Min 2X/week     Co-evaluation               AM-PAC PT 6 Clicks Mobility  Outcome Measure Help needed turning  from your back to your side while in a flat bed without using bedrails?: A Little Help needed moving from lying on your back to sitting on the side of a flat bed without using bedrails?: A Little Help needed moving to and from a bed to a chair (including a wheelchair)?: A Little Help needed standing up from a chair using your arms (e.g., wheelchair or bedside chair)?: A Little Help needed to walk in hospital room?: A Little Help needed climbing 3-5 steps with a railing? : A Lot 6 Click Score: 17    End of Session Equipment Utilized During Treatment: Oxygen Activity Tolerance: Patient tolerated treatment well Patient left: in chair;with call bell/phone within reach Nurse Communication: Mobility status PT Visit Diagnosis: Other abnormalities of gait and mobility (R26.89);Difficulty in walking, not elsewhere classified (R26.2)    Time: 8946-8880 PT Time Calculation (min) (ACUTE ONLY): 26 min   Charges:   PT Evaluation $PT Eval Moderate Complexity: 1 Mod PT Treatments $Gait Training: 8-22 mins PT General Charges $$ ACUTE PT VISIT: 1 Visit         Lenoard SQUIBB, PT Acute Rehabilitation Services Office: [phone]   Lenoard NOVAK Jequan Shahin 02/25/2024, 1:52 PM

## 2024-02-26 ENCOUNTER — Inpatient Hospital Stay (HOSPITAL_COMMUNITY)

## 2024-02-26 DIAGNOSIS — D7389 Other diseases of spleen: Secondary | ICD-10-CM | POA: Diagnosis not present

## 2024-02-26 DIAGNOSIS — J129 Viral pneumonia, unspecified: Secondary | ICD-10-CM | POA: Diagnosis not present

## 2024-02-26 DIAGNOSIS — B20 Human immunodeficiency virus [HIV] disease: Secondary | ICD-10-CM | POA: Diagnosis not present

## 2024-02-26 DIAGNOSIS — J189 Pneumonia, unspecified organism: Secondary | ICD-10-CM | POA: Diagnosis not present

## 2024-02-26 DIAGNOSIS — R29898 Other symptoms and signs involving the musculoskeletal system: Secondary | ICD-10-CM

## 2024-02-26 DIAGNOSIS — Z91199 Patient's noncompliance with other medical treatment and regimen due to unspecified reason: Secondary | ICD-10-CM

## 2024-02-26 LAB — CRYPTOCOCCAL ANTIGEN: Crypto Ag: NEGATIVE

## 2024-02-26 LAB — LACTATE DEHYDROGENASE: LDH: 279 U/L — ABNORMAL HIGH (ref 105–235)

## 2024-02-26 MED ORDER — GADOBUTROL 1 MMOL/ML IV SOLN
7.5000 mL | Freq: Once | INTRAVENOUS | Status: AC | PRN
  Administered 2024-02-26: 7.5 mL via INTRAVENOUS

## 2024-02-26 NOTE — Evaluation (Signed)
 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   Nonadherence to medical treatment 02/26/2024   AIDS (HCC) 02/25/2024   Hypoxia 02/25/2024   CAP (community acquired pneumonia) 02/24/2024   Allergic rhinitis due to allergen 06/16/2018   Obesity affecting pregnancy 04/01/2016   Asthma 07/08/2009   Human immunodeficiency virus (HIV) disease (HCC) 06/04/2009   Past Medical History:  Past Medical History:  Diagnosis Date   Asthma    Depression    Postpartum after 1st pregnancy   Hemorrhoids    HIV positive (HCC)    Past Surgical History:  Past Surgical History:  Procedure Laterality Date   DILATION AND CURETTAGE OF UTERUS     LAPAROSCOPY  01/06/2011   Procedure: LAPAROSCOPY OPERATIVE;  Surgeon: Olam DELENA Mill, MD;  Location: [facility];  Service: Gynecology;  Laterality: N/A;   LAPAROTOMY  01/06/2011   Procedure: LAPAROTOMY;  Surgeon: Olam DELENA Mill, MD;  Location: [facility];  Service: Gynecology;  Laterality: N/A;   SALPINGOOPHORECTOMY     HPI:  In with generalized body aches.  PMH:  asthma, HIV, depression and CAP.  She was diangosed with multifocal PNA.  CT of the chest was showing right greater than left lower lobe consolidatino with air bronchograms, suspicious for PNA.  Diffuse ground glass densities with a slight geographic distribution with occassional areas of superimposed tree in bud opacity and nodularity.   Assessment / Plan / Recommendation Clinical Impression  Cognitive/linguistic evaluation and motor speech screen were completed.   Speech was clear and easy to understand.  No discernible dysarthria or apraxia were noted.  She completed the Mini Mental State Exam achieving an overall score of 24/30.  She was fully  oriented to person, place and situation.  She was partially oriented to time stating they year, month, day of week and season correctly.  She did not know todays date.  She had good immediate recall of 3 novel words.  Given a short delay she was able to recall 2/3 words independently. Use of semantic cue facilitated recall of third novel word.  Mild issues noted for the attention task ie score 3/5.  Language skills appeared to be grossly intact.  She was able to name objects, repeat a short phrase, follow a 3 step command, read/comprehend a sentence and write a sentence.  Mild deficit noted for the design copying task.  Informally she was able to provide logical solutions to simple problems.  For the clock drawing task she was able to place the circle on the paper and place all numbers on the clock face although there were spacing issues.   She was able to place the hands on the clock at the directed time.  Given results of this evaluation ST follow up is not indicated.  If we can be of further assistance please feel free to reconsult ST.    SLP Assessment  SLP Recommendation/Assessment: Patient does not need any further Speech Language Pathology Services SLP Visit Diagnosis: Cognitive communication deficit (R41.841)     Assistance Recommended at Discharge  None  Functional Status Assessment Patient has not had a recent decline in their functional status        SLP Evaluation Cognition  Overall Cognitive Status: No family/caregiver present to determine baseline cognitive functioning Arousal/Alertness: Awake/alert Orientation  Level: Oriented to person;Oriented to place;Oriented to situation;Disoriented to time Attention: Sustained Sustained Attention: Impaired Sustained Attention Impairment: Verbal basic Memory: Impaired Memory Impairment: Decreased recall of new information Problem Solving: Appears intact       Comprehension  Auditory Comprehension Overall Auditory Comprehension: Appears  within functional limits for tasks assessed Commands: Within Functional Limits Conversation: Simple Reading Comprehension Reading Status: Within funtional limits    Expression Expression Primary Mode of Expression: Verbal Verbal Expression Overall Verbal Expression: Appears within functional limits for tasks assessed Initiation: No impairment Automatic Speech: Name;Social Response Level of Generative/Spontaneous Verbalization: Sentence Repetition: No impairment Naming: No impairment Pragmatics: No impairment Non-Verbal Means of Communication: Not applicable Written Expression Dominant Hand: Right Written Expression: Within Functional Limits   Oral / Motor  Motor Speech Overall Motor Speech: Appears within functional limits for tasks assessed           Eleanor Eagles, MA, CCC-SLP Acute Rehab SLP [phone]  Eleanor LOISE Eagles 02/26/2024, 12:36 PM

## 2024-02-26 NOTE — Progress Notes (Signed)
 Subjective:  She still feels fairly fatigued   Antibiotics:  Anti-infectives (From admission, onward)    Start     Dose/Rate Route Frequency Ordered Stop   02/24/24 1045  fluconazole  (DIFLUCAN ) tablet 150 mg        150 mg Oral  Once 02/24/24 1030 02/24/24 1118   02/23/24 2200  cefTRIAXone  (ROCEPHIN ) 1 g in sodium chloride  0.9 % 100 mL IVPB        1 g 200 mL/hr over 30 Minutes Intravenous Every 24 hours 02/23/24 1237 02/28/24 2159   02/23/24 1245  azithromycin  (ZITHROMAX ) tablet 500 mg        500 mg Oral Daily 02/23/24 1237 02/25/24 0908   02/23/24 1100  ceFEPIme  (MAXIPIME ) 2 g in sodium chloride  0.9 % 100 mL IVPB        2 g 200 mL/hr over 30 Minutes Intravenous  Once 02/23/24 1045 02/23/24 1214   02/23/24 1100  vancomycin  (VANCOREADY) IVPB 1250 mg/250 mL        1,250 mg 166.7 mL/hr over 90 Minutes Intravenous  Once 02/23/24 1045 02/23/24 1413       Medications: Scheduled Meds:  Chlorhexidine  Gluconate Cloth  6 each Topical Q0600   enoxaparin  (LOVENOX ) injection  40 mg Subcutaneous Q24H   hydrocortisone   1 Application Rectal BID   metroNIDAZOLE   1 Applicatorful Vaginal QHS   predniSONE   40 mg Oral Q breakfast   Continuous Infusions:  cefTRIAXone  (ROCEPHIN )  IV 200 mL/hr at 02/26/24 0745   PRN Meds:.acetaminophen , ipratropium-albuterol , ondansetron  (ZOFRAN ) IV, oxyCODONE     Objective: Weight change:   Intake/Output Summary (Last 24 hours) at 02/26/2024 1112 Last data filed at 02/26/2024 0745 Gross per 24 hour  Intake 2366 ml  Output 400 ml  Net 1966 ml   Blood pressure 118/85, pulse 81, temperature 98.2 F (36.8 C), resp. rate 19, height 5' 2 (1.575 m), weight 73 kg, last menstrual period 01/24/2024, SpO2 98%. Temp:  [97.5 F (36.4 C)-98.2 F (36.8 C)] 98.2 F (36.8 C) (12/06 0754) Pulse Rate:  [72-81] 81 (12/06 0754) Resp:  [16-19] 19 (12/06 0754) BP: (113-118)/(78-85) 118/85 (12/06 0754) SpO2:  [90 %-100 %] 98 % (12/06 0754)  Physical  Exam: Physical Exam Constitutional:      Appearance: She is underweight.  Cardiovascular:     Rate and Rhythm: Tachycardia present.     Heart sounds: No murmur heard.    No friction rub. No gallop.  Pulmonary:     Effort: No respiratory distress.     Breath sounds: Examination of the right-lower field reveals decreased breath sounds. Examination of the left-lower field reveals decreased breath sounds. Decreased breath sounds present. No wheezing.  Abdominal:     General: There is no distension.  Neurological:     General: No focal deficit present.     Mental Status: She is alert and oriented to person, place, and time.  Psychiatric:        Attention and Perception: Attention and perception normal.        Mood and Affect: Mood is depressed.        Speech: Speech normal.        Behavior: Behavior is uncooperative.        Thought Content: Thought content normal.        Cognition and Memory: Cognition and memory normal.      CBC:    BMET Recent Labs    02/25/24 0531  NA 138  K 4.1  CL 101  CO2 27  GLUCOSE 136*  BUN 8  CREATININE 0.47  CALCIUM 8.3*     Liver Panel  Recent Labs    02/25/24 0531  PROT 6.7  ALBUMIN 1.6*  AST 17  ALT 8  ALKPHOS 72  BILITOT <0.2       Sedimentation Rate No results for input(s): ESRSEDRATE in the last 72 hours. C-Reactive Protein No results for input(s): CRP in the last 72 hours.  Micro Results: Recent Results (from the past 720 hours)  Urine Culture     Status: Abnormal   Collection Time: 02/20/24  5:57 PM   Specimen: Urine, Clean Catch  Result Value Ref Range Status   Specimen Description URINE, CLEAN CATCH  Final   Special Requests   Final    NONE Performed at [facility], [address] [place], [place] [address]    Culture MULTIPLE SPECIES PRESENT, SUGGEST RECOLLECTION (A)  Final   Report Status 02/21/2024 FINAL  Final  Culture, blood (routine x 2)     Status: None (Preliminary result)   Collection  Time: 02/23/24 10:16 AM   Specimen: BLOOD  Result Value Ref Range Status   Specimen Description BLOOD RIGHT ANTECUBITAL  Final   Special Requests   Final    BOTTLES DRAWN AEROBIC AND ANAEROBIC Blood Culture adequate volume   Culture   Final    NO GROWTH 3 DAYS Performed at [facility], [address] [place], [place] [address]    Report Status PENDING  Incomplete  Resp panel by RT-PCR (RSV, Flu A&B, Covid) Anterior Nasal Swab     Status: None   Collection Time: 02/23/24 10:17 AM   Specimen: Anterior Nasal Swab  Result Value Ref Range Status   SARS Coronavirus 2 by RT PCR NEGATIVE NEGATIVE Final   Influenza A by PCR NEGATIVE NEGATIVE Final   Influenza B by PCR NEGATIVE NEGATIVE Final    Comment: (NOTE) The Xpert Xpress SARS-CoV-2/FLU/RSV plus assay is intended as an aid in the diagnosis of influenza from Nasopharyngeal swab specimens and should not be used as a sole basis for treatment. Nasal washings and aspirates are unacceptable for Xpert Xpress SARS-CoV-2/FLU/RSV testing.  Fact Sheet for Patients: bloggercourse.com  Fact Sheet for Healthcare Providers: seriousbroker.it  This test is not yet approved or cleared by the [place]  FDA and has been authorized for detection and/or diagnosis of SARS-CoV-2 by FDA under an Emergency Use Authorization (EUA). This EUA will remain in effect (meaning this test can be used) for the duration of the COVID-19 declaration under Section 564(b)(1) of the Act, 21 U.S.C. section 360bbb-3(b)(1), unless the authorization is terminated or revoked.     Resp Syncytial Virus by PCR NEGATIVE NEGATIVE Final    Comment: (NOTE) Fact Sheet for Patients: bloggercourse.com  Fact Sheet for Healthcare Providers: seriousbroker.it  This test is not yet approved or cleared by the [place]  FDA and has been authorized for detection and/or  diagnosis of SARS-CoV-2 by FDA under an Emergency Use Authorization (EUA). This EUA will remain in effect (meaning this test can be used) for the duration of the COVID-19 declaration under Section 564(b)(1) of the Act, 21 U.S.C. section 360bbb-3(b)(1), unless the authorization is terminated or revoked.  Performed at [facility], [address], [place], [place] [address]   Respiratory (~20 pathogens) panel by PCR     Status: Abnormal   Collection Time: 02/23/24  9:31 PM   Specimen: Nasopharyngeal Swab; Respiratory  Result Value Ref Range Status   Adenovirus NOT DETECTED NOT DETECTED Final   Coronavirus 229E NOT DETECTED NOT DETECTED Final    Comment: (NOTE) The Coronavirus on the Respiratory Panel, DOES NOT test for the novel  Coronavirus (2019 nCoV)    Coronavirus HKU1 NOT DETECTED NOT DETECTED Final   Coronavirus NL63 NOT DETECTED NOT DETECTED Final   Coronavirus OC43 NOT DETECTED NOT DETECTED Final   Metapneumovirus NOT DETECTED NOT DETECTED Final   Rhinovirus / Enterovirus DETECTED (A) NOT DETECTED Final   Influenza A NOT DETECTED NOT DETECTED Final   Influenza B NOT DETECTED NOT DETECTED Final   Parainfluenza Virus 1 NOT DETECTED NOT DETECTED Final   Parainfluenza Virus 2 NOT DETECTED NOT DETECTED Final   Parainfluenza Virus 3 NOT DETECTED NOT DETECTED Final   Parainfluenza Virus 4 NOT DETECTED NOT DETECTED Final   Respiratory Syncytial Virus NOT DETECTED NOT DETECTED Final   Bordetella pertussis NOT DETECTED NOT DETECTED Final   Bordetella Parapertussis NOT DETECTED NOT DETECTED Final   Chlamydophila pneumoniae NOT DETECTED NOT DETECTED Final   Mycoplasma pneumoniae NOT DETECTED NOT DETECTED Final    Comment: Performed at [facility], [address], [place], [place] [address]  Culture, blood (routine x 2)     Status: None (Preliminary result)   Collection Time: 02/23/24 10:09 PM   Specimen: BLOOD  Result Value Ref Range Status   Specimen Description BLOOD  RIGHT ANTECUBITAL  Final   Special Requests   Final    BOTTLES DRAWN AEROBIC AND ANAEROBIC Blood Culture adequate volume   Culture   Final    NO GROWTH 3 DAYS Performed at [facility], [address] [place], [place] [address]    Report Status PENDING  Incomplete    Studies/Results: CT CHEST ABDOMEN PELVIS W CONTRAST Result Date: 02/25/2024 CLINICAL DATA:  Groin lymphadenopathy. EXAM: CT CHEST, ABDOMEN, AND PELVIS WITH CONTRAST TECHNIQUE: Multidetector CT imaging of the chest, abdomen and pelvis was performed following the standard protocol during bolus administration of intravenous contrast. RADIATION DOSE REDUCTION: This exam was performed according to the departmental dose-optimization program which includes automated exposure control, adjustment of the mA and/or kV according to patient size and/or use of iterative reconstruction technique. CONTRAST:  75mL OMNIPAQUE  IOHEXOL  350 MG/ML SOLN COMPARISON:  Abdominopelvic CT 06/17/2023, chest radiograph 02/23/2024 FINDINGS: CT CHEST FINDINGS Cardiovascular: The heart is normal in size. Trace pericardial effusion. No obvious central pulmonary embolus, exam not tailored to pulmonary artery assessment. The thoracic aorta is normal in caliber. Mediastinum/Nodes: No enlarged mediastinal or hilar lymph nodes. No axillary adenopathy. Patulous esophagus without wall thickening. No visible thyroid nodule. Lungs/Pleura: Right greater than left lower lobe consolidation with air bronchograms. Diffuse ground-glass densities with a slightly geographic distribution. Occasional areas of superimposed tree-in-bud opacity and nodularity. Trace pleural effusions. Musculoskeletal: There are no acute or suspicious osseous abnormalities. CT ABDOMEN PELVIS FINDINGS Hepatobiliary: Stable scattered cysts throughout both lobes of the liver. Focal fatty infiltration adjacent to the falciform ligament. No suspicious liver lesion decompressed gallbladder. Pancreas: No ductal  dilatation or inflammation. Spleen: Multiple hypodense lesions again seen, slightly increased. Largest lesion measures 4.4 cm, series 5, image 45, previously 4 cm. No splenomegaly. Adrenals/Urinary Tract: The adrenal glands are normal. No hydronephrosis. No renal stones or focal renal lesion. No evidence of renal inflammation. Urinary bladder is decompressed by Foley catheter. Stomach/Bowel: Bowel assessment is limited in the absence of enteric contrast. The stomach is nondistended. No small bowel obstruction or inflammatory  change. Fluid/liquid stool throughout the colon. There is no colonic wall thickening or pericolonic edema. The sigmoid colon is redundant. The appendix is normal. Vascular/Lymphatic: Normal caliber abdominal aorta. Portal and splenic veins are patent. Included inguinal lymph nodes are not enlarged by size criteria and demonstrate normal fatty hila. The largest lymph node is in the left inguinal region measuring 8 mm short axis. No suspicious lymphadenopathy in the abdomen or pelvis. Reproductive: Prominent left periuterine and adnexal vascularity with dilatation of the ovarian vein. The uterus is anteverted. Small amount of fluid in the vagina. No adnexal mass. Other: Trace free fluid in the pelvis. No focal fluid collection. Tiny fat containing umbilical hernia. No ascites. Musculoskeletal: There are no acute or suspicious osseous abnormalities. IMPRESSION: 1. No suspicious lymphadenopathy in the chest, abdomen, or pelvis. 2. Right greater than left lower lobe consolidation with air bronchograms, suspicious for pneumonia. Diffuse ground-glass densities with a slightly geographic distribution, with occasional areas of superimposed tree-in-bud opacity and nodularity. Findings are suspicious for typically infectious. 3. Multiple hypodense lesions in the spleen, slightly increased in size from prior exam. These are nonspecific. Splenic lesions are typically benign, although the possibility of  superimposed infection is considered. 4. Fluid/liquid stool throughout the colon, can be seen with diarrheal illness. 5. Prominent left periuterine and adnexal vascularity with dilatation of the ovarian vein, can be seen with pelvic congestion syndrome in the appropriate clinical setting. Electronically Signed   By: Andrea Gasman M.D.   On: 02/25/2024 15:44      Assessment/Plan:  INTERVAL HISTORY: CT chest abdomen pelvis unremarkable CT chest abdomen pelvis shows right greater than left lower lobe consolidation with air bronchogram gram suspicious for pneumonia with diffuse ground glass densities in geographic distribution and occasional areas of superimposed tree-in-bud opacities and multiple hypodense lesions in the spleen as well as fluid liquid fluid throughout the distal colon and prominent left periuterine adnexal vascularity   Principal Problem:   CAP (community acquired pneumonia) Active Problems:   AIDS (HCC)   Hypoxia    [name] is a 38 y.o. female with advanced HIV AIDS and CD4 count that was not detectable when last checked admitted for pneumonia with rhinovirus enterovirus isolated on 20 pathogen respiratory panel currently also on antibacterial antibiotics in the form of ceftriaxone  and azithromycin .  Fever has defervesced but she still does not feel that great her LDH was normal. We certain wely certainly about PCP though given the fact she seems to be getting better and that she does have a viral pathogen to blame her pneumonia on I am not going to initiate PCP treatment at this point in time  She has some nodules that were seen on imaging I will send a serum cryptococcal antigen urine histoplasma and Blastomyces antigens.  #1 Pneumonia: I would favor this being a viral or viral mixed with bacterial infection rather than PCP at this point given that she seems to be improving  I will also send those serum cryptococcal antigen and urine histo and Blastomyces  antigens  #2 splenic lesion is not clear what has caused these I will get a 2D echocardiogram in case she has endocarditis that is causing it but her blood cultures are certainly negative  #3 Malaise and back pain  MRI back pending   #4 HIV/AIDS  She was initially under the care of Dr. Janna and on a boosted protease inhibitor which I believe at that time was Kaletra with Combivir later on REYATAZ  NORVIR  Truvada  then  seen briefly by my former partner Dr. Efrain and rx Tivicay  and Descovy.  During that period she for what ever reason stopped taking the DESCOVY and took the TIVICAY  alone and so there is been anxiety about integrase resistance.  She then establish care in [facility] where she was being treated with twice daily darunavir  ritonavir  with Epzicom  though they had wished to start her on integrates inhibitor when they had checked integrase genotype the assay failed.  When asked her why she has difficulty taking her medications she says that they keep giving me all of these big medicines.  I would ideally like to place her on something like BIKTARVY given her history of taking TIVICAY  alone I would not trust her to be on a multi tablet regimen that was not based on a protease inhibitor.  We could also certainly use SYMTUZA  and crush it.  We could also look at using Delstrigo which is not a terribly big pill although it does not have a high barrier to resistance unlike the boosted protease and integrase  I will ave further discussions with her tomorrow  I personally spent a total of 51 minutes in the care of the patient today including preparing to see the patient, getting/reviewing separately obtained history, performing a medically appropriate exam/evaluation, counseling and educating, placing orders, referring and communicating with other health care professionals, documenting clinical information in the EHR, and independently interpreting results.   Evaluation of the patient  requires complex antimicrobial therapy evaluation, counseling , isolation needs to reduce disease transmission and risk assessment and mitigation.      LOS: 2 days   Jomarie Fleeta Rothman 02/26/2024, 11:12 AM

## 2024-02-26 NOTE — Progress Notes (Signed)
 PROGRESS NOTE  [name] FMW:[id number] DOB: [birth year] DOA: 02/23/2024 PCP: Mary Mliss FALCON, FNP   LOS: 2 days   Brief Narrative / Interim history: 38 year old patient with HIV, asthma, depression, recently diagnosed pneumonia and on antibiotics comes into the hospital with persistent fever, chills, fatigue and generalized weakness.  She tells me that when EMS arrived at her home she was too weak that she could not ambulate.  X-ray on admission showed multifocal pneumonia, she was placed on antibiotics and admitted to the hospital.  RVP was positive for rhinovirus.  ID was consulted in the setting of underlying HIV as she was not taking her ART  Patient does not want her diagnoses and medical care disclosed to any family members  Subjective / 24h Interval events: She is feeling a little bit better this morning, feels like she is get more strength in her legs.  Continues complaint of discomfort in the pelvic area, thinks it is related to the Foley catheter, although the discomfort did precede Foley catheter placement  Assesement and Plan: Principal problem Sepsis due to multifocal pneumonia, rhinovirus infection -her pneumonia is probably due to rhinovirus infection, however she is at risk for multiple other pathogens given very low CD4 count. - ID consulted, appreciate input.  For now continue broad-spectrum antibiotics - Strep pneumo urinary antigen is negative.  Fungitell beta D glucan is pending.  Respiratory cultures, pneumocystis still pending  Active problems HIV-has not been taking her ART, CD4, undetectably low, viral load 17,000.  Management per ID  Acute hypoxic respiratory failure, asthma exacerbation-patient was hypoxemic on arrival requiring 2 L supplemental oxygen, also tachypneic with respiratory rate up to mid 30s.  Felt like she was wheezing on admission, and was started on steroids.  Wheezing now resolved, wean off steroids, wean off to room air if tolerates - CT  scan of the chest, abdomen and pelvis confirms pneumonia, no other acute findings  Lower extremity weakness -bilateral, also with inability to urinate and urinary retention.  MR of the lumbar spine still pending this morning  Presumed bacterial vaginosis, vulvovaginal candidiasis-on vaginal metronidazole , received fluconazole  x 1  Scheduled Meds:  Chlorhexidine  Gluconate Cloth  6 each Topical Q0600   enoxaparin  (LOVENOX ) injection  40 mg Subcutaneous Q24H   hydrocortisone   1 Application Rectal BID   metroNIDAZOLE   1 Applicatorful Vaginal QHS   predniSONE   40 mg Oral Q breakfast   Continuous Infusions:  cefTRIAXone  (ROCEPHIN )  IV 200 mL/hr at 02/26/24 0745   PRN Meds:.acetaminophen , ipratropium-albuterol , ondansetron  (ZOFRAN ) IV, oxyCODONE   Current Outpatient Medications  Medication Instructions   albuterol  (VENTOLIN  HFA) 108 (90 Base) MCG/ACT inhaler 2 puffs, Inhalation, Every 6 hours PRN   cefixime  (SUPRAX ) 400 mg, Oral, Daily   fluconazole  (DIFLUCAN ) 150 MG tablet Take 1 tablet today.  Take second tablet 3 days later.   metroNIDAZOLE  (METROGEL ) 0.75 % vaginal gel Insert vaginally using applicator nightly for 5 nights.  Abstain from sexual intercourse or tampon use until treatment is complete.   Multiple Vitamins-Minerals (WOMENS MULTIVITAMIN) TABS 1 tablet, Daily   ondansetron  (ZOFRAN -ODT) 4 mg, Oral, Every 8 hours PRN    Diet Orders (From admission, onward)     Start     Ordered   02/24/24 1809  Diet regular Room service appropriate? Yes; Fluid consistency: Thin  Diet effective now       Question Answer Comment  Room service appropriate? Yes   Fluid consistency: Thin      02/24/24 1809  DVT prophylaxis: enoxaparin  (LOVENOX ) injection 40 mg Start: 02/24/24 1045   Lab Results  Component Value Date   PLT 416 (H) 02/25/2024      Code Status: Full Code  Family Communication: no family at bedside   Status is: Inpatient Remains inpatient appropriate  because: Severity of illness, lower extremity weakness, IV antibiotics   Level of care: Telemetry  Consultants:  ID  Objective: Vitals:   02/25/24 1334 02/25/24 1948 02/26/24 0441 02/26/24 0754  BP:  113/78 116/83 118/85  Pulse:  81 72 81  Resp:  16 19 19   Temp:  (!) 97.5 F (36.4 C) 97.6 F (36.4 C) 98.2 F (36.8 C)  TempSrc:  Oral    SpO2: 90% 100% 98% 98%  Weight:      Height:        Intake/Output Summary (Last 24 hours) at 02/26/2024 0945 Last data filed at 02/26/2024 0745 Gross per 24 hour  Intake 2366 ml  Output 400 ml  Net 1966 ml   Wt Readings from Last 3 Encounters:  02/24/24 73 kg  06/17/23 73.9 kg  11/18/22 76 kg    Examination:  Constitutional: NAD Eyes: lids and conjunctivae normal, no scleral icterus ENMT: mmm Neck: normal, supple Respiratory: clear to auscultation bilaterally, no wheezing, no crackles. Normal respiratory effort.  Cardiovascular: Regular rate and rhythm, no murmurs / rubs / gallops. No LE edema. Abdomen: soft, no distention, no tenderness. Bowel sounds positive.   Data Reviewed: I have independently reviewed following labs and imaging studies   CBC Recent Labs  Lab 02/20/24 1918 02/23/24 1035 02/23/24 1055 02/25/24 0531  WBC 6.3 8.6  --  3.7*  HGB 10.3* 9.5* 10.2*  10.5* 9.1*  HCT 33.1* 30.8* 30.0*  31.0* 29.1*  PLT 422* 373  --  416*  MCV 85.5 84.6  --  83.4  MCH 26.6 26.1  --  26.1  MCHC 31.1 30.8  --  31.3  RDW 13.7 13.9  --  14.0  LYMPHSABS 0.6* 0.6*  --   --   MONOABS 0.4 0.6  --   --   EOSABS 0.1 0.0  --   --   BASOSABS 0.0 0.0  --   --     Recent Labs  Lab 02/20/24 1918 02/23/24 1035 02/23/24 1055 02/23/24 1056 02/25/24 0531  NA 137 137 135  135  --  138  K 3.5 3.4* 4.7  4.6  --  4.1  CL 102 97* 96*  --  101  CO2 23 28  --   --  27  GLUCOSE 114* 98 97  --  136*  BUN 8 5* 6  --  8  CREATININE 0.68 0.82 0.80  --  0.47  CALCIUM 8.2* 8.4*  --   --  8.3*  AST 23 23  --   --  17  ALT 7 8  --   --   8  ALKPHOS 91 76  --   --  72  BILITOT 0.4 0.6  --   --  <0.2  ALBUMIN 2.1* 1.8*  --   --  1.6*  MG  --   --   --   --  2.0  LATICACIDVEN  --   --   --  1.3  --     ------------------------------------------------------------------------------------------------------------------ No results for input(s): CHOL, HDL, LDLCALC, TRIG, CHOLHDL, LDLDIRECT in the last 72 hours.  Lab Results  Component Value Date   HGBA1C 5.6 04/08/2022   ------------------------------------------------------------------------------------------------------------------ No results for  input(s): TSH, T4TOTAL, T3FREE, THYROIDAB in the last 72 hours.  Invalid input(s): FREET3  Cardiac Enzymes No results for input(s): CKMB, TROPONINI, MYOGLOBIN in the last 168 hours.  Invalid input(s): CK ------------------------------------------------------------------------------------------------------------------ No results found for: BNP  CBG: No results for input(s): GLUCAP in the last 168 hours.  Recent Results (from the past 240 hours)  Urine Culture     Status: Abnormal   Collection Time: 02/20/24  5:57 PM   Specimen: Urine, Clean Catch  Result Value Ref Range Status   Specimen Description URINE, CLEAN CATCH  Final   Special Requests   Final    NONE Performed at [facility], [address] [place], [place] [address]    Culture MULTIPLE SPECIES PRESENT, SUGGEST RECOLLECTION (A)  Final   Report Status 02/21/2024 FINAL  Final  Culture, blood (routine x 2)     Status: None (Preliminary result)   Collection Time: 02/23/24 10:16 AM   Specimen: BLOOD  Result Value Ref Range Status   Specimen Description BLOOD RIGHT ANTECUBITAL  Final   Special Requests   Final    BOTTLES DRAWN AEROBIC AND ANAEROBIC Blood Culture adequate volume   Culture   Final    NO GROWTH 2 DAYS Performed at [facility], [address] [place], [place] [address]    Report Status PENDING   Incomplete  Resp panel by RT-PCR (RSV, Flu A&B, Covid) Anterior Nasal Swab     Status: None   Collection Time: 02/23/24 10:17 AM   Specimen: Anterior Nasal Swab  Result Value Ref Range Status   SARS Coronavirus 2 by RT PCR NEGATIVE NEGATIVE Final   Influenza A by PCR NEGATIVE NEGATIVE Final   Influenza B by PCR NEGATIVE NEGATIVE Final    Comment: (NOTE) The Xpert Xpress SARS-CoV-2/FLU/RSV plus assay is intended as an aid in the diagnosis of influenza from Nasopharyngeal swab specimens and should not be used as a sole basis for treatment. Nasal washings and aspirates are unacceptable for Xpert Xpress SARS-CoV-2/FLU/RSV testing.  Fact Sheet for Patients: bloggercourse.com  Fact Sheet for Healthcare Providers: seriousbroker.it  This test is not yet approved or cleared by the [place]  FDA and has been authorized for detection and/or diagnosis of SARS-CoV-2 by FDA under an Emergency Use Authorization (EUA). This EUA will remain in effect (meaning this test can be used) for the duration of the COVID-19 declaration under Section 564(b)(1) of the Act, 21 U.S.C. section 360bbb-3(b)(1), unless the authorization is terminated or revoked.     Resp Syncytial Virus by PCR NEGATIVE NEGATIVE Final    Comment: (NOTE) Fact Sheet for Patients: bloggercourse.com  Fact Sheet for Healthcare Providers: seriousbroker.it  This test is not yet approved or cleared by the [place]  FDA and has been authorized for detection and/or diagnosis of SARS-CoV-2 by FDA under an Emergency Use Authorization (EUA). This EUA will remain in effect (meaning this test can be used) for the duration of the COVID-19 declaration under Section 564(b)(1) of the Act, 21 U.S.C. section 360bbb-3(b)(1), unless the authorization is terminated or revoked.  Performed at [facility], [address]  [place], [place] [address]   Respiratory (~20 pathogens) panel by PCR     Status: Abnormal   Collection Time: 02/23/24  9:31 PM   Specimen: Nasopharyngeal Swab; Respiratory  Result Value Ref Range Status   Adenovirus NOT DETECTED NOT DETECTED Final   Coronavirus 229E NOT DETECTED NOT DETECTED Final    Comment: (NOTE) The Coronavirus on  the Respiratory Panel, DOES NOT test for the novel  Coronavirus (2019 nCoV)    Coronavirus HKU1 NOT DETECTED NOT DETECTED Final   Coronavirus NL63 NOT DETECTED NOT DETECTED Final   Coronavirus OC43 NOT DETECTED NOT DETECTED Final   Metapneumovirus NOT DETECTED NOT DETECTED Final   Rhinovirus / Enterovirus DETECTED (A) NOT DETECTED Final   Influenza A NOT DETECTED NOT DETECTED Final   Influenza B NOT DETECTED NOT DETECTED Final   Parainfluenza Virus 1 NOT DETECTED NOT DETECTED Final   Parainfluenza Virus 2 NOT DETECTED NOT DETECTED Final   Parainfluenza Virus 3 NOT DETECTED NOT DETECTED Final   Parainfluenza Virus 4 NOT DETECTED NOT DETECTED Final   Respiratory Syncytial Virus NOT DETECTED NOT DETECTED Final   Bordetella pertussis NOT DETECTED NOT DETECTED Final   Bordetella Parapertussis NOT DETECTED NOT DETECTED Final   Chlamydophila pneumoniae NOT DETECTED NOT DETECTED Final   Mycoplasma pneumoniae NOT DETECTED NOT DETECTED Final    Comment: Performed at [facility], [address], [place], [place] [address]  Culture, blood (routine x 2)     Status: None (Preliminary result)   Collection Time: 02/23/24 10:09 PM   Specimen: BLOOD  Result Value Ref Range Status   Specimen Description BLOOD RIGHT ANTECUBITAL  Final   Special Requests   Final    BOTTLES DRAWN AEROBIC AND ANAEROBIC Blood Culture adequate volume   Culture   Final    NO GROWTH 2 DAYS Performed at [facility], [address], [place], [place] [address]    Report Status PENDING  Incomplete     Radiology Studies: CT CHEST ABDOMEN PELVIS W CONTRAST Result Date:  02/25/2024 CLINICAL DATA:  Groin lymphadenopathy. EXAM: CT CHEST, ABDOMEN, AND PELVIS WITH CONTRAST TECHNIQUE: Multidetector CT imaging of the chest, abdomen and pelvis was performed following the standard protocol during bolus administration of intravenous contrast. RADIATION DOSE REDUCTION: This exam was performed according to the departmental dose-optimization program which includes automated exposure control, adjustment of the mA and/or kV according to patient size and/or use of iterative reconstruction technique. CONTRAST:  75mL OMNIPAQUE  IOHEXOL  350 MG/ML SOLN COMPARISON:  Abdominopelvic CT 06/17/2023, chest radiograph 02/23/2024 FINDINGS: CT CHEST FINDINGS Cardiovascular: The heart is normal in size. Trace pericardial effusion. No obvious central pulmonary embolus, exam not tailored to pulmonary artery assessment. The thoracic aorta is normal in caliber. Mediastinum/Nodes: No enlarged mediastinal or hilar lymph nodes. No axillary adenopathy. Patulous esophagus without wall thickening. No visible thyroid nodule. Lungs/Pleura: Right greater than left lower lobe consolidation with air bronchograms. Diffuse ground-glass densities with a slightly geographic distribution. Occasional areas of superimposed tree-in-bud opacity and nodularity. Trace pleural effusions. Musculoskeletal: There are no acute or suspicious osseous abnormalities. CT ABDOMEN PELVIS FINDINGS Hepatobiliary: Stable scattered cysts throughout both lobes of the liver. Focal fatty infiltration adjacent to the falciform ligament. No suspicious liver lesion decompressed gallbladder. Pancreas: No ductal dilatation or inflammation. Spleen: Multiple hypodense lesions again seen, slightly increased. Largest lesion measures 4.4 cm, series 5, image 45, previously 4 cm. No splenomegaly. Adrenals/Urinary Tract: The adrenal glands are normal. No hydronephrosis. No renal stones or focal renal lesion. No evidence of renal inflammation. Urinary bladder is  decompressed by Foley catheter. Stomach/Bowel: Bowel assessment is limited in the absence of enteric contrast. The stomach is nondistended. No small bowel obstruction or inflammatory change. Fluid/liquid stool throughout the colon. There is no colonic wall thickening or pericolonic edema. The sigmoid colon is redundant. The appendix is normal. Vascular/Lymphatic: Normal caliber abdominal  aorta. Portal and splenic veins are patent. Included inguinal lymph nodes are not enlarged by size criteria and demonstrate normal fatty hila. The largest lymph node is in the left inguinal region measuring 8 mm short axis. No suspicious lymphadenopathy in the abdomen or pelvis. Reproductive: Prominent left periuterine and adnexal vascularity with dilatation of the ovarian vein. The uterus is anteverted. Small amount of fluid in the vagina. No adnexal mass. Other: Trace free fluid in the pelvis. No focal fluid collection. Tiny fat containing umbilical hernia. No ascites. Musculoskeletal: There are no acute or suspicious osseous abnormalities. IMPRESSION: 1. No suspicious lymphadenopathy in the chest, abdomen, or pelvis. 2. Right greater than left lower lobe consolidation with air bronchograms, suspicious for pneumonia. Diffuse ground-glass densities with a slightly geographic distribution, with occasional areas of superimposed tree-in-bud opacity and nodularity. Findings are suspicious for typically infectious. 3. Multiple hypodense lesions in the spleen, slightly increased in size from prior exam. These are nonspecific. Splenic lesions are typically benign, although the possibility of superimposed infection is considered. 4. Fluid/liquid stool throughout the colon, can be seen with diarrheal illness. 5. Prominent left periuterine and adnexal vascularity with dilatation of the ovarian vein, can be seen with pelvic congestion syndrome in the appropriate clinical setting. Electronically Signed   By: Andrea Gasman M.D.   On:  02/25/2024 15:44     Nilda Fendt, MD, PhD [facility]  Between 7 am - 7 pm I am available, please contact me via Amion (for emergencies) or Securechat (non urgent messages)  Between 7 pm - 7 am I am not available, please contact night coverage MD/APP via Amion

## 2024-02-27 ENCOUNTER — Inpatient Hospital Stay (HOSPITAL_COMMUNITY)

## 2024-02-27 DIAGNOSIS — R29898 Other symptoms and signs involving the musculoskeletal system: Secondary | ICD-10-CM | POA: Diagnosis not present

## 2024-02-27 DIAGNOSIS — J189 Pneumonia, unspecified organism: Secondary | ICD-10-CM | POA: Diagnosis not present

## 2024-02-27 DIAGNOSIS — J129 Viral pneumonia, unspecified: Secondary | ICD-10-CM | POA: Diagnosis not present

## 2024-02-27 DIAGNOSIS — B20 Human immunodeficiency virus [HIV] disease: Secondary | ICD-10-CM | POA: Diagnosis not present

## 2024-02-27 DIAGNOSIS — D7389 Other diseases of spleen: Secondary | ICD-10-CM | POA: Diagnosis not present

## 2024-02-27 DIAGNOSIS — R0600 Dyspnea, unspecified: Secondary | ICD-10-CM

## 2024-02-27 LAB — ECHOCARDIOGRAM COMPLETE
AR max vel: 2.35 cm2
AV Peak grad: 6.1 mmHg
Ao pk vel: 1.23 m/s
Area-P 1/2: 4.89 cm2
Est EF: >75
Height: 62 in
S' Lateral: 2.3 cm
Weight: 2576 [oz_av]

## 2024-02-27 LAB — BASIC METABOLIC PANEL WITH GFR
Anion gap: 9 (ref 5–15)
BUN: 9 mg/dL (ref 6–20)
CO2: 27 mmol/L (ref 22–32)
Calcium: 8.6 mg/dL — ABNORMAL LOW (ref 8.9–10.3)
Chloride: 103 mmol/L (ref 98–111)
Creatinine, Ser: 0.51 mg/dL (ref 0.44–1.00)
GFR, Estimated: >60 mL/min (ref >60)
Glucose, Bld: 76 mg/dL (ref 70–99)
Potassium: 3.2 mmol/L — ABNORMAL LOW (ref 3.5–5.1)
Sodium: 139 mmol/L (ref 135–145)

## 2024-02-27 LAB — CBC
HCT: 31.5 % — ABNORMAL LOW (ref 36.0–46.0)
Hemoglobin: 9.8 g/dL — ABNORMAL LOW (ref 12.0–15.0)
MCH: 26.2 pg (ref 26.0–34.0)
MCHC: 31.1 g/dL (ref 30.0–36.0)
MCV: 84.2 fL (ref 80.0–100.0)
Platelets: 455 K/uL — ABNORMAL HIGH (ref 150–400)
RBC: 3.74 MIL/uL — ABNORMAL LOW (ref 3.87–5.11)
RDW: 13.9 % (ref 11.5–15.5)
WBC: 8.4 K/uL (ref 4.0–10.5)
nRBC: 0 % (ref 0.0–0.2)

## 2024-02-27 LAB — LEGIONELLA PNEUMOPHILA SEROGP 1 UR AG: L. pneumophila Serogp 1 Ur Ag: NEGATIVE

## 2024-02-27 LAB — MAGNESIUM: Magnesium: 1.9 mg/dL (ref 1.7–2.4)

## 2024-02-27 MED ORDER — GADOBUTROL 1 MMOL/ML IV SOLN
7.5000 mL | Freq: Once | INTRAVENOUS | Status: AC | PRN
  Administered 2024-02-27: 7.5 mL via INTRAVENOUS

## 2024-02-27 MED ORDER — DARUN-COBIC-EMTRICIT-TENOFAF 800-150-200-10 MG PO TABS
1.0000 | ORAL_TABLET | Freq: Every day | ORAL | Status: DC
  Administered 2024-02-28 – 2024-02-29 (×2): 1 via ORAL
  Filled 2024-02-27 (×2): qty 1

## 2024-02-27 NOTE — Progress Notes (Signed)
 PROGRESS NOTE  [name] FMW:[id number] DOB: [birth year] DOA: 02/23/2024 PCP: Gareth Mliss FALCON, FNP   LOS: 3 days   Brief Narrative / Interim history: 38 year old patient with HIV, asthma, depression, recently diagnosed pneumonia and on antibiotics comes into the hospital with persistent fever, chills, fatigue and generalized weakness.  She tells me that when EMS arrived at her home she was too weak that she could not ambulate.  X-ray on admission showed multifocal pneumonia, she was placed on antibiotics and admitted to the hospital.  RVP was positive for rhinovirus.  ID was consulted in the setting of underlying HIV as she was not taking her ART  Patient does not want her diagnoses and medical care disclosed to any family members  Subjective / 24h Interval events: Feels a little bit better, stronger.  Still has not been moving much.  She denies any chest pain, no abdominal discomfort, no nausea or vomiting.  Assesement and Plan: Principal problem Sepsis due to multifocal pneumonia, rhinovirus infection -her pneumonia is probably due to rhinovirus infection, however she is at risk for multiple other pathogens given very low CD4 count. - ID consulted, appreciate input.  For now continue antibiotics as below.  ART has not been started yet - Strep pneumo urinary antigen is negative.  Fungitell beta D glucan is pending.  Respiratory cultures, pneumocystis, Legionella still pending.  Cryptococcal antigen is negative  Active problems HIV-has not been taking her ART, CD4, undetectably low, viral load 17,000.  Management per ID  Acute hypoxic respiratory failure, asthma exacerbation-patient was hypoxemic on arrival requiring 2 L supplemental oxygen, also tachypneic with respiratory rate up to mid 30s.  Felt like she was wheezing on admission, and was started on steroids.  Wheezing now resolved, wean off steroids, wean off to room air if tolerates - CT scan of the chest, abdomen and pelvis  confirms pneumonia, no other acute findings  Lower extremity weakness -bilateral, also with inability to urinate and urinary retention.  MR of the lumbar spine unremarkable.  Mobilize more, remove Foley  Presumed bacterial vaginosis, vulvovaginal candidiasis-on vaginal metronidazole , received fluconazole  x 1  Scheduled Meds:  Chlorhexidine  Gluconate Cloth  6 each Topical Q0600   enoxaparin  (LOVENOX ) injection  40 mg Subcutaneous Q24H   hydrocortisone   1 Application Rectal BID   metroNIDAZOLE   1 Applicatorful Vaginal QHS   predniSONE   40 mg Oral Q breakfast   Continuous Infusions:  cefTRIAXone  (ROCEPHIN )  IV 1 g (02/26/24 2116)   PRN Meds:.acetaminophen , ipratropium-albuterol , ondansetron  (ZOFRAN ) IV, oxyCODONE   Current Outpatient Medications  Medication Instructions   albuterol  (VENTOLIN  HFA) 108 (90 Base) MCG/ACT inhaler 2 puffs, Inhalation, Every 6 hours PRN   cefixime  (SUPRAX ) 400 mg, Oral, Daily   fluconazole  (DIFLUCAN ) 150 MG tablet Take 1 tablet today.  Take second tablet 3 days later.   metroNIDAZOLE  (METROGEL ) 0.75 % vaginal gel Insert vaginally using applicator nightly for 5 nights.  Abstain from sexual intercourse or tampon use until treatment is complete.   Multiple Vitamins-Minerals (WOMENS MULTIVITAMIN) TABS 1 tablet, Daily   ondansetron  (ZOFRAN -ODT) 4 mg, Oral, Every 8 hours PRN    Diet Orders (From admission, onward)     Start     Ordered   02/24/24 1809  Diet regular Room service appropriate? Yes; Fluid consistency: Thin  Diet effective now       Question Answer Comment  Room service appropriate? Yes   Fluid consistency: Thin      02/24/24 1809  DVT prophylaxis: enoxaparin  (LOVENOX ) injection 40 mg Start: 02/24/24 1045   Lab Results  Component Value Date   PLT 455 (H) 02/27/2024      Code Status: Full Code  Family Communication: no family at bedside   Status is: Inpatient Remains inpatient appropriate because: Severity of illness,  lower extremity weakness, IV antibiotics   Level of care: Telemetry  Consultants:  ID  Objective: Vitals:   02/26/24 0754 02/26/24 1718 02/26/24 1925 02/27/24 0522  BP: 118/85 (!) 129/99 (!) 122/96 (!) 125/94  Pulse: 81 83 97 78  Resp: 19 19 20 18   Temp: 98.2 F (36.8 C) 98.5 F (36.9 C) 98.4 F (36.9 C) 97.9 F (36.6 C)  TempSrc:   Oral   SpO2: 98% 95% 99% 100%  Weight:      Height:        Intake/Output Summary (Last 24 hours) at 02/27/2024 1007 Last data filed at 02/27/2024 0557 Gross per 24 hour  Intake 900 ml  Output 950 ml  Net -50 ml   Wt Readings from Last 3 Encounters:  02/24/24 73 kg  06/17/23 73.9 kg  11/18/22 76 kg    Examination:  Constitutional: NAD Eyes: lids and conjunctivae normal, no scleral icterus ENMT: mmm Neck: normal, supple Respiratory: clear to auscultation bilaterally, no wheezing, no crackles. Normal respiratory effort.  Cardiovascular: Regular rate and rhythm, no murmurs / rubs / gallops. No LE edema. Abdomen: soft, no distention, no tenderness. Bowel sounds positive.   Data Reviewed: I have independently reviewed following labs and imaging studies   CBC Recent Labs  Lab 02/20/24 1918 02/23/24 1035 02/23/24 1055 02/25/24 0531 02/27/24 0729  WBC 6.3 8.6  --  3.7* 8.4  HGB 10.3* 9.5* 10.2*  10.5* 9.1* 9.8*  HCT 33.1* 30.8* 30.0*  31.0* 29.1* 31.5*  PLT 422* 373  --  416* 455*  MCV 85.5 84.6  --  83.4 84.2  MCH 26.6 26.1  --  26.1 26.2  MCHC 31.1 30.8  --  31.3 31.1  RDW 13.7 13.9  --  14.0 13.9  LYMPHSABS 0.6* 0.6*  --   --   --   MONOABS 0.4 0.6  --   --   --   EOSABS 0.1 0.0  --   --   --   BASOSABS 0.0 0.0  --   --   --     Recent Labs  Lab 02/20/24 1918 02/23/24 1035 02/23/24 1055 02/23/24 1056 02/25/24 0531 02/27/24 0729  NA 137 137 135  135  --  138 139  K 3.5 3.4* 4.7  4.6  --  4.1 3.2*  CL 102 97* 96*  --  101 103  CO2 23 28  --   --  27 27  GLUCOSE 114* 98 97  --  136* 76  BUN 8 5* 6  --  8 9   CREATININE 0.68 0.82 0.80  --  0.47 0.51  CALCIUM 8.2* 8.4*  --   --  8.3* 8.6*  AST 23 23  --   --  17  --   ALT 7 8  --   --  8  --   ALKPHOS 91 76  --   --  72  --   BILITOT 0.4 0.6  --   --  <0.2  --   ALBUMIN 2.1* 1.8*  --   --  1.6*  --   MG  --   --   --   --  2.0  1.9  LATICACIDVEN  --   --   --  1.3  --   --     ------------------------------------------------------------------------------------------------------------------ No results for input(s): CHOL, HDL, LDLCALC, TRIG, CHOLHDL, LDLDIRECT in the last 72 hours.  Lab Results  Component Value Date   HGBA1C 5.6 04/08/2022   ------------------------------------------------------------------------------------------------------------------ No results for input(s): TSH, T4TOTAL, T3FREE, THYROIDAB in the last 72 hours.  Invalid input(s): FREET3  Cardiac Enzymes No results for input(s): CKMB, TROPONINI, MYOGLOBIN in the last 168 hours.  Invalid input(s): CK ------------------------------------------------------------------------------------------------------------------ No results found for: BNP  CBG: No results for input(s): GLUCAP in the last 168 hours.  Recent Results (from the past 240 hours)  Urine Culture     Status: Abnormal   Collection Time: 02/20/24  5:57 PM   Specimen: Urine, Clean Catch  Result Value Ref Range Status   Specimen Description URINE, CLEAN CATCH  Final   Special Requests   Final    NONE Performed at [facility], [address], [place], [place] [address]    Culture MULTIPLE SPECIES PRESENT, SUGGEST RECOLLECTION (A)  Final   Report Status 02/21/2024 FINAL  Final  Culture, blood (routine x 2)     Status: None (Preliminary result)   Collection Time: 02/23/24 10:16 AM   Specimen: BLOOD  Result Value Ref Range Status   Specimen Description BLOOD RIGHT ANTECUBITAL  Final   Special Requests   Final    BOTTLES DRAWN AEROBIC AND ANAEROBIC Blood Culture  adequate volume   Culture   Final    NO GROWTH 4 DAYS Performed at [facility], [address], [place], [place] [address]    Report Status PENDING  Incomplete  Resp panel by RT-PCR (RSV, Flu A&B, Covid) Anterior Nasal Swab     Status: None   Collection Time: 02/23/24 10:17 AM   Specimen: Anterior Nasal Swab  Result Value Ref Range Status   SARS Coronavirus 2 by RT PCR NEGATIVE NEGATIVE Final   Influenza A by PCR NEGATIVE NEGATIVE Final   Influenza B by PCR NEGATIVE NEGATIVE Final    Comment: (NOTE) The Xpert Xpress SARS-CoV-2/FLU/RSV plus assay is intended as an aid in the diagnosis of influenza from Nasopharyngeal swab specimens and should not be used as a sole basis for treatment. Nasal washings and aspirates are unacceptable for Xpert Xpress SARS-CoV-2/FLU/RSV testing.  Fact Sheet for Patients: bloggercourse.com  Fact Sheet for Healthcare Providers: seriousbroker.it  This test is not yet approved or cleared by the [place]  FDA and has been authorized for detection and/or diagnosis of SARS-CoV-2 by FDA under an Emergency Use Authorization (EUA). This EUA will remain in effect (meaning this test can be used) for the duration of the COVID-19 declaration under Section 564(b)(1) of the Act, 21 U.S.C. section 360bbb-3(b)(1), unless the authorization is terminated or revoked.     Resp Syncytial Virus by PCR NEGATIVE NEGATIVE Final    Comment: (NOTE) Fact Sheet for Patients: bloggercourse.com  Fact Sheet for Healthcare Providers: seriousbroker.it  This test is not yet approved or cleared by the [place]  FDA and has been authorized for detection and/or diagnosis of SARS-CoV-2 by FDA under an Emergency Use Authorization (EUA). This EUA will remain in effect (meaning this test can be used) for the duration of the COVID-19 declaration under Section 564(b)(1) of the  Act, 21 U.S.C. section 360bbb-3(b)(1), unless the authorization is terminated or revoked.  Performed at [facility], [address], [place], [place] [address]   Respiratory (~  20 pathogens) panel by PCR     Status: Abnormal   Collection Time: 02/23/24  9:31 PM   Specimen: Nasopharyngeal Swab; Respiratory  Result Value Ref Range Status   Adenovirus NOT DETECTED NOT DETECTED Final   Coronavirus 229E NOT DETECTED NOT DETECTED Final    Comment: (NOTE) The Coronavirus on the Respiratory Panel, DOES NOT test for the novel  Coronavirus (2019 nCoV)    Coronavirus HKU1 NOT DETECTED NOT DETECTED Final   Coronavirus NL63 NOT DETECTED NOT DETECTED Final   Coronavirus OC43 NOT DETECTED NOT DETECTED Final   Metapneumovirus NOT DETECTED NOT DETECTED Final   Rhinovirus / Enterovirus DETECTED (A) NOT DETECTED Final   Influenza A NOT DETECTED NOT DETECTED Final   Influenza B NOT DETECTED NOT DETECTED Final   Parainfluenza Virus 1 NOT DETECTED NOT DETECTED Final   Parainfluenza Virus 2 NOT DETECTED NOT DETECTED Final   Parainfluenza Virus 3 NOT DETECTED NOT DETECTED Final   Parainfluenza Virus 4 NOT DETECTED NOT DETECTED Final   Respiratory Syncytial Virus NOT DETECTED NOT DETECTED Final   Bordetella pertussis NOT DETECTED NOT DETECTED Final   Bordetella Parapertussis NOT DETECTED NOT DETECTED Final   Chlamydophila pneumoniae NOT DETECTED NOT DETECTED Final   Mycoplasma pneumoniae NOT DETECTED NOT DETECTED Final    Comment: Performed at [facility], [address] [place], [place] [address]  Culture, blood (routine x 2)     Status: None (Preliminary result)   Collection Time: 02/23/24 10:09 PM   Specimen: BLOOD  Result Value Ref Range Status   Specimen Description BLOOD RIGHT ANTECUBITAL  Final   Special Requests   Final    BOTTLES DRAWN AEROBIC AND ANAEROBIC Blood Culture adequate volume   Culture   Final    NO GROWTH 4 DAYS Performed at [facility], [address], [place], [place] [address]    Report Status PENDING  Incomplete     Radiology Studies: MR Lumbar Spine W Wo Contrast Result Date: 02/26/2024 EXAM: MRI LUMBAR SPINE 02/26/2024 04:39:57 PM TECHNIQUE: Multiplanar multisequence MRI of the lumbar spine was performed without and with the administration of 7.5 mL gadobutrol  (GADAVIST ) 1 MMOL/ML injection. COMPARISON: None available. CLINICAL HISTORY: Myelopathy, acute, lumbar spine. HIV/AIDS. Bilateral lower extremity weakness. Pneumonia. FINDINGS: BONES AND ALIGNMENT: Normal alignment. Normal vertebral body heights. Bone marrow signal is unremarkable. SPINAL CORD: The conus terminates normally. SOFT TISSUES: No paraspinal mass. L1-L2: No significant disc herniation. No spinal canal stenosis or neural foraminal narrowing. L2-L3: No significant disc herniation. No spinal canal stenosis or neural foraminal narrowing. L3-L4: No significant disc herniation. No spinal canal stenosis or neural foraminal narrowing. L4-L5: No significant disc herniation. No spinal canal stenosis or neural foraminal narrowing. L5-S1: No significant disc herniation. No spinal canal stenosis or neural foraminal narrowing. IMPRESSION: 1. No acute or focal lumbar spine abnormality to explain bilateral lower extremity weakness. Electronically signed by: Lonni Necessary MD 02/26/2024 06:33 PM EST RP Workstation: [id number]     Nilda Fendt, MD, PhD [facility]  Between 7 am - 7 pm I am available, please contact me via Amion (for emergencies) or Securechat (non urgent messages)  Between 7 pm - 7 am I am not available, please contact night coverage MD/APP via Amion

## 2024-02-27 NOTE — Progress Notes (Signed)
 Subjective:  She still feels fairly fatigued   Antibiotics:  Anti-infectives (From admission, onward)    Start     Dose/Rate Route Frequency Ordered Stop   02/24/24 1045  fluconazole  (DIFLUCAN ) tablet 150 mg        150 mg Oral  Once 02/24/24 1030 02/24/24 1118   02/23/24 2200  cefTRIAXone  (ROCEPHIN ) 1 g in sodium chloride  0.9 % 100 mL IVPB        1 g 200 mL/hr over 30 Minutes Intravenous Every 24 hours 02/23/24 1237 02/28/24 2159   02/23/24 1245  azithromycin  (ZITHROMAX ) tablet 500 mg        500 mg Oral Daily 02/23/24 1237 02/25/24 0908   02/23/24 1100  ceFEPIme  (MAXIPIME ) 2 g in sodium chloride  0.9 % 100 mL IVPB        2 g 200 mL/hr over 30 Minutes Intravenous  Once 02/23/24 1045 02/23/24 1214   02/23/24 1100  vancomycin  (VANCOREADY) IVPB 1250 mg/250 mL        1,250 mg 166.7 mL/hr over 90 Minutes Intravenous  Once 02/23/24 1045 02/23/24 1413       Medications: Scheduled Meds:  Chlorhexidine  Gluconate Cloth  6 each Topical Q0600   enoxaparin  (LOVENOX ) injection  40 mg Subcutaneous Q24H   hydrocortisone   1 Application Rectal BID   metroNIDAZOLE   1 Applicatorful Vaginal QHS   Continuous Infusions:  cefTRIAXone  (ROCEPHIN )  IV 1 g (02/26/24 2116)   PRN Meds:.acetaminophen , ipratropium-albuterol , ondansetron  (ZOFRAN ) IV, oxyCODONE     Objective: Weight change:   Intake/Output Summary (Last 24 hours) at 02/27/2024 1244 Last data filed at 02/27/2024 0557 Gross per 24 hour  Intake 500 ml  Output 850 ml  Net -350 ml   Blood pressure (!) 125/94, pulse 78, temperature 97.9 F (36.6 C), resp. rate 18, height 5' 2 (1.575 m), weight 73 kg, last menstrual period 01/24/2024, SpO2 100%. Temp:  [97.9 F (36.6 C)-98.5 F (36.9 C)] 97.9 F (36.6 C) (12/07 0522) Pulse Rate:  [78-97] 78 (12/07 0522) Resp:  [18-20] 18 (12/07 0522) BP: (122-129)/(94-99) 125/94 (12/07 0522) SpO2:  [95 %-100 %] 100 % (12/07 0522)  Physical Exam: Physical Exam Constitutional:       Appearance: She is underweight.  Cardiovascular:     Rate and Rhythm: Normal rate and regular rhythm.  Pulmonary:     Effort: No respiratory distress.     Breath sounds: No wheezing.  Abdominal:     General: There is no distension.  Musculoskeletal:        General: Normal range of motion.  Skin:    General: Skin is warm and dry.  Neurological:     Mental Status: She is alert and oriented to person, place, and time.  Psychiatric:        Mood and Affect: Mood normal.        Behavior: Behavior normal.        Thought Content: Thought content normal.        Judgment: Judgment normal.      CBC:    BMET Recent Labs    02/25/24 0531 02/27/24 0729  NA 138 139  K 4.1 3.2*  CL 101 103  CO2 27 27  GLUCOSE 136* 76  BUN 8 9  CREATININE 0.47 0.51  CALCIUM 8.3* 8.6*     Liver Panel  Recent Labs    02/25/24 0531  PROT 6.7  ALBUMIN 1.6*  AST 17  ALT 8  ALKPHOS 72  BILITOT <  0.2       Sedimentation Rate No results for input(s): ESRSEDRATE in the last 72 hours. C-Reactive Protein No results for input(s): CRP in the last 72 hours.  Micro Results: Recent Results (from the past 720 hours)  Urine Culture     Status: Abnormal   Collection Time: 02/20/24  5:57 PM   Specimen: Urine, Clean Catch  Result Value Ref Range Status   Specimen Description URINE, CLEAN CATCH  Final   Special Requests   Final    NONE Performed at [facility], [address], [place], [place] [address]    Culture MULTIPLE SPECIES PRESENT, SUGGEST RECOLLECTION (A)  Final   Report Status 02/21/2024 FINAL  Final  Culture, blood (routine x 2)     Status: None (Preliminary result)   Collection Time: 02/23/24 10:16 AM   Specimen: BLOOD  Result Value Ref Range Status   Specimen Description BLOOD RIGHT ANTECUBITAL  Final   Special Requests   Final    BOTTLES DRAWN AEROBIC AND ANAEROBIC Blood Culture adequate volume   Culture   Final    NO GROWTH 4 DAYS Performed at [facility]  Lab, [address], [place], [place] [address]    Report Status PENDING  Incomplete  Resp panel by RT-PCR (RSV, Flu A&B, Covid) Anterior Nasal Swab     Status: None   Collection Time: 02/23/24 10:17 AM   Specimen: Anterior Nasal Swab  Result Value Ref Range Status   SARS Coronavirus 2 by RT PCR NEGATIVE NEGATIVE Final   Influenza A by PCR NEGATIVE NEGATIVE Final   Influenza B by PCR NEGATIVE NEGATIVE Final    Comment: (NOTE) The Xpert Xpress SARS-CoV-2/FLU/RSV plus assay is intended as an aid in the diagnosis of influenza from Nasopharyngeal swab specimens and should not be used as a sole basis for treatment. Nasal washings and aspirates are unacceptable for Xpert Xpress SARS-CoV-2/FLU/RSV testing.  Fact Sheet for Patients: bloggercourse.com  Fact Sheet for Healthcare Providers: seriousbroker.it  This test is not yet approved or cleared by the [place]  FDA and has been authorized for detection and/or diagnosis of SARS-CoV-2 by FDA under an Emergency Use Authorization (EUA). This EUA will remain in effect (meaning this test can be used) for the duration of the COVID-19 declaration under Section 564(b)(1) of the Act, 21 U.S.C. section 360bbb-3(b)(1), unless the authorization is terminated or revoked.     Resp Syncytial Virus by PCR NEGATIVE NEGATIVE Final    Comment: (NOTE) Fact Sheet for Patients: bloggercourse.com  Fact Sheet for Healthcare Providers: seriousbroker.it  This test is not yet approved or cleared by the [place]  FDA and has been authorized for detection and/or diagnosis of SARS-CoV-2 by FDA under an Emergency Use Authorization (EUA). This EUA will remain in effect (meaning this test can be used) for the duration of the COVID-19 declaration under Section 564(b)(1) of the Act, 21 U.S.C. section 360bbb-3(b)(1), unless the authorization is terminated  or revoked.  Performed at [facility], [address], [place], [place] [address]   Respiratory (~20 pathogens) panel by PCR     Status: Abnormal   Collection Time: 02/23/24  9:31 PM   Specimen: Nasopharyngeal Swab; Respiratory  Result Value Ref Range Status   Adenovirus NOT DETECTED NOT DETECTED Final   Coronavirus 229E NOT DETECTED NOT DETECTED Final    Comment: (NOTE) The Coronavirus on the Respiratory Panel, DOES NOT test for the novel  Coronavirus (2019 nCoV)    Coronavirus HKU1  NOT DETECTED NOT DETECTED Final   Coronavirus NL63 NOT DETECTED NOT DETECTED Final   Coronavirus OC43 NOT DETECTED NOT DETECTED Final   Metapneumovirus NOT DETECTED NOT DETECTED Final   Rhinovirus / Enterovirus DETECTED (A) NOT DETECTED Final   Influenza A NOT DETECTED NOT DETECTED Final   Influenza B NOT DETECTED NOT DETECTED Final   Parainfluenza Virus 1 NOT DETECTED NOT DETECTED Final   Parainfluenza Virus 2 NOT DETECTED NOT DETECTED Final   Parainfluenza Virus 3 NOT DETECTED NOT DETECTED Final   Parainfluenza Virus 4 NOT DETECTED NOT DETECTED Final   Respiratory Syncytial Virus NOT DETECTED NOT DETECTED Final   Bordetella pertussis NOT DETECTED NOT DETECTED Final   Bordetella Parapertussis NOT DETECTED NOT DETECTED Final   Chlamydophila pneumoniae NOT DETECTED NOT DETECTED Final   Mycoplasma pneumoniae NOT DETECTED NOT DETECTED Final    Comment: Performed at [facility], [address], [place], [place] [address]  Culture, blood (routine x 2)     Status: None (Preliminary result)   Collection Time: 02/23/24 10:09 PM   Specimen: BLOOD  Result Value Ref Range Status   Specimen Description BLOOD RIGHT ANTECUBITAL  Final   Special Requests   Final    BOTTLES DRAWN AEROBIC AND ANAEROBIC Blood Culture adequate volume   Culture   Final    NO GROWTH 4 DAYS Performed at [facility], [address], [place], [place] [address]    Report Status PENDING  Incomplete     Studies/Results: ECHOCARDIOGRAM COMPLETE Result Date: 02/27/2024    ECHOCARDIOGRAM REPORT   Patient Name:   [name] Date of Exam: 02/27/2024 Medical Rec #:  [id number]        Height:       62.0 in Accession #:    [id number]       Weight:       161.0 lb Date of Birth:  03-28-1985        BSA:          1.743 m Patient Age:    38 years         BP:           125/94 mmHg Patient Gender: F                HR:           94 bpm. Exam Location:  Inpatient Procedure: 2D Echo, Cardiac Doppler and Color Doppler (Both Spectral and Color            Flow Doppler were utilized during procedure). Indications:    Fever  History:        Patient has no prior history of Echocardiogram examinations.                 AIDS.  Sonographer:    Sherlean Dubin Referring Phys: [id number]all N VAN DAM  Sonographer Comments: Image acquisition challenging due to respiratory motion. IMPRESSIONS  1. Left ventricular ejection fraction, by estimation, is >75%. The left ventricle has hyperdynamic function. The left ventricle has no regional wall motion abnormalities. Left ventricular diastolic parameters were normal.  2. Right ventricular systolic function is normal. The right ventricular size is normal.  3. The mitral valve is normal in structure. Trivial mitral valve regurgitation.  4. The aortic valve is tricuspid. Aortic valve regurgitation is not visualized.  5. The inferior vena cava is normal in size with <50% respiratory variability, suggesting right atrial pressure of 8 mmHg. FINDINGS  Left  Ventricle: Left ventricular ejection fraction, by estimation, is >75%. The left ventricle has hyperdynamic function. The left ventricle has no regional wall motion abnormalities. The left ventricular internal cavity size was normal in size. There is no left ventricular hypertrophy. Left ventricular diastolic parameters were normal. Right Ventricle: The right ventricular size is normal. Right vetricular wall thickness was not assessed. Right  ventricular systolic function is normal. Left Atrium: Left atrial size was normal in size. Right Atrium: Right atrial size was normal in size. Pericardium: Trivial pericardial effusion is present. Mitral Valve: The mitral valve is normal in structure. Trivial mitral valve regurgitation. Tricuspid Valve: The tricuspid valve is normal in structure. Tricuspid valve regurgitation is trivial. Aortic Valve: The aortic valve is tricuspid. Aortic valve regurgitation is not visualized. Aortic valve peak gradient measures 6.1 mmHg. Pulmonic Valve: The pulmonic valve was not well visualized. Pulmonic valve regurgitation is not visualized. Aorta: The aortic root and ascending aorta are structurally normal, with no evidence of dilitation. Venous: The inferior vena cava is normal in size with less than 50% respiratory variability, suggesting right atrial pressure of 8 mmHg. IAS/Shunts: No atrial level shunt detected by color flow Doppler.  LEFT VENTRICLE PLAX 2D LVIDd:         4.20 cm   Diastology LVIDs:         2.30 cm   LV e' medial:    8.49 cm/s LV PW:         0.90 cm   LV E/e' medial:  9.1 LV IVS:        1.00 cm   LV e' lateral:   10.80 cm/s LVOT diam:     1.90 cm   LV E/e' lateral: 7.2 LV SV:         52 LV SV Index:   30 LVOT Area:     2.84 cm  RIGHT VENTRICLE             IVC RV S prime:     11.70 cm/s  IVC diam: 1.00 cm TAPSE (M-mode): 1.7 cm                             PULMONARY VEINS                             Diastolic Velocity: 29.20 cm/s                             S/D Velocity:       1.30                             Systolic Velocity:  36.90 cm/s LEFT ATRIUM           Index        RIGHT ATRIUM           Index LA diam:      2.90 cm 1.66 cm/m   RA Area:     14.70 cm LA Vol (A2C): 36.6 ml 20.99 ml/m  RA Volume:   35.10 ml  20.13 ml/m LA Vol (A4C): 52.8 ml 30.29 ml/m  AORTIC VALVE AV Area (Vmax): 2.35 cm AV Vmax:        123.00 cm/s AV Peak Grad:   6.1 mmHg LVOT Vmax:      102.00 cm/s LVOT Vmean:  68.800 cm/s  LVOT VTI:       0.183 m  AORTA Ao Root diam: 2.30 cm Ao Asc diam:  2.40 cm MITRAL VALVE MV Area (PHT): 4.89 cm    SHUNTS MV Decel Time: 155 msec    Systemic VTI:  0.18 m MV E velocity: 77.60 cm/s  Systemic Diam: 1.90 cm MV A velocity: 59.50 cm/s MV E/A ratio:  1.30 Vina Gull MD Electronically signed by Vina Gull MD Signature Date/Time: 02/27/2024/10:23:53 AM    Final    MR Lumbar Spine W Wo Contrast Result Date: 02/26/2024 EXAM: MRI LUMBAR SPINE 02/26/2024 04:39:57 PM TECHNIQUE: Multiplanar multisequence MRI of the lumbar spine was performed without and with the administration of 7.5 mL gadobutrol  (GADAVIST ) 1 MMOL/ML injection. COMPARISON: None available. CLINICAL HISTORY: Myelopathy, acute, lumbar spine. HIV/AIDS. Bilateral lower extremity weakness. Pneumonia. FINDINGS: BONES AND ALIGNMENT: Normal alignment. Normal vertebral body heights. Bone marrow signal is unremarkable. SPINAL CORD: The conus terminates normally. SOFT TISSUES: No paraspinal mass. L1-L2: No significant disc herniation. No spinal canal stenosis or neural foraminal narrowing. L2-L3: No significant disc herniation. No spinal canal stenosis or neural foraminal narrowing. L3-L4: No significant disc herniation. No spinal canal stenosis or neural foraminal narrowing. L4-L5: No significant disc herniation. No spinal canal stenosis or neural foraminal narrowing. L5-S1: No significant disc herniation. No spinal canal stenosis or neural foraminal narrowing. IMPRESSION: 1. No acute or focal lumbar spine abnormality to explain bilateral lower extremity weakness. Electronically signed by: Lonni Necessary MD 02/26/2024 06:33 PM EST RP Workstation: [id number]   CT CHEST ABDOMEN PELVIS W CONTRAST Result Date: 02/25/2024 CLINICAL DATA:  Groin lymphadenopathy. EXAM: CT CHEST, ABDOMEN, AND PELVIS WITH CONTRAST TECHNIQUE: Multidetector CT imaging of the chest, abdomen and pelvis was performed following the standard protocol during bolus administration  of intravenous contrast. RADIATION DOSE REDUCTION: This exam was performed according to the departmental dose-optimization program which includes automated exposure control, adjustment of the mA and/or kV according to patient size and/or use of iterative reconstruction technique. CONTRAST:  75mL OMNIPAQUE  IOHEXOL  350 MG/ML SOLN COMPARISON:  Abdominopelvic CT 06/17/2023, chest radiograph 02/23/2024 FINDINGS: CT CHEST FINDINGS Cardiovascular: The heart is normal in size. Trace pericardial effusion. No obvious central pulmonary embolus, exam not tailored to pulmonary artery assessment. The thoracic aorta is normal in caliber. Mediastinum/Nodes: No enlarged mediastinal or hilar lymph nodes. No axillary adenopathy. Patulous esophagus without wall thickening. No visible thyroid nodule. Lungs/Pleura: Right greater than left lower lobe consolidation with air bronchograms. Diffuse ground-glass densities with a slightly geographic distribution. Occasional areas of superimposed tree-in-bud opacity and nodularity. Trace pleural effusions. Musculoskeletal: There are no acute or suspicious osseous abnormalities. CT ABDOMEN PELVIS FINDINGS Hepatobiliary: Stable scattered cysts throughout both lobes of the liver. Focal fatty infiltration adjacent to the falciform ligament. No suspicious liver lesion decompressed gallbladder. Pancreas: No ductal dilatation or inflammation. Spleen: Multiple hypodense lesions again seen, slightly increased. Largest lesion measures 4.4 cm, series 5, image 45, previously 4 cm. No splenomegaly. Adrenals/Urinary Tract: The adrenal glands are normal. No hydronephrosis. No renal stones or focal renal lesion. No evidence of renal inflammation. Urinary bladder is decompressed by Foley catheter. Stomach/Bowel: Bowel assessment is limited in the absence of enteric contrast. The stomach is nondistended. No small bowel obstruction or inflammatory change. Fluid/liquid stool throughout the colon. There is no  colonic wall thickening or pericolonic edema. The sigmoid colon is redundant. The appendix is normal. Vascular/Lymphatic: Normal caliber abdominal aorta. Portal and splenic veins are patent. Included inguinal lymph nodes are not enlarged  by size criteria and demonstrate normal fatty hila. The largest lymph node is in the left inguinal region measuring 8 mm short axis. No suspicious lymphadenopathy in the abdomen or pelvis. Reproductive: Prominent left periuterine and adnexal vascularity with dilatation of the ovarian vein. The uterus is anteverted. Small amount of fluid in the vagina. No adnexal mass. Other: Trace free fluid in the pelvis. No focal fluid collection. Tiny fat containing umbilical hernia. No ascites. Musculoskeletal: There are no acute or suspicious osseous abnormalities. IMPRESSION: 1. No suspicious lymphadenopathy in the chest, abdomen, or pelvis. 2. Right greater than left lower lobe consolidation with air bronchograms, suspicious for pneumonia. Diffuse ground-glass densities with a slightly geographic distribution, with occasional areas of superimposed tree-in-bud opacity and nodularity. Findings are suspicious for typically infectious. 3. Multiple hypodense lesions in the spleen, slightly increased in size from prior exam. These are nonspecific. Splenic lesions are typically benign, although the possibility of superimposed infection is considered. 4. Fluid/liquid stool throughout the colon, can be seen with diarrheal illness. 5. Prominent left periuterine and adnexal vascularity with dilatation of the ovarian vein, can be seen with pelvic congestion syndrome in the appropriate clinical setting. Electronically Signed   By: Andrea Gasman M.D.   On: 02/25/2024 15:44      Assessment/Plan:  INTERVAL HISTORY: MRI of her spine was without evidence of infection Principal Problem:   CAP (community acquired pneumonia) Active Problems:   AIDS (HCC)   Hypoxia   Splenic lesion   Nonadherence  to medical treatment    [name] is a 38 y.o. female with advanced HIV AIDS and CD4 count that was not detectable when last checked admitted for pneumonia with rhinovirus enterovirus isolated on 20 pathogen respiratory panel currently also on antibacterial antibiotics in the form of ceftriaxone  and azithromycin .  Fever has defervesced but she still does not feel that great her LDH was normal. We certain wely certainly about PCP though given the fact she seems to be getting better and that she does have a viral pathogen to blame her pneumonia on I am not going to initiate PCP treatment at this point in time  She has some nodules that were seen on imaging I will send a serum cryptococcal antigen = negative urine histoplasma and Blastomyces antigens.  #1 Pneumonia: I would favor this being a viral or viral mixed with bacterial infection rather than PCP at this point given that she seems to be improving  I will also send those serum cryptococcal antigen and urine histo and Blastomyces antigens  #2 splenic lesion is not clear what has caused these I will get a 2D echocardiogram in case she has endocarditis that is causing it but her blood cultures are certainly negative  Echocardiogram done and no evidence of vegetations though it is a suboptimal study.    #3 Malaise and back pain  MRI negative  #4 LE weakness:  I think it is important that we rule out a CNS process I have ordered an MRI of the brain with and without contrast to look for pathology such as CNS toxoplasmosis primary CNS lymphoma PML.  I do not think she would have cryptococcal meningitis and she does not have consistent headaches and for what is worth her serum cryptococcal antigen is negative   #4 HIV/AIDS  She was initially under the care of Dr. Janna and on a boosted protease inhibitor which I believe at that time was Kaletra with Combivir later on REYATAZ  NORVIR  Truvada  then seen briefly  by my former partner Dr.  Efrain and rx Tivicay  and Descovy.  During that period she for what ever reason stopped taking the DESCOVY and took the TIVICAY  alone and so there is been anxiety about integrase resistance.  She then establish care in [facility] where she was being treated with twice daily darunavir  ritonavir  with Epzicom  though they had wished to start her on integrates inhibitor when they had checked integrase genotype the assay failed.  When asked her why she has difficulty taking her medications she says that they keep giving me all of these big medicines.  I would ideally like to place her on something like BIKTARVY given her history of taking TIVICAY  alone I would not trust her to be on a multi tablet regimen that was not based on a protease inhibitor.  We could also certainly use SYMTUZA  and crush it.  THere is no way she has protease R and we dont even have any documentation of NRTI resistance.  We know that SYMTUZA  is FDA indicated in the context of 24D but I also note the personal experience that I have seen multiple patients with a K65Rand a 184 the suppressed quite perfectly on SYMTUZA   I also told her that she will need to be on PCP prophylaxis either with crushed Bactrim  since she dislikes big pills or dapsone if she is not G6PD deficient check if we have looked for that  I am holding off on adding antiretroviral therapy until we have the MRI of her brain  #5 Droplet and contact precautions for her rhinovirus enterovirus infection  I personally spent a total of 50 minutes in the care of the patient today including preparing to see the patient, getting/reviewing separately obtained history, performing a medically appropriate exam/evaluation, counseling and educating, placing orders, documenting clinical information in the EHR, and independently interpreting results.  Evaluation of the patient requires complex antimicrobial therapy evaluation, counseling , isolation needs to reduce disease  transmission and risk assessment and mitigation.   Dr Dea back tomorrow.   LOS: 3 days   Jomarie Fleeta Rothman 02/27/2024, 12:44 PM

## 2024-02-27 NOTE — Plan of Care (Signed)
   Problem: Health Behavior/Discharge Planning: Goal: Ability to manage health-related needs will improve Outcome: Progressing   Problem: Activity: Goal: Risk for activity intolerance will decrease Outcome: Progressing   Problem: Nutrition: Goal: Adequate nutrition will be maintained Outcome: Progressing   Problem: Coping: Goal: Level of anxiety will decrease Outcome: Progressing

## 2024-02-28 ENCOUNTER — Telehealth (HOSPITAL_COMMUNITY): Payer: Self-pay

## 2024-02-28 ENCOUNTER — Other Ambulatory Visit (HOSPITAL_COMMUNITY): Payer: Self-pay

## 2024-02-28 DIAGNOSIS — B341 Enterovirus infection, unspecified: Secondary | ICD-10-CM

## 2024-02-28 DIAGNOSIS — B348 Other viral infections of unspecified site: Secondary | ICD-10-CM

## 2024-02-28 DIAGNOSIS — R0902 Hypoxemia: Secondary | ICD-10-CM

## 2024-02-28 DIAGNOSIS — J189 Pneumonia, unspecified organism: Secondary | ICD-10-CM | POA: Diagnosis not present

## 2024-02-28 DIAGNOSIS — Z91119 Patient's noncompliance with dietary regimen due to unspecified reason: Secondary | ICD-10-CM

## 2024-02-28 LAB — CULTURE, BLOOD (ROUTINE X 2)
Culture: NO GROWTH
Culture: NO GROWTH
Special Requests: ADEQUATE
Special Requests: ADEQUATE

## 2024-02-28 LAB — GC/CHLAMYDIA PROBE AMP (~~LOC~~) NOT AT ARMC
Chlamydia: NEGATIVE
Comment: NEGATIVE
Comment: NORMAL
Neisseria Gonorrhea: NEGATIVE

## 2024-02-28 MED ORDER — SULFAMETHOXAZOLE-TRIMETHOPRIM 400-80 MG PO TABS
1.0000 | ORAL_TABLET | Freq: Every day | ORAL | Status: DC
  Administered 2024-02-28 – 2024-02-29 (×2): 1 via ORAL
  Filled 2024-02-28 (×2): qty 1

## 2024-02-28 MED ORDER — DARUN-COBIC-EMTRICIT-TENOFAF 800-150-200-10 MG PO TABS
1.0000 | ORAL_TABLET | Freq: Every day | ORAL | 0 refills | Status: DC
  Filled 2024-02-28: qty 30, 30d supply, fill #0

## 2024-02-28 MED ORDER — SULFAMETHOXAZOLE-TRIMETHOPRIM 400-80 MG PO TABS
1.0000 | ORAL_TABLET | Freq: Every day | ORAL | 0 refills | Status: DC
  Filled 2024-02-28: qty 30, 30d supply, fill #0

## 2024-02-28 MED ORDER — POTASSIUM CHLORIDE CRYS ER 20 MEQ PO TBCR
40.0000 meq | EXTENDED_RELEASE_TABLET | Freq: Once | ORAL | Status: AC
  Administered 2024-02-28: 40 meq via ORAL
  Filled 2024-02-28: qty 2

## 2024-02-28 NOTE — Progress Notes (Signed)
 PROGRESS NOTE  [name] FMW:[id number] DOB: June 15, 1985 DOA: 02/23/2024 PCP: Gareth Mliss FALCON, FNP   LOS: 4 days   Brief Narrative / Interim history: 38 year old patient with HIV, asthma, depression, recently diagnosed pneumonia and on antibiotics comes into the hospital with persistent fever, chills, fatigue and generalized weakness.  She tells me that when EMS arrived at her home she was too weak that she could not ambulate.  X-ray on admission showed multifocal pneumonia, she was placed on antibiotics and admitted to the hospital.  RVP was positive for rhinovirus.  ID was consulted in the setting of underlying HIV as she was not taking her ART  Patient does not want her diagnoses and medical care disclosed to any family members  Subjective / 24h Interval events: Feels stronger, still having a cough but now able to ambulate better, urinating on her own after Foley removal  Assesement and Plan: Principal problem Sepsis due to multifocal pneumonia, rhinovirus infection -her pneumonia is probably due to rhinovirus infection, however she is at risk for multiple other pathogens given very low CD4 count. - ID consulted, appreciate input.  For now continue antibiotics as below.  RT has not been started yet - Strep pneumo urinary antigen is negative.  Fungitell beta D glucan is pending.  Respiratory cultures, pneumocystis, Legionella still pending.  Cryptococcal antigen is negative  Active problems HIV-has not been taking her ART, CD4, undetectably low, viral load 17,000.  Management per ID  Acute hypoxic respiratory failure, asthma exacerbation-patient was hypoxemic on arrival requiring 2 L supplemental oxygen, also tachypneic with respiratory rate up to mid 30s.  Felt like she was wheezing on admission, and was started on steroids.  Wheezing now resolved, wean off steroids, wean off to room air if tolerates - CT scan of the chest, abdomen and pelvis confirms pneumonia, no other acute  findings  Lower extremity weakness -bilateral, also with inability to urinate and urinary retention.  MR of the lumbar spine as well as MRI of the brain are both unremarkable.  She is now able to mobilize better, Foley has been removed and urinating on her own  Presumed bacterial vaginosis, vulvovaginal candidiasis-on vaginal metronidazole , received fluconazole  x 1  Scheduled Meds:  Chlorhexidine  Gluconate Cloth  6 each Topical Q0600   Darunavir -Cobicistat-Emtricitabine -Tenofovir  Alafenamide  1 tablet Oral Q breakfast   enoxaparin  (LOVENOX ) injection  40 mg Subcutaneous Q24H   hydrocortisone   1 Application Rectal BID   metroNIDAZOLE   1 Applicatorful Vaginal QHS   Continuous Infusions:   PRN Meds:.acetaminophen , ipratropium-albuterol , ondansetron  (ZOFRAN ) IV, oxyCODONE   Current Outpatient Medications  Medication Instructions   albuterol  (VENTOLIN  HFA) 108 (90 Base) MCG/ACT inhaler 2 puffs, Inhalation, Every 6 hours PRN   fluconazole  (DIFLUCAN ) 150 MG tablet Take 1 tablet today.  Take second tablet 3 days later.   metroNIDAZOLE  (METROGEL ) 0.75 % vaginal gel Insert vaginally using applicator nightly for 5 nights.  Abstain from sexual intercourse or tampon use until treatment is complete.   Multiple Vitamins-Minerals (WOMENS MULTIVITAMIN) TABS 1 tablet, Daily   ondansetron  (ZOFRAN -ODT) 4 mg, Oral, Every 8 hours PRN    Diet Orders (From admission, onward)     Start     Ordered   02/24/24 1809  Diet regular Room service appropriate? Yes; Fluid consistency: Thin  Diet effective now       Question Answer Comment  Room service appropriate? Yes   Fluid consistency: Thin      02/24/24 1809  DVT prophylaxis: enoxaparin  (LOVENOX ) injection 40 mg Start: 02/24/24 1045   Lab Results  Component Value Date   PLT 455 (H) 02/27/2024      Code Status: Full Code  Family Communication: no family at bedside   Status is: Inpatient Remains inpatient appropriate because:  Severity of illness, lower extremity weakness, IV antibiotics   Level of care: Med-Surg  Consultants:  ID  Objective: Vitals:   02/27/24 0522 02/27/24 2111 02/28/24 0545 02/28/24 0914  BP: (!) 125/94 (!) 127/97 (!) 135/97 108/70  Pulse: 78 (!) 59 77 97  Resp: 18 18 18 18   Temp: 97.9 F (36.6 C) 97.9 F (36.6 C) 98.2 F (36.8 C) 97.7 F (36.5 C)  TempSrc:    Oral  SpO2: 100% 100% 100% 97%  Weight:      Height:        Intake/Output Summary (Last 24 hours) at 02/28/2024 0916 Last data filed at 02/28/2024 0617 Gross per 24 hour  Intake 700 ml  Output 300 ml  Net 400 ml   Wt Readings from Last 3 Encounters:  02/24/24 73 kg  06/17/23 73.9 kg  11/18/22 76 kg    Examination:  Constitutional: NAD Eyes: lids and conjunctivae normal, no scleral icterus ENMT: mmm Neck: normal, supple Respiratory: clear to auscultation bilaterally, no wheezing, no crackles. Normal respiratory effort.  Cardiovascular: Regular rate and rhythm, no murmurs / rubs / gallops. No LE edema. Abdomen: soft, no distention, no tenderness. Bowel sounds positive.   Data Reviewed: I have independently reviewed following labs and imaging studies   CBC Recent Labs  Lab 02/23/24 1035 02/23/24 1055 02/25/24 0531 02/27/24 0729  WBC 8.6  --  3.7* 8.4  HGB 9.5* 10.2*  10.5* 9.1* 9.8*  HCT 30.8* 30.0*  31.0* 29.1* 31.5*  PLT 373  --  416* 455*  MCV 84.6  --  83.4 84.2  MCH 26.1  --  26.1 26.2  MCHC 30.8  --  31.3 31.1  RDW 13.9  --  14.0 13.9  LYMPHSABS 0.6*  --   --   --   MONOABS 0.6  --   --   --   EOSABS 0.0  --   --   --   BASOSABS 0.0  --   --   --     Recent Labs  Lab 02/23/24 1035 02/23/24 1055 02/23/24 1056 02/25/24 0531 02/27/24 0729  NA 137 135  135  --  138 139  K 3.4* 4.7  4.6  --  4.1 3.2*  CL 97* 96*  --  101 103  CO2 28  --   --  27 27  GLUCOSE 98 97  --  136* 76  BUN 5* 6  --  8 9  CREATININE 0.82 0.80  --  0.47 0.51  CALCIUM 8.4*  --   --  8.3* 8.6*  AST 23  --    --  17  --   ALT 8  --   --  8  --   ALKPHOS 76  --   --  72  --   BILITOT 0.6  --   --  <0.2  --   ALBUMIN 1.8*  --   --  1.6*  --   MG  --   --   --  2.0 1.9  LATICACIDVEN  --   --  1.3  --   --     ------------------------------------------------------------------------------------------------------------------ No results for input(s): CHOL, HDL, LDLCALC, TRIG, CHOLHDL, LDLDIRECT in  the last 72 hours.  Lab Results  Component Value Date   HGBA1C 5.6 04/08/2022   ------------------------------------------------------------------------------------------------------------------ No results for input(s): TSH, T4TOTAL, T3FREE, THYROIDAB in the last 72 hours.  Invalid input(s): FREET3  Cardiac Enzymes No results for input(s): CKMB, TROPONINI, MYOGLOBIN in the last 168 hours.  Invalid input(s): CK ------------------------------------------------------------------------------------------------------------------ No results found for: BNP  CBG: No results for input(s): GLUCAP in the last 168 hours.  Recent Results (from the past 240 hours)  Urine Culture     Status: Abnormal   Collection Time: 02/20/24  5:57 PM   Specimen: Urine, Clean Catch  Result Value Ref Range Status   Specimen Description URINE, CLEAN CATCH  Final   Special Requests   Final    NONE Performed at [facility], [address], [place], [place] [address]    Culture MULTIPLE SPECIES PRESENT, SUGGEST RECOLLECTION (A)  Final   Report Status 02/21/2024 FINAL  Final  Culture, blood (routine x 2)     Status: None   Collection Time: 02/23/24 10:16 AM   Specimen: BLOOD  Result Value Ref Range Status   Specimen Description BLOOD RIGHT ANTECUBITAL  Final   Special Requests   Final    BOTTLES DRAWN AEROBIC AND ANAEROBIC Blood Culture adequate volume   Culture   Final    NO GROWTH 5 DAYS Performed at [facility], [address], [place], [place] [address]    Report  Status 02/28/2024 FINAL  Final  Resp panel by RT-PCR (RSV, Flu A&B, Covid) Anterior Nasal Swab     Status: None   Collection Time: 02/23/24 10:17 AM   Specimen: Anterior Nasal Swab  Result Value Ref Range Status   SARS Coronavirus 2 by RT PCR NEGATIVE NEGATIVE Final   Influenza A by PCR NEGATIVE NEGATIVE Final   Influenza B by PCR NEGATIVE NEGATIVE Final    Comment: (NOTE) The Xpert Xpress SARS-CoV-2/FLU/RSV plus assay is intended as an aid in the diagnosis of influenza from Nasopharyngeal swab specimens and should not be used as a sole basis for treatment. Nasal washings and aspirates are unacceptable for Xpert Xpress SARS-CoV-2/FLU/RSV testing.  Fact Sheet for Patients: bloggercourse.com  Fact Sheet for Healthcare Providers: seriousbroker.it  This test is not yet approved or cleared by the [place]  FDA and has been authorized for detection and/or diagnosis of SARS-CoV-2 by FDA under an Emergency Use Authorization (EUA). This EUA will remain in effect (meaning this test can be used) for the duration of the COVID-19 declaration under Section 564(b)(1) of the Act, 21 U.S.C. section 360bbb-3(b)(1), unless the authorization is terminated or revoked.     Resp Syncytial Virus by PCR NEGATIVE NEGATIVE Final    Comment: (NOTE) Fact Sheet for Patients: bloggercourse.com  Fact Sheet for Healthcare Providers: seriousbroker.it  This test is not yet approved or cleared by the [place]  FDA and has been authorized for detection and/or diagnosis of SARS-CoV-2 by FDA under an Emergency Use Authorization (EUA). This EUA will remain in effect (meaning this test can be used) for the duration of the COVID-19 declaration under Section 564(b)(1) of the Act, 21 U.S.C. section 360bbb-3(b)(1), unless the authorization is terminated or revoked.  Performed at [facility], [address], [place], [place] [address]   Respiratory (~20 pathogens) panel by PCR     Status: Abnormal   Collection Time: 02/23/24  9:31 PM   Specimen: Nasopharyngeal Swab; Respiratory  Result Value Ref Range Status   Adenovirus NOT  DETECTED NOT DETECTED Final   Coronavirus 229E NOT DETECTED NOT DETECTED Final    Comment: (NOTE) The Coronavirus on the Respiratory Panel, DOES NOT test for the novel  Coronavirus (2019 nCoV)    Coronavirus HKU1 NOT DETECTED NOT DETECTED Final   Coronavirus NL63 NOT DETECTED NOT DETECTED Final   Coronavirus OC43 NOT DETECTED NOT DETECTED Final   Metapneumovirus NOT DETECTED NOT DETECTED Final   Rhinovirus / Enterovirus DETECTED (A) NOT DETECTED Final   Influenza A NOT DETECTED NOT DETECTED Final   Influenza B NOT DETECTED NOT DETECTED Final   Parainfluenza Virus 1 NOT DETECTED NOT DETECTED Final   Parainfluenza Virus 2 NOT DETECTED NOT DETECTED Final   Parainfluenza Virus 3 NOT DETECTED NOT DETECTED Final   Parainfluenza Virus 4 NOT DETECTED NOT DETECTED Final   Respiratory Syncytial Virus NOT DETECTED NOT DETECTED Final   Bordetella pertussis NOT DETECTED NOT DETECTED Final   Bordetella Parapertussis NOT DETECTED NOT DETECTED Final   Chlamydophila pneumoniae NOT DETECTED NOT DETECTED Final   Mycoplasma pneumoniae NOT DETECTED NOT DETECTED Final    Comment: Performed at [facility], [address] [place], [place] [address]  Culture, blood (routine x 2)     Status: None   Collection Time: 02/23/24 10:09 PM   Specimen: BLOOD  Result Value Ref Range Status   Specimen Description BLOOD RIGHT ANTECUBITAL  Final   Special Requests   Final    BOTTLES DRAWN AEROBIC AND ANAEROBIC Blood Culture adequate volume   Culture   Final    NO GROWTH 5 DAYS Performed at [facility], [address], [place], [place] [address]    Report Status 02/28/2024 FINAL  Final     Radiology Studies: MR BRAIN W WO CONTRAST Result Date: 02/27/2024 EXAM: MRI BRAIN WITH  AND WITHOUT CONTRAST 02/27/2024 04:55:25 PM TECHNIQUE: Multiplanar multisequence MRI of the head/brain was performed with and without the administration of intravenous contrast. CONTRAST: 7.5 mL of Gadavist . COMPARISON: None available. CLINICAL HISTORY: Fever, pneumonia, lower extremity weakness. Prominent adenoid hypertrophy is present. FINDINGS: BRAIN AND VENTRICLES: No acute infarct. No acute intracranial hemorrhage. No mass effect or midline shift. No hydrocephalus. The sella is unremarkable. Normal flow voids. No mass or abnormal enhancement. ORBITS: No acute abnormality. SINUSES: No acute abnormality. BONES AND SOFT TISSUES: Normal bone marrow signal and enhancement. Prominent adenoid hypertrophy is present. No acute soft tissue abnormality. IMPRESSION: 1. No acute intracranial abnormality. 2. No mass or abnormal enhancement. Electronically signed by: Lonni Necessary MD 02/27/2024 05:41 PM EST RP Workstation: [id number]   ECHOCARDIOGRAM COMPLETE Result Date: 02/27/2024    ECHOCARDIOGRAM REPORT   Patient Name:   [name] Date of Exam: 02/27/2024 Medical Rec #:  [id number]        Height:       62.0 in Accession #:    [id number]       Weight:       161.0 lb Date of Birth:  24-Oct-1985        BSA:          1.743 m Patient Age:    38 years         BP:           125/94 mmHg Patient Gender: F                HR:           94 bpm. Exam Location:  Inpatient Procedure: 2D Echo, Cardiac Doppler and Color  Doppler (Both Spectral and Color            Flow Doppler were utilized during procedure). Indications:    Fever  History:        Patient has no prior history of Echocardiogram examinations.                 AIDS.  Sonographer:    Sherlean Dubin Referring Phys: [id number] CORNELIUS N VAN DAM  Sonographer Comments: Image acquisition challenging due to respiratory motion. IMPRESSIONS  1. Left ventricular ejection fraction, by estimation, is >75%. The left ventricle has hyperdynamic function. The left ventricle has no  regional wall motion abnormalities. Left ventricular diastolic parameters were normal.  2. Right ventricular systolic function is normal. The right ventricular size is normal.  3. The mitral valve is normal in structure. Trivial mitral valve regurgitation.  4. The aortic valve is tricuspid. Aortic valve regurgitation is not visualized.  5. The inferior vena cava is normal in size with <50% respiratory variability, suggesting right atrial pressure of 8 mmHg. FINDINGS  Left Ventricle: Left ventricular ejection fraction, by estimation, is >75%. The left ventricle has hyperdynamic function. The left ventricle has no regional wall motion abnormalities. The left ventricular internal cavity size was normal in size. There is no left ventricular hypertrophy. Left ventricular diastolic parameters were normal. Right Ventricle: The right ventricular size is normal. Right vetricular wall thickness was not assessed. Right ventricular systolic function is normal. Left Atrium: Left atrial size was normal in size. Right Atrium: Right atrial size was normal in size. Pericardium: Trivial pericardial effusion is present. Mitral Valve: The mitral valve is normal in structure. Trivial mitral valve regurgitation. Tricuspid Valve: The tricuspid valve is normal in structure. Tricuspid valve regurgitation is trivial. Aortic Valve: The aortic valve is tricuspid. Aortic valve regurgitation is not visualized. Aortic valve peak gradient measures 6.1 mmHg. Pulmonic Valve: The pulmonic valve was not well visualized. Pulmonic valve regurgitation is not visualized. Aorta: The aortic root and ascending aorta are structurally normal, with no evidence of dilitation. Venous: The inferior vena cava is normal in size with less than 50% respiratory variability, suggesting right atrial pressure of 8 mmHg. IAS/Shunts: No atrial level shunt detected by color flow Doppler.  LEFT VENTRICLE PLAX 2D LVIDd:         4.20 cm   Diastology LVIDs:         2.30 cm   LV  e' medial:    8.49 cm/s LV PW:         0.90 cm   LV E/e' medial:  9.1 LV IVS:        1.00 cm   LV e' lateral:   10.80 cm/s LVOT diam:     1.90 cm   LV E/e' lateral: 7.2 LV SV:         52 LV SV Index:   30 LVOT Area:     2.84 cm  RIGHT VENTRICLE             IVC RV S prime:     11.70 cm/s  IVC diam: 1.00 cm TAPSE (M-mode): 1.7 cm                             PULMONARY VEINS                             Diastolic Velocity: 29.20 cm/s  S/D Velocity:       1.30                             Systolic Velocity:  36.90 cm/s LEFT ATRIUM           Index        RIGHT ATRIUM           Index LA diam:      2.90 cm 1.66 cm/m   RA Area:     14.70 cm LA Vol (A2C): 36.6 ml 20.99 ml/m  RA Volume:   35.10 ml  20.13 ml/m LA Vol (A4C): 52.8 ml 30.29 ml/m  AORTIC VALVE AV Area (Vmax): 2.35 cm AV Vmax:        123.00 cm/s AV Peak Grad:   6.1 mmHg LVOT Vmax:      102.00 cm/s LVOT Vmean:     68.800 cm/s LVOT VTI:       0.183 m  AORTA Ao Root diam: 2.30 cm Ao Asc diam:  2.40 cm MITRAL VALVE MV Area (PHT): 4.89 cm    SHUNTS MV Decel Time: 155 msec    Systemic VTI:  0.18 m MV E velocity: 77.60 cm/s  Systemic Diam: 1.90 cm MV A velocity: 59.50 cm/s MV E/A ratio:  1.30 Vina Gull MD Electronically signed by Vina Gull MD Signature Date/Time: 02/27/2024/10:23:53 AM    Final      Nilda Fendt, MD, PhD [facility]  Between 7 am - 7 pm I am available, please contact me via Amion (for emergencies) or Securechat (non urgent messages)  Between 7 pm - 7 am I am not available, please contact night coverage MD/APP via Amion

## 2024-02-28 NOTE — Plan of Care (Signed)
°  Problem: Activity: °Goal: Risk for activity intolerance will decrease °Outcome: Progressing °  °Problem: Nutrition: °Goal: Adequate nutrition will be maintained °Outcome: Progressing °  °Problem: Skin Integrity: °Goal: Risk for impaired skin integrity will decrease °Outcome: Progressing °  °

## 2024-02-28 NOTE — Progress Notes (Incomplete)
 [facility] Infectious Diseases Follow Up Note  Patient Identification: Patient Name: [name] MRN: [id number] Admit Date: 02/23/2024 10:02 AM Age: 38 y.o.Today's Date: 02/28/2024  Reason for Visit: AIDS, pneumonia  Principal Problem:   CAP (community acquired pneumonia) Active Problems:   AIDS (HCC)   Hypoxia   Splenic lesion   Nonadherence to medical treatment   Dyspnea   Weakness of both lower extremities   Viral pneumonia   Antibiotics: Azithromycin  12/3-12/5 Cefepime  12/3, ceftriaxone  12/3-12/7 Fluconazole  12/4 Vancomycin  12/3  Symtuza  12/8- Bactrim  12/8-  Lines/Hardwares:   Interval Events: Remains afebrile No labs today  Assessment 38 year old female with prior history of HIV/AIDS not in care, ART, asthma, postpartum depression who presented to the ED by EMS for generalized weakness, diffuse body ache, cough, fever.  She was apparently feeling not well for a week prior to arrival. Found to have   # Rhinovirus, enterovirus positive # Possible CAP - Clinically improved  - Unlikely to have PJP pneumonia or other opportunistic infections   # HIV/AIDS - Started on Symtuza /Bactrim  which she has been taking with no issues  # Vulvovaginal candidiasis s/p 1 dose of fluconazole  # BV-on vaginal metronidazole  gel  Recommendations - Continue Symtuza  1 tab p.o. daily and bactrim  SS tab daily,  needs 1 month supply on discharge - One dose of IV ceftriaxone  prior to DC today  - Follow-up beta D glucan, HIV genotype, G6PD.  - Universal/standard isolation precautions - Patient has a follow-up with [facility] on 1/6 at 9 am. She however says planning to move to [facility] but plans to keep appt with us .   ID will so, recall back with questions or concerns.   Rest of the management as per the primary team. Thank you for the consult. Please page with pertinent questions or  concerns.  ______________________________________________________________________ Subjective patient seen and examined at the bedside. Feels better. SOB and cough improved   Past Medical History:  Diagnosis Date   Asthma    Depression    Postpartum after 1st pregnancy   Hemorrhoids    HIV positive (HCC)    Past Surgical History:  Procedure Laterality Date   DILATION AND CURETTAGE OF UTERUS     LAPAROSCOPY  01/06/2011   Procedure: LAPAROSCOPY OPERATIVE;  Surgeon: Olam DELENA Mill, MD;  Location: [facility];  Service: Gynecology;  Laterality: N/A;   LAPAROTOMY  01/06/2011   Procedure: LAPAROTOMY;  Surgeon: Olam DELENA Mill, MD;  Location: [facility];  Service: Gynecology;  Laterality: N/A;   SALPINGOOPHORECTOMY     Vitals BP 108/70   Pulse 97   Temp 97.7 F (36.5 C) (Oral)   Resp 18   Ht 5' 2 (1.575 m)   Wt 73 kg   LMP 01/24/2024   SpO2 97%   BMI 29.45 kg/m     Physical Exam Constitutional: Adult female lying in the bed, nontoxic appearing, comfortable    Comments: HEENT WNL and no thrush  Cardiovascular:     Rate and Rhythm: Normal rate     Heart sounds:   Pulmonary:     Effort: Pulmonary effort is normal on room air    Comments:   Abdominal:     Palpations: Abdomen is non distended     Tenderness:  Musculoskeletal:        General: No swelling or tenderness in peripheral joints  Skin:    Comments: No rashes  Neurological:     General: Awake, alert and oriented, grossly nonfocal.  She is ambulatory  Psychiatric:        Mood and Affect: Mood normal.   Pertinent Microbiology Results for orders placed or performed during the hospital encounter of 02/23/24  Culture, blood (routine x 2)     Status: None   Collection Time: 02/23/24 10:16 AM   Specimen: BLOOD  Result Value Ref Range Status   Specimen Description BLOOD RIGHT ANTECUBITAL  Final   Special Requests   Final    BOTTLES DRAWN AEROBIC AND ANAEROBIC Blood Culture adequate volume   Culture    Final    NO GROWTH 5 DAYS Performed at [facility], [address], [place], [place] [address]    Report Status 02/28/2024 FINAL  Final  Resp panel by RT-PCR (RSV, Flu A&B, Covid) Anterior Nasal Swab     Status: None   Collection Time: 02/23/24 10:17 AM   Specimen: Anterior Nasal Swab  Result Value Ref Range Status   SARS Coronavirus 2 by RT PCR NEGATIVE NEGATIVE Final   Influenza A by PCR NEGATIVE NEGATIVE Final   Influenza B by PCR NEGATIVE NEGATIVE Final    Comment: (NOTE) The Xpert Xpress SARS-CoV-2/FLU/RSV plus assay is intended as an aid in the diagnosis of influenza from Nasopharyngeal swab specimens and should not be used as a sole basis for treatment. Nasal washings and aspirates are unacceptable for Xpert Xpress SARS-CoV-2/FLU/RSV testing.  Fact Sheet for Patients: bloggercourse.com  Fact Sheet for Healthcare Providers: seriousbroker.it  This test is not yet approved or cleared by the [place]  FDA and has been authorized for detection and/or diagnosis of SARS-CoV-2 by FDA under an Emergency Use Authorization (EUA). This EUA will remain in effect (meaning this test can be used) for the duration of the COVID-19 declaration under Section 564(b)(1) of the Act, 21 U.S.C. section 360bbb-3(b)(1), unless the authorization is terminated or revoked.     Resp Syncytial Virus by PCR NEGATIVE NEGATIVE Final    Comment: (NOTE) Fact Sheet for Patients: bloggercourse.com  Fact Sheet for Healthcare Providers: seriousbroker.it  This test is not yet approved or cleared by the [place]  FDA and has been authorized for detection and/or diagnosis of SARS-CoV-2 by FDA under an Emergency Use Authorization (EUA). This EUA will remain in effect (meaning this test can be used) for the duration of the COVID-19 declaration under Section 564(b)(1) of the Act, 21 U.S.C. section  360bbb-3(b)(1), unless the authorization is terminated or revoked.  Performed at [facility], [address], [place], [place] [address]   Respiratory (~20 pathogens) panel by PCR     Status: Abnormal   Collection Time: 02/23/24  9:31 PM   Specimen: Nasopharyngeal Swab; Respiratory  Result Value Ref Range Status   Adenovirus NOT DETECTED NOT DETECTED Final   Coronavirus 229E NOT DETECTED NOT DETECTED Final    Comment: (NOTE) The Coronavirus on the Respiratory Panel, DOES NOT test for the novel  Coronavirus (2019 nCoV)    Coronavirus HKU1 NOT DETECTED NOT DETECTED Final   Coronavirus NL63 NOT DETECTED NOT DETECTED Final   Coronavirus OC43 NOT DETECTED NOT DETECTED Final   Metapneumovirus NOT DETECTED NOT DETECTED Final   Rhinovirus / Enterovirus DETECTED (A) NOT DETECTED Final   Influenza A NOT DETECTED NOT DETECTED Final   Influenza B NOT DETECTED NOT DETECTED Final   Parainfluenza Virus 1 NOT DETECTED NOT DETECTED Final   Parainfluenza Virus 2 NOT DETECTED NOT DETECTED Final   Parainfluenza Virus 3 NOT DETECTED NOT DETECTED Final   Parainfluenza Virus 4 NOT  DETECTED NOT DETECTED Final   Respiratory Syncytial Virus NOT DETECTED NOT DETECTED Final   Bordetella pertussis NOT DETECTED NOT DETECTED Final   Bordetella Parapertussis NOT DETECTED NOT DETECTED Final   Chlamydophila pneumoniae NOT DETECTED NOT DETECTED Final   Mycoplasma pneumoniae NOT DETECTED NOT DETECTED Final    Comment: Performed at [facility], [address], [place], [place] [address]  Culture, blood (routine x 2)     Status: None   Collection Time: 02/23/24 10:09 PM   Specimen: BLOOD  Result Value Ref Range Status   Specimen Description BLOOD RIGHT ANTECUBITAL  Final   Special Requests   Final    BOTTLES DRAWN AEROBIC AND ANAEROBIC Blood Culture adequate volume   Culture   Final    NO GROWTH 5 DAYS Performed at [facility], [address], [place], [place] [address]    Report Status  02/28/2024 FINAL  Final   Pertinent Lab.    Latest Ref Rng & Units 02/27/2024    7:29 AM 02/25/2024    5:31 AM 02/23/2024   10:55 AM  CBC  WBC 4.0 - 10.5 K/uL 8.4  3.7    Hemoglobin 12.0 - 15.0 g/dL 9.8  9.1  89.4    89.7   Hematocrit 36.0 - 46.0 % 31.5  29.1  31.0    30.0   Platelets 150 - 400 K/uL 455  416        Latest Ref Rng & Units 02/27/2024    7:29 AM 02/25/2024    5:31 AM 02/23/2024   10:55 AM  CMP  Glucose 70 - 99 mg/dL 76  863  97   BUN 6 - 20 mg/dL 9  8  6    Creatinine 0.44 - 1.00 mg/dL 9.48  9.52  9.19   Sodium 135 - 145 mmol/L 139  138  135    135   Potassium 3.5 - 5.1 mmol/L 3.2  4.1  4.6    4.7   Chloride 98 - 111 mmol/L 103  101  96   CO2 22 - 32 mmol/L 27  27    Calcium 8.9 - 10.3 mg/dL 8.6  8.3    Total Protein 6.5 - 8.1 g/dL  6.7    Total Bilirubin 0.0 - 1.2 mg/dL  <9.7    Alkaline Phos 38 - 126 U/L  72    AST 15 - 41 U/L  17    ALT 0 - 44 U/L  8       Pertinent Imaging today Plain films and CT images have been personally visualized and interpreted; radiology reports have been reviewed. Decision making incorporated into the Impression /   No results found.  I spent 50 minutes involved in face-to-face and non-face-to-face activities for this patient on the day of the visit. Professional time spent includes the following activities: Preparing to see the patient (review of tests), Obtaining and reviewing separately obtained history (hospitalist progress note), Performing a medically appropriate examination and evaluation, Ordering medications/labs, referring and communicating with other health care professionals including Dr. Trixie, Documenting clinical information in the EMR, Independently interpreting results (not separately reported), Communicating results to the patient, Counseling and educating the patient and Care coordination (not separately reported).  Electronically signed by:   Plan d/w requesting provider as well as ID pharm D  Of note,  portions of this note may have been created with voice recognition software. While this note has been edited for accuracy, occasional wrong-word or 'sound-a-like'  substitutions may have occurred due to the inherent limitations of voice recognition software.   Electronically signed by:   Annalee Orem, MD Infectious Disease Physician [facility] Pager: [phone]

## 2024-02-28 NOTE — Telephone Encounter (Signed)
 Pharmacy Patient Advocate Encounter  Insurance verification completed.    The patient is insured through [facility] [place].     Ran test claim for Symtuza  [phone]  tablet and the current 30 day co-pay is $0.   This test claim was processed through [employer]- copay amounts may vary at other pharmacies due to [employer], or as the patient moves through the different stages of their insurance plan.

## 2024-02-28 NOTE — Progress Notes (Signed)
 Occupational Therapy Treatment/Discharge Patient Details Name: [name] MRN: [id number] DOB: June 25, 1985 Today's Date: 02/28/2024   History of present illness 38 y/o Female admitted 02/23/24 with general body ache, tachycardia, rhinovirus, sepsis, CAP.  PMHx: asthma, HIV, depression.   OT comments  Pt in bed upon therapy arrival and agreeable to participate in OT treatment session. Pt reports that she has been completing all bathing/dressing tasks, toileting, and walking to/from the bathroom without physical assistance. She relies on use of RW for balance and for fatigue. Reports that her legs continue to be weak although have improved some. O2 was removed during session to assess SpO2 level which did drop to high 70's/low 80's on RA. O2 was replaced at end of session and SpO2 increased back to 92%. At this time, pt has met all OT goals and there is no further acute OT needs at this time. Recommend continuing to work with PT for mobility needs. Please order OT if pt has any needs in the future.       If plan is discharge home, recommend the following:  Assistance with cooking/housework;Assist for transportation;Help with stairs or ramp for entrance   Equipment Recommendations  BSC/3in1       Precautions / Restrictions Precautions Precautions: Fall;Other (comment) Recall of Precautions/Restrictions: Intact Precaution/Restrictions Comments: watch sats Restrictions Weight Bearing Restrictions Per Provider Order: No       Mobility Bed Mobility Overal bed mobility: Modified Independent     Transfers Overall transfer level: Needs assistance Equipment used: Rolling walker (2 wheels) Transfers: Sit to/from Stand, Bed to chair/wheelchair/BSC Sit to Stand: Supervision     Step pivot transfers: Supervision     General transfer comment: No physical assist required for sit to stand or transfer. Pt reliant on RW for balance. Walked from bed to doorway then to recliner. Slow, labored  pace.     Balance Overall balance assessment: Needs assistance Sitting-balance support: No upper extremity supported, Feet supported Sitting balance-Leahy Scale: Good     Standing balance support: Bilateral upper extremity supported, Reliant on assistive device for balance, During functional activity Standing balance-Leahy Scale: Poor        ADL either performed or assessed with clinical judgement              Communication Communication Communication: No apparent difficulties   Cognition Arousal: Alert Behavior During Therapy: WFL for tasks assessed/performed Cognition: No apparent impairments   Following commands: Intact        Cueing   Cueing Techniques: Verbal cues        General Comments SpO2 92% on 2L [place] O2. Removed O2 while ambulating in room. Once seated in recliner SpO2 read in upper 70s to low 80's. on RA. Once 2L O2 was replaced, SpO2 increased to 92%.    Pertinent Vitals/ Pain       Pain Assessment Pain Assessment: Faces Faces Pain Scale: No hurt            Progress Toward Goals  OT Goals(current goals can now be found in the care plan section)  Progress towards OT goals: Goals met/education completed, patient discharged from OT      AM-PAC OT 6 Clicks Daily Activity     Outcome Measure   Help from another person eating meals?: None Help from another person taking care of personal grooming?: None Help from another person toileting, which includes using toliet, bedpan, or urinal?: None Help from another person bathing (including washing, rinsing, drying)?: None Help from another person  to put on and taking off regular upper body clothing?: None Help from another person to put on and taking off regular lower body clothing?: None 6 Click Score: 24    End of Session Equipment Utilized During Treatment: Rolling walker (2 wheels);Oxygen  OT Visit Diagnosis: Unsteadiness on feet (R26.81);Muscle weakness (generalized) (M62.81)   Activity  Tolerance Patient tolerated treatment well;Patient limited by fatigue   Patient Left in chair;with call bell/phone within reach;with nursing/sitter in room   Nurse Communication Other (comment) (O2 stats during session.)        Time: 1049-1110 OT Time Calculation (min): 21 min  Charges: OT General Charges $OT Visit: 1 Visit OT Treatments $Therapeutic Activity: 8-22 mins  [name], OTR/L,CBIS  Supplemental OT - [facility] and [facility] Secure Chat Preferred    Tiffanee Mcnee, Leita BIRCH 02/28/2024, 12:12 PM

## 2024-02-29 ENCOUNTER — Other Ambulatory Visit (HOSPITAL_COMMUNITY): Payer: Self-pay

## 2024-02-29 MED ORDER — SODIUM CHLORIDE 0.9 % IV SOLN
2.0000 g | Freq: Once | INTRAVENOUS | Status: AC
  Administered 2024-02-29: 2 g via INTRAVENOUS
  Filled 2024-02-29: qty 20

## 2024-02-29 MED ORDER — OXYCODONE HCL 5 MG PO TABS
5.0000 mg | ORAL_TABLET | Freq: Four times a day (QID) | ORAL | 0 refills | Status: AC | PRN
  Filled 2024-02-29: qty 10, 3d supply, fill #0

## 2024-02-29 NOTE — TOC Transition Note (Addendum)
 Transition of Care [facility]) - Discharge Note   Patient Details  Name: CAMIRA GEIDEL MRN: [id number] Date of Birth: Feb 28, 1986  Transition of Care [facility] CM/SW Contact:  Tom-Johnson, Harvest Muskrat, RN Phone Number: 02/29/2024, 11:05 AM   Clinical Narrative:     Patient is scheduled for discharge today.  Readmission Risk Assessment done. Home health info, hospital f/u and discharge instructions on AVS. Prescriptions sent to [facility] pharmacy and patient will receive meds prior discharge. Patient might need a cab voucher if ride is not available to transport at discharge.  No further ICM needs noted.  12:10- DME RW ordered from [employer] and [facility] to deliver to d/c lounge. No further ICM needs noted.     Final next level of care: Home w Home Health Services Barriers to Discharge: Barriers Resolved   Patient Goals and CMS Choice Patient states their goals for this hospitalization and ongoing recovery are:: To return home CMS Medicare.gov Compare Post Acute Care list provided to:: Patient Choice offered to / list presented to : Patient      Discharge Placement                Patient to be transferred to facility by: Patient might need cab voucher at the d/c lounge if her ride is not available.      Discharge Plan and Services Additional resources added to the After Visit Summary for                  DME Arranged: N/A DME Agency: NA       HH Arranged: PT HH Agency: [facility] Date HH Agency Contacted: 02/29/24 Time HH Agency Contacted: 1050 Representative spoke with at [facility] Agency: [facility]  Social Drivers of Health (SDOH) Interventions SDOH Screenings   Food Insecurity: No Food Insecurity (02/24/2024)  Housing: Low Risk  (02/24/2024)  Transportation Needs: No Transportation Needs (02/24/2024)  Utilities: Not At Risk (02/24/2024)  Alcohol Screen: Low Risk  (04/08/2022)  Depression (PHQ2-9): Low Risk  (04/08/2022)  Tobacco Use: Medium Risk  (02/23/2024)     Readmission Risk Interventions    02/25/2024   10:08 AM  Readmission Risk Prevention Plan  Post Dischage Appt Complete  Medication Screening Complete  Transportation Screening Complete

## 2024-02-29 NOTE — Progress Notes (Signed)
 [facility] Infectious Diseases Follow Up Note  Patient Identification: Patient Name: [name] MRN: [id number] Admit Date: 02/23/2024 10:02 AM Age: 38 y.o.Today's Date: 02/29/2024  Reason for Visit: AIDS, pneumonia  Principal Problem:   CAP (community acquired pneumonia) Active Problems:   AIDS (HCC)   Hypoxia   Splenic lesion   Nonadherence to medical treatment   Dyspnea   Weakness of both lower extremities   Viral pneumonia   Antibiotics: Azithromycin  12/3-12/5 Cefepime  12/3, ceftriaxone  12/3- Fluconazole  12/4 Vancomycin  12/3  Symtuza  12/8-  Lines/Hardwares:   Interval Events: Remains afebrile BMP with K3.2, Hb 9.8  Assessment 38 year old female with prior history of HIV/AIDS not in care, ART, asthma, postpartum depression who presented to the ED by EMS for generalized weakness, diffuse body ache, cough, fever.  She was apparently feeling not well for a week prior to arrival. Found to have   # Rhinovirus, enterovirus positive # Possible CAP - Clinically improving - Unlikely to have PJP pneumonia or other opportunistic infections   # HIV/AIDS - Started on Symtuza /Bactrim  which she has been taking with no issues  # Vulvovaginal candidiasis s/p 1 dose of fluconazole  # BV-on vaginal metronidazole  gel  Recommendations - Continue Symtuza  1 tab p.o. daily, needs 1 month supply on discharge - Started on Bactrim  SS 1 tab daily - Continue IV ceftriaxone  - Follow-up beta D glucan, HIV genotype, G6PD.  Integrated genosure ordered - Droplet precautions  - Patient has a follow-up with [facility] on 1/6 at 9 am.   D/w Dr Trixie  Rest of the management as per the primary team. Thank you for the consult. Please page with pertinent questions or concerns.  ______________________________________________________________________ Subjective patient seen and examined at the bedside. Feels better. SOB and cough  improving  Past Medical History:  Diagnosis Date   Asthma    Depression    Postpartum after 1st pregnancy   Hemorrhoids    HIV positive (HCC)    Past Surgical History:  Procedure Laterality Date   DILATION AND CURETTAGE OF UTERUS     LAPAROSCOPY  01/06/2011   Procedure: LAPAROSCOPY OPERATIVE;  Surgeon: Olam DELENA Mill, MD;  Location: [facility];  Service: Gynecology;  Laterality: N/A;   LAPAROTOMY  01/06/2011   Procedure: LAPAROTOMY;  Surgeon: Olam DELENA Mill, MD;  Location: [facility];  Service: Gynecology;  Laterality: N/A;   SALPINGOOPHORECTOMY     Vitals BP 124/81   Pulse 100   Temp 97.6 F (36.4 C)   Resp 18   Ht 5' 2 (1.575 m)   Wt 73 kg   LMP 01/24/2024   SpO2 93%   BMI 29.45 kg/m     Physical Exam Constitutional: Adult female lying in the bed, nontoxic appearing, comfortable    Comments: HEENT WNL and no thrush  Cardiovascular:     Rate and Rhythm: Normal rate     Heart sounds:   Pulmonary:     Effort: Pulmonary effort is normal on [place]    Comments:   Abdominal:     Palpations: Abdomen is non distended     Tenderness:  Musculoskeletal:        General: No swelling or tenderness in peripheral joints  Skin:    Comments: No rashes  Neurological:     General: Awake, alert and oriented, grossly nonfocal.  She is ambulatory  Psychiatric:        Mood and Affect: Mood normal.   Pertinent Microbiology Results for orders placed or performed during the hospital encounter of  02/23/24  Culture, blood (routine x 2)     Status: None   Collection Time: 02/23/24 10:16 AM   Specimen: BLOOD  Result Value Ref Range Status   Specimen Description BLOOD RIGHT ANTECUBITAL  Final   Special Requests   Final    BOTTLES DRAWN AEROBIC AND ANAEROBIC Blood Culture adequate volume   Culture   Final    NO GROWTH 5 DAYS Performed at [facility], [address], [place], [place] [address]    Report Status 02/28/2024 FINAL  Final  Resp panel by RT-PCR (RSV, Flu  A&B, Covid) Anterior Nasal Swab     Status: None   Collection Time: 02/23/24 10:17 AM   Specimen: Anterior Nasal Swab  Result Value Ref Range Status   SARS Coronavirus 2 by RT PCR NEGATIVE NEGATIVE Final   Influenza A by PCR NEGATIVE NEGATIVE Final   Influenza B by PCR NEGATIVE NEGATIVE Final    Comment: (NOTE) The Xpert Xpress SARS-CoV-2/FLU/RSV plus assay is intended as an aid in the diagnosis of influenza from Nasopharyngeal swab specimens and should not be used as a sole basis for treatment. Nasal washings and aspirates are unacceptable for Xpert Xpress SARS-CoV-2/FLU/RSV testing.  Fact Sheet for Patients: bloggercourse.com  Fact Sheet for Healthcare Providers: seriousbroker.it  This test is not yet approved or cleared by the [place]  FDA and has been authorized for detection and/or diagnosis of SARS-CoV-2 by FDA under an Emergency Use Authorization (EUA). This EUA will remain in effect (meaning this test can be used) for the duration of the COVID-19 declaration under Section 564(b)(1) of the Act, 21 U.S.C. section 360bbb-3(b)(1), unless the authorization is terminated or revoked.     Resp Syncytial Virus by PCR NEGATIVE NEGATIVE Final    Comment: (NOTE) Fact Sheet for Patients: bloggercourse.com  Fact Sheet for Healthcare Providers: seriousbroker.it  This test is not yet approved or cleared by the [place]  FDA and has been authorized for detection and/or diagnosis of SARS-CoV-2 by FDA under an Emergency Use Authorization (EUA). This EUA will remain in effect (meaning this test can be used) for the duration of the COVID-19 declaration under Section 564(b)(1) of the Act, 21 U.S.C. section 360bbb-3(b)(1), unless the authorization is terminated or revoked.  Performed at [facility], [address], [place], [place] [address]   Respiratory (~20 pathogens)  panel by PCR     Status: Abnormal   Collection Time: 02/23/24  9:31 PM   Specimen: Nasopharyngeal Swab; Respiratory  Result Value Ref Range Status   Adenovirus NOT DETECTED NOT DETECTED Final   Coronavirus 229E NOT DETECTED NOT DETECTED Final    Comment: (NOTE) The Coronavirus on the Respiratory Panel, DOES NOT test for the novel  Coronavirus (2019 nCoV)    Coronavirus HKU1 NOT DETECTED NOT DETECTED Final   Coronavirus NL63 NOT DETECTED NOT DETECTED Final   Coronavirus OC43 NOT DETECTED NOT DETECTED Final   Metapneumovirus NOT DETECTED NOT DETECTED Final   Rhinovirus / Enterovirus DETECTED (A) NOT DETECTED Final   Influenza A NOT DETECTED NOT DETECTED Final   Influenza B NOT DETECTED NOT DETECTED Final   Parainfluenza Virus 1 NOT DETECTED NOT DETECTED Final   Parainfluenza Virus 2 NOT DETECTED NOT DETECTED Final   Parainfluenza Virus 3 NOT DETECTED NOT DETECTED Final   Parainfluenza Virus 4 NOT DETECTED NOT DETECTED Final   Respiratory Syncytial Virus NOT DETECTED NOT DETECTED Final   Bordetella pertussis NOT DETECTED NOT DETECTED Final   Bordetella Parapertussis NOT  DETECTED NOT DETECTED Final   Chlamydophila pneumoniae NOT DETECTED NOT DETECTED Final   Mycoplasma pneumoniae NOT DETECTED NOT DETECTED Final    Comment: Performed at [facility], [address], [place], [place] [address]  Culture, blood (routine x 2)     Status: None   Collection Time: 02/23/24 10:09 PM   Specimen: BLOOD  Result Value Ref Range Status   Specimen Description BLOOD RIGHT ANTECUBITAL  Final   Special Requests   Final    BOTTLES DRAWN AEROBIC AND ANAEROBIC Blood Culture adequate volume   Culture   Final    NO GROWTH 5 DAYS Performed at [facility], [address] [place], [place] [address]    Report Status 02/28/2024 FINAL  Final   Pertinent Lab.    Latest Ref Rng & Units 02/27/2024    7:29 AM 02/25/2024    5:31 AM 02/23/2024   10:55 AM  CBC  WBC 4.0 - 10.5 K/uL 8.4  3.7     Hemoglobin 12.0 - 15.0 g/dL 9.8  9.1  89.4    89.7   Hematocrit 36.0 - 46.0 % 31.5  29.1  31.0    30.0   Platelets 150 - 400 K/uL 455  416        Latest Ref Rng & Units 02/27/2024    7:29 AM 02/25/2024    5:31 AM 02/23/2024   10:55 AM  CMP  Glucose 70 - 99 mg/dL 76  863  97   BUN 6 - 20 mg/dL 9  8  6    Creatinine 0.44 - 1.00 mg/dL 9.48  9.52  9.19   Sodium 135 - 145 mmol/L 139  138  135    135   Potassium 3.5 - 5.1 mmol/L 3.2  4.1  4.6    4.7   Chloride 98 - 111 mmol/L 103  101  96   CO2 22 - 32 mmol/L 27  27    Calcium 8.9 - 10.3 mg/dL 8.6  8.3    Total Protein 6.5 - 8.1 g/dL  6.7    Total Bilirubin 0.0 - 1.2 mg/dL  <9.7    Alkaline Phos 38 - 126 U/L  72    AST 15 - 41 U/L  17    ALT 0 - 44 U/L  8       Pertinent Imaging today Plain films and CT images have been personally visualized and interpreted; radiology reports have been reviewed. Decision making incorporated into the Impression /   MR BRAIN W WO CONTRAST Result Date: 02/27/2024 EXAM: MRI BRAIN WITH AND WITHOUT CONTRAST 02/27/2024 04:55:25 PM TECHNIQUE: Multiplanar multisequence MRI of the head/brain was performed with and without the administration of intravenous contrast. CONTRAST: 7.5 mL of Gadavist . COMPARISON: None available. CLINICAL HISTORY: Fever, pneumonia, lower extremity weakness. Prominent adenoid hypertrophy is present. FINDINGS: BRAIN AND VENTRICLES: No acute infarct. No acute intracranial hemorrhage. No mass effect or midline shift. No hydrocephalus. The sella is unremarkable. Normal flow voids. No mass or abnormal enhancement. ORBITS: No acute abnormality. SINUSES: No acute abnormality. BONES AND SOFT TISSUES: Normal bone marrow signal and enhancement. Prominent adenoid hypertrophy is present. No acute soft tissue abnormality. IMPRESSION: 1. No acute intracranial abnormality. 2. No mass or abnormal enhancement. Electronically signed by: Lonni Necessary MD 02/27/2024 05:41 PM EST RP Workstation: [id number]    ECHOCARDIOGRAM COMPLETE Result Date: 02/27/2024    ECHOCARDIOGRAM REPORT   Patient Name:   [name] Date of Exam: 02/27/2024 Medical Rec #:  [id number]        Height:       62.0 in Accession #:    [id number]       Weight:       161.0 lb Date of Birth:  1985-11-01        BSA:          1.743 m Patient Age:    38 years         BP:           125/94 mmHg Patient Gender: F                HR:           94 bpm. Exam Location:  Inpatient Procedure: 2D Echo, Cardiac Doppler and Color Doppler (Both Spectral and Color            Flow Doppler were utilized during procedure). Indications:    Fever  History:        Patient has no prior history of Echocardiogram examinations.                 AIDS.  Sonographer:    Sherlean Dubin Referring Phys: [id number] CORNELIUS N VAN DAM  Sonographer Comments: Image acquisition challenging due to respiratory motion. IMPRESSIONS  1. Left ventricular ejection fraction, by estimation, is >75%. The left ventricle has hyperdynamic function. The left ventricle has no regional wall motion abnormalities. Left ventricular diastolic parameters were normal.  2. Right ventricular systolic function is normal. The right ventricular size is normal.  3. The mitral valve is normal in structure. Trivial mitral valve regurgitation.  4. The aortic valve is tricuspid. Aortic valve regurgitation is not visualized.  5. The inferior vena cava is normal in size with <50% respiratory variability, suggesting right atrial pressure of 8 mmHg. FINDINGS  Left Ventricle: Left ventricular ejection fraction, by estimation, is >75%. The left ventricle has hyperdynamic function. The left ventricle has no regional wall motion abnormalities. The left ventricular internal cavity size was normal in size. There is no left ventricular hypertrophy. Left ventricular diastolic parameters were normal. Right Ventricle: The right ventricular size is normal. Right vetricular wall thickness was not assessed. Right ventricular systolic  function is normal. Left Atrium: Left atrial size was normal in size. Right Atrium: Right atrial size was normal in size. Pericardium: Trivial pericardial effusion is present. Mitral Valve: The mitral valve is normal in structure. Trivial mitral valve regurgitation. Tricuspid Valve: The tricuspid valve is normal in structure. Tricuspid valve regurgitation is trivial. Aortic Valve: The aortic valve is tricuspid. Aortic valve regurgitation is not visualized. Aortic valve peak gradient measures 6.1 mmHg. Pulmonic Valve: The pulmonic valve was not well visualized. Pulmonic valve regurgitation is not visualized. Aorta: The aortic root and ascending aorta are structurally normal, with no evidence of dilitation. Venous: The inferior vena cava is normal in size with less than 50% respiratory variability, suggesting right atrial pressure of 8 mmHg. IAS/Shunts: No atrial level shunt detected by color flow Doppler.  LEFT VENTRICLE PLAX 2D LVIDd:         4.20 cm   Diastology LVIDs:         2.30 cm   LV e' medial:    8.49 cm/s LV PW:         0.90 cm   LV E/e' medial:  9.1 LV IVS:        1.00 cm   LV e' lateral:   10.80 cm/s LVOT diam:     1.90  cm   LV E/e' lateral: 7.2 LV SV:         52 LV SV Index:   30 LVOT Area:     2.84 cm  RIGHT VENTRICLE             IVC RV S prime:     11.70 cm/s  IVC diam: 1.00 cm TAPSE (M-mode): 1.7 cm                             PULMONARY VEINS                             Diastolic Velocity: 29.20 cm/s                             S/D Velocity:       1.30                             Systolic Velocity:  36.90 cm/s LEFT ATRIUM           Index        RIGHT ATRIUM           Index LA diam:      2.90 cm 1.66 cm/m   RA Area:     14.70 cm LA Vol (A2C): 36.6 ml 20.99 ml/m  RA Volume:   35.10 ml  20.13 ml/m LA Vol (A4C): 52.8 ml 30.29 ml/m  AORTIC VALVE AV Area (Vmax): 2.35 cm AV Vmax:        123.00 cm/s AV Peak Grad:   6.1 mmHg LVOT Vmax:      102.00 cm/s LVOT Vmean:     68.800 cm/s LVOT VTI:       0.183 m   AORTA Ao Root diam: 2.30 cm Ao Asc diam:  2.40 cm MITRAL VALVE MV Area (PHT): 4.89 cm    SHUNTS MV Decel Time: 155 msec    Systemic VTI:  0.18 m MV E velocity: 77.60 cm/s  Systemic Diam: 1.90 cm MV A velocity: 59.50 cm/s MV E/A ratio:  1.30 Vina Gull MD Electronically signed by Vina Gull MD Signature Date/Time: 02/27/2024/10:23:53 AM    Final    MR Lumbar Spine W Wo Contrast Result Date: 02/26/2024 EXAM: MRI LUMBAR SPINE 02/26/2024 04:39:57 PM TECHNIQUE: Multiplanar multisequence MRI of the lumbar spine was performed without and with the administration of 7.5 mL gadobutrol  (GADAVIST ) 1 MMOL/ML injection. COMPARISON: None available. CLINICAL HISTORY: Myelopathy, acute, lumbar spine. HIV/AIDS. Bilateral lower extremity weakness. Pneumonia. FINDINGS: BONES AND ALIGNMENT: Normal alignment. Normal vertebral body heights. Bone marrow signal is unremarkable. SPINAL CORD: The conus terminates normally. SOFT TISSUES: No paraspinal mass. L1-L2: No significant disc herniation. No spinal canal stenosis or neural foraminal narrowing. L2-L3: No significant disc herniation. No spinal canal stenosis or neural foraminal narrowing. L3-L4: No significant disc herniation. No spinal canal stenosis or neural foraminal narrowing. L4-L5: No significant disc herniation. No spinal canal stenosis or neural foraminal narrowing. L5-S1: No significant disc herniation. No spinal canal stenosis or neural foraminal narrowing. IMPRESSION: 1. No acute or focal lumbar spine abnormality to explain bilateral lower extremity weakness. Electronically signed by: Lonni Necessary MD 02/26/2024 06:33 PM EST RP Workstation: [id number]   CT CHEST ABDOMEN PELVIS W CONTRAST Result Date: 02/25/2024 CLINICAL DATA:  Groin lymphadenopathy. EXAM: CT CHEST, ABDOMEN,  AND PELVIS WITH CONTRAST TECHNIQUE: Multidetector CT imaging of the chest, abdomen and pelvis was performed following the standard protocol during bolus administration of intravenous contrast.  RADIATION DOSE REDUCTION: This exam was performed according to the departmental dose-optimization program which includes automated exposure control, adjustment of the mA and/or kV according to patient size and/or use of iterative reconstruction technique. CONTRAST:  75mL OMNIPAQUE  IOHEXOL  350 MG/ML SOLN COMPARISON:  Abdominopelvic CT 06/17/2023, chest radiograph 02/23/2024 FINDINGS: CT CHEST FINDINGS Cardiovascular: The heart is normal in size. Trace pericardial effusion. No obvious central pulmonary embolus, exam not tailored to pulmonary artery assessment. The thoracic aorta is normal in caliber. Mediastinum/Nodes: No enlarged mediastinal or hilar lymph nodes. No axillary adenopathy. Patulous esophagus without wall thickening. No visible thyroid nodule. Lungs/Pleura: Right greater than left lower lobe consolidation with air bronchograms. Diffuse ground-glass densities with a slightly geographic distribution. Occasional areas of superimposed tree-in-bud opacity and nodularity. Trace pleural effusions. Musculoskeletal: There are no acute or suspicious osseous abnormalities. CT ABDOMEN PELVIS FINDINGS Hepatobiliary: Stable scattered cysts throughout both lobes of the liver. Focal fatty infiltration adjacent to the falciform ligament. No suspicious liver lesion decompressed gallbladder. Pancreas: No ductal dilatation or inflammation. Spleen: Multiple hypodense lesions again seen, slightly increased. Largest lesion measures 4.4 cm, series 5, image 45, previously 4 cm. No splenomegaly. Adrenals/Urinary Tract: The adrenal glands are normal. No hydronephrosis. No renal stones or focal renal lesion. No evidence of renal inflammation. Urinary bladder is decompressed by Foley catheter. Stomach/Bowel: Bowel assessment is limited in the absence of enteric contrast. The stomach is nondistended. No small bowel obstruction or inflammatory change. Fluid/liquid stool throughout the colon. There is no colonic wall thickening or  pericolonic edema. The sigmoid colon is redundant. The appendix is normal. Vascular/Lymphatic: Normal caliber abdominal aorta. Portal and splenic veins are patent. Included inguinal lymph nodes are not enlarged by size criteria and demonstrate normal fatty hila. The largest lymph node is in the left inguinal region measuring 8 mm short axis. No suspicious lymphadenopathy in the abdomen or pelvis. Reproductive: Prominent left periuterine and adnexal vascularity with dilatation of the ovarian vein. The uterus is anteverted. Small amount of fluid in the vagina. No adnexal mass. Other: Trace free fluid in the pelvis. No focal fluid collection. Tiny fat containing umbilical hernia. No ascites. Musculoskeletal: There are no acute or suspicious osseous abnormalities. IMPRESSION: 1. No suspicious lymphadenopathy in the chest, abdomen, or pelvis. 2. Right greater than left lower lobe consolidation with air bronchograms, suspicious for pneumonia. Diffuse ground-glass densities with a slightly geographic distribution, with occasional areas of superimposed tree-in-bud opacity and nodularity. Findings are suspicious for typically infectious. 3. Multiple hypodense lesions in the spleen, slightly increased in size from prior exam. These are nonspecific. Splenic lesions are typically benign, although the possibility of superimposed infection is considered. 4. Fluid/liquid stool throughout the colon, can be seen with diarrheal illness. 5. Prominent left periuterine and adnexal vascularity with dilatation of the ovarian vein, can be seen with pelvic congestion syndrome in the appropriate clinical setting. Electronically Signed   By: Andrea Gasman M.D.   On: 02/25/2024 15:44   DG Chest 2 View Result Date: 02/25/2024 CLINICAL DATA:  fever, decreased SPO2, tachypnea, HIV+ EXAM: CHEST - 2 VIEW COMPARISON:  None available. FINDINGS: Fairly diffuse bilateral perihilar interstitial opacities throughout the lungs. No lobar  consolidation, pleural effusion, or pneumothorax. No cardiomegaly.No acute fracture or destructive lesion. IMPRESSION: Fine reticular opacities throughout both lungs, worrisome for a atypical infection or viral pneumonia. No lobar pneumonia  or pleural effusion. Electronically Signed   By: Rogelia Myers M.D.   On: 02/25/2024 08:32   DG Chest Port 1 View Result Date: 02/23/2024 CLINICAL DATA:  Fever, generalized weakness, tachypnea EXAM: PORTABLE CHEST 1 VIEW COMPARISON:  02/02/2016, 02/20/2024 FINDINGS: Mild bilateral mid and lower lung interstitial opacities, nonspecific for atypical or viral pneumonia versus early edema. Prominent heart size. No effusion or pneumothorax. Trachea midline. No osseous abnormality. IMPRESSION: Mid and lower lung symmetric interstitial opacities, nonspecific for atypical pneumonia or early edema. Electronically Signed   By: CHRISTELLA.  Shick M.D.   On: 02/23/2024 11:01     I spent 50 minutes involved in face-to-face and non-face-to-face activities for this patient on the day of the visit. Professional time spent includes the following activities: Preparing to see the patient (review of tests), Obtaining and reviewing separately obtained history (hospitalist progress note, Dr Fleeta Rothman note), Performing a medically appropriate examination and evaluation, Ordering medications/labs, referring and communicating with other health care professionals including Dr. Trixie, Documenting clinical information in the EMR, Independently interpreting results (not separately reported), Communicating results to the patient, Counseling and educating the patient and Care coordination (not separately reported).  Electronically signed by:   Plan d/w requesting provider as well as ID pharm D  Of note, portions of this note may have been created with voice recognition software. While this note has been edited for accuracy, occasional wrong-word or 'sound-a-like' substitutions may have occurred due to the  inherent limitations of voice recognition software.   Electronically signed by:   Annalee Orem, MD Infectious Disease Physician [facility] Pager: [phone]

## 2024-02-29 NOTE — Progress Notes (Signed)
 Physical Therapy Treatment Patient Details Name: [name] MRN: [id number] DOB: 11/20/1985 Today's Date: 02/29/2024   History of Present Illness 38 y/o Female admitted 02/23/24 with general body ache, tachycardia, rhinovirus, sepsis, CAP.  PMHx: asthma, HIV, depression.    PT Comments  Patient progressing with mobility with SpO2 improved at rest and with mobility on room air.  Still unsteady and unsure so will benefit from HHPT and RW for home.  Noted planned d/c home today.  States kids will help her up steps to her home (declined practice).      If plan is discharge home, recommend the following: A little help with walking and/or transfers;A little help with bathing/dressing/bathroom;Assistance with cooking/housework;Assist for transportation;Help with stairs or ramp for entrance   Can travel by private vehicle        Equipment Recommendations  Rolling walker (2 wheels)    Recommendations for Other Services       Precautions / Restrictions Precautions Precautions: Fall Recall of Precautions/Restrictions: Intact Precaution/Restrictions Comments: watch sats     Mobility  Bed Mobility               General bed mobility comments: seated EOB    Transfers Overall transfer level: Needs assistance Equipment used: None Transfers: Sit to/from Stand Sit to Stand: Supervision           General transfer comment: S for safety, checking SpO2    Ambulation/Gait Ambulation/Gait assistance: Supervision, Contact guard assist Gait Distance (Feet): 50 Feet Assistive device: None (vs occasional furniture use) Gait Pattern/deviations: Step-to pattern, Step-through pattern, Decreased stride length       General Gait Details: slow pace, mild instability and occasionally reaching for wall, furniture, declined RW initially, then felt may benefit from one at home   Stairs Stairs:  (declined stair practice (only a few to get in the home and her kids are big and will  help))           Wheelchair Mobility     Tilt Bed    Modified Rankin (Stroke Patients Only)       Balance Overall balance assessment: Needs assistance   Sitting balance-Leahy Scale: Good       Standing balance-Leahy Scale: Fair                              Hotel Manager: No apparent difficulties  Cognition Arousal: Alert Behavior During Therapy: Flat affect   PT - Cognitive impairments: No apparent impairments                         Following commands: Intact      Cueing Cueing Techniques: Verbal cues  Exercises      General Comments General comments (skin integrity, edema, etc.): on RA SpO2 92% at rest, 88% after ambulation on RA, recovered to 91% in 1 minute; discussed walker at home and pt would like to have one (RNCM aware); discussed energy conservation for home      Pertinent Vitals/Pain Pain Assessment Faces Pain Scale: Hurts little more Pain Location: LBP Pain Descriptors / Indicators: Aching Pain Intervention(s): Monitored during session    Home Living                          Prior Function            PT Goals (current goals can now be  found in the care plan section) Progress towards PT goals: Progressing toward goals    Frequency    Min 2X/week      PT Plan      Co-evaluation              AM-PAC PT 6 Clicks Mobility   Outcome Measure  Help needed turning from your back to your side while in a flat bed without using bedrails?: None Help needed moving from lying on your back to sitting on the side of a flat bed without using bedrails?: None Help needed moving to and from a bed to a chair (including a wheelchair)?: None Help needed standing up from a chair using your arms (e.g., wheelchair or bedside chair)?: A Little Help needed to walk in hospital room?: A Little Help needed climbing 3-5 steps with a railing? : A Little 6 Click Score: 21    End of  Session   Activity Tolerance: Patient limited by fatigue Patient left: in bed;with call bell/phone within reach   PT Visit Diagnosis: Other abnormalities of gait and mobility (R26.89);Difficulty in walking, not elsewhere classified (R26.2)     Time: 8889-8878 PT Time Calculation (min) (ACUTE ONLY): 11 min  Charges:    $Gait Training: 8-22 mins PT General Charges $$ ACUTE PT VISIT: 1 Visit                     Micheline Portal, PT Acute Rehabilitation Services [phone] 02/29/2024    Montie Portal 02/29/2024, 1:09 PM

## 2024-02-29 NOTE — Discharge Summary (Signed)
 Physician Discharge Summary  [name] FMW:[id number] DOB: 05/20/1985 DOA: 02/23/2024  PCP: Gareth Mliss FALCON, FNP  Admit date: 02/23/2024 Discharge date: 02/29/2024  Admitted From: home Disposition:  home  Recommendations for Outpatient Follow-up:  Follow up with PCP in 1-2 weeks Follow up with ID as an outpatient  Home Health: none Equipment/Devices: none  Discharge Condition: stable CODE STATUS: Full code Diet Orders (From admission, onward)     Start     Ordered   02/24/24 1809  Diet regular Room service appropriate? Yes; Fluid consistency: Thin  Diet effective now       Question Answer Comment  Room service appropriate? Yes   Fluid consistency: Thin      02/24/24 1809           Brief Narrative / Interim history: 38 year old patient with HIV, asthma, depression, recently diagnosed pneumonia and on antibiotics comes into the hospital with persistent fever, chills, fatigue and generalized weakness.  She tells me that when EMS arrived at her home she was too weak that she could not ambulate.  X-ray on admission showed multifocal pneumonia, she was placed on antibiotics and admitted to the hospital.  RVP was positive for rhinovirus.  ID was consulted in the setting of underlying HIV as she was not taking her ART   Patient does not want her diagnoses and medical care disclosed to any family members  Hospital Course / Discharge diagnoses: Principal problem Sepsis due to multifocal pneumonia, rhinovirus infection -her pneumonia is probably due to rhinovirus infection, however she is at risk for multiple other pathogens given very low CD4 count. ID consulted, appreciate input, followed patient while hospitalized.   Active problems HIV-has not been taking her ART, CD4, undetectably low, viral load 17,000.  Management per ID, started on Symtuza  Acute hypoxic respiratory failure, asthma exacerbation-patient was hypoxemic on arrival requiring 2 L supplemental oxygen, also  tachypneic with respiratory rate up to mid 30s.  Felt like she was wheezing on admission, and was started on steroids.  Wheezing now resolved, weaned off steroids, weaned off to room air. CT scan of the chest, abdomen and pelvis confirms pneumonia, no other acute findings Lower extremity weakness -bilateral, also with inability to urinate and urinary retention.  MR of the lumbar spine as well as MRI of the brain are both unremarkable.  She is now able to mobilize better, Foley has been removed and urinating on her own Presumed bacterial vaginosis, vulvovaginal candidiasis-on vaginal metronidazole , received fluconazole  x 1  Discharge Instructions  Allergies as of 02/29/2024       Reactions   Benadryl  [diphenhydramine ] Itching   Penicillins Other (See Comments)   Unknown reaction        Medication List     STOP taking these medications    azithromycin  250 MG tablet Commonly known as: ZITHROMAX    cefixime  400 MG Caps capsule Commonly known as: SUPRAX    fluconazole  150 MG tablet Commonly known as: Diflucan        TAKE these medications    albuterol  108 (90 Base) MCG/ACT inhaler Commonly known as: VENTOLIN  HFA Inhale 2 puffs into the lungs every 6 (six) hours as needed for wheezing or shortness of breath (Cough).   Darunavir -Cobicistat-Emtricitabine -Tenofovir  Alafenamide 800-150-200-10 MG Tabs Commonly known as: SYMTUZA  Take 1 tablet by mouth daily with breakfast.   metroNIDAZOLE  0.75 % vaginal gel Commonly known as: METROGEL  Insert vaginally using applicator nightly for 5 nights.  Abstain from sexual intercourse or tampon use until treatment is complete.  ondansetron  4 MG disintegrating tablet Commonly known as: ZOFRAN -ODT Take 1 tablet (4 mg total) by mouth every 8 (eight) hours as needed for nausea or vomiting.   oxyCODONE  5 MG immediate release tablet Commonly known as: Oxy IR/ROXICODONE  Take 1 tablet (5 mg total) by mouth every 6 (six) hours as needed for moderate  pain (pain score 4-6) or severe pain (pain score 7-10).   sulfamethoxazole -trimethoprim  400-80 MG tablet Commonly known as: BACTRIM  Take 1 tablet by mouth daily.   Womens Multivitamin Tabs Take 1 tablet by mouth daily.       Consultations: ID  Procedures/Studies:  MR BRAIN W WO CONTRAST Result Date: 02/27/2024 EXAM: MRI BRAIN WITH AND WITHOUT CONTRAST 02/27/2024 04:55:25 PM TECHNIQUE: Multiplanar multisequence MRI of the head/brain was performed with and without the administration of intravenous contrast. CONTRAST: 7.5 mL of Gadavist . COMPARISON: None available. CLINICAL HISTORY: Fever, pneumonia, lower extremity weakness. Prominent adenoid hypertrophy is present. FINDINGS: BRAIN AND VENTRICLES: No acute infarct. No acute intracranial hemorrhage. No mass effect or midline shift. No hydrocephalus. The sella is unremarkable. Normal flow voids. No mass or abnormal enhancement. ORBITS: No acute abnormality. SINUSES: No acute abnormality. BONES AND SOFT TISSUES: Normal bone marrow signal and enhancement. Prominent adenoid hypertrophy is present. No acute soft tissue abnormality. IMPRESSION: 1. No acute intracranial abnormality. 2. No mass or abnormal enhancement. Electronically signed by: Lonni Necessary MD 02/27/2024 05:41 PM EST RP Workstation: [id number]   ECHOCARDIOGRAM COMPLETE Result Date: 02/27/2024    ECHOCARDIOGRAM REPORT   Patient Name:   [name] Date of Exam: 02/27/2024 Medical Rec #:  [id number]        Height:       62.0 in Accession #:    [id number]       Weight:       161.0 lb Date of Birth:  1986/02/22        BSA:          1.743 m Patient Age:    38 years         BP:           125/94 mmHg Patient Gender: F                HR:           94 bpm. Exam Location:  Inpatient Procedure: 2D Echo, Cardiac Doppler and Color Doppler (Both Spectral and Color            Flow Doppler were utilized during procedure). Indications:    Fever  History:        Patient has no prior history of  Echocardiogram examinations.                 AIDS.  Sonographer:    Sherlean Dubin Referring Phys: [id number] CORNELIUS N VAN DAM  Sonographer Comments: Image acquisition challenging due to respiratory motion. IMPRESSIONS  1. Left ventricular ejection fraction, by estimation, is >75%. The left ventricle has hyperdynamic function. The left ventricle has no regional wall motion abnormalities. Left ventricular diastolic parameters were normal.  2. Right ventricular systolic function is normal. The right ventricular size is normal.  3. The mitral valve is normal in structure. Trivial mitral valve regurgitation.  4. The aortic valve is tricuspid. Aortic valve regurgitation is not visualized.  5. The inferior vena cava is normal in size with <50% respiratory variability, suggesting right atrial pressure of 8 mmHg. FINDINGS  Left Ventricle: Left ventricular ejection fraction, by estimation, is >75%. The left ventricle has  hyperdynamic function. The left ventricle has no regional wall motion abnormalities. The left ventricular internal cavity size was normal in size. There is no left ventricular hypertrophy. Left ventricular diastolic parameters were normal. Right Ventricle: The right ventricular size is normal. Right vetricular wall thickness was not assessed. Right ventricular systolic function is normal. Left Atrium: Left atrial size was normal in size. Right Atrium: Right atrial size was normal in size. Pericardium: Trivial pericardial effusion is present. Mitral Valve: The mitral valve is normal in structure. Trivial mitral valve regurgitation. Tricuspid Valve: The tricuspid valve is normal in structure. Tricuspid valve regurgitation is trivial. Aortic Valve: The aortic valve is tricuspid. Aortic valve regurgitation is not visualized. Aortic valve peak gradient measures 6.1 mmHg. Pulmonic Valve: The pulmonic valve was not well visualized. Pulmonic valve regurgitation is not visualized. Aorta: The aortic root and ascending  aorta are structurally normal, with no evidence of dilitation. Venous: The inferior vena cava is normal in size with less than 50% respiratory variability, suggesting right atrial pressure of 8 mmHg. IAS/Shunts: No atrial level shunt detected by color flow Doppler.  LEFT VENTRICLE PLAX 2D LVIDd:         4.20 cm   Diastology LVIDs:         2.30 cm   LV e' medial:    8.49 cm/s LV PW:         0.90 cm   LV E/e' medial:  9.1 LV IVS:        1.00 cm   LV e' lateral:   10.80 cm/s LVOT diam:     1.90 cm   LV E/e' lateral: 7.2 LV SV:         52 LV SV Index:   30 LVOT Area:     2.84 cm  RIGHT VENTRICLE             IVC RV S prime:     11.70 cm/s  IVC diam: 1.00 cm TAPSE (M-mode): 1.7 cm                             PULMONARY VEINS                             Diastolic Velocity: 29.20 cm/s                             S/D Velocity:       1.30                             Systolic Velocity:  36.90 cm/s LEFT ATRIUM           Index        RIGHT ATRIUM           Index LA diam:      2.90 cm 1.66 cm/m   RA Area:     14.70 cm LA Vol (A2C): 36.6 ml 20.99 ml/m  RA Volume:   35.10 ml  20.13 ml/m LA Vol (A4C): 52.8 ml 30.29 ml/m  AORTIC VALVE AV Area (Vmax): 2.35 cm AV Vmax:        123.00 cm/s AV Peak Grad:   6.1 mmHg LVOT Vmax:      102.00 cm/s LVOT Vmean:     68.800 cm/s LVOT VTI:  0.183 m  AORTA Ao Root diam: 2.30 cm Ao Asc diam:  2.40 cm MITRAL VALVE MV Area (PHT): 4.89 cm    SHUNTS MV Decel Time: 155 msec    Systemic VTI:  0.18 m MV E velocity: 77.60 cm/s  Systemic Diam: 1.90 cm MV A velocity: 59.50 cm/s MV E/A ratio:  1.30 Vina Gull MD Electronically signed by Vina Gull MD Signature Date/Time: 02/27/2024/10:23:53 AM    Final    MR Lumbar Spine W Wo Contrast Result Date: 02/26/2024 EXAM: MRI LUMBAR SPINE 02/26/2024 04:39:57 PM TECHNIQUE: Multiplanar multisequence MRI of the lumbar spine was performed without and with the administration of 7.5 mL gadobutrol  (GADAVIST ) 1 MMOL/ML injection. COMPARISON: None available.  CLINICAL HISTORY: Myelopathy, acute, lumbar spine. HIV/AIDS. Bilateral lower extremity weakness. Pneumonia. FINDINGS: BONES AND ALIGNMENT: Normal alignment. Normal vertebral body heights. Bone marrow signal is unremarkable. SPINAL CORD: The conus terminates normally. SOFT TISSUES: No paraspinal mass. L1-L2: No significant disc herniation. No spinal canal stenosis or neural foraminal narrowing. L2-L3: No significant disc herniation. No spinal canal stenosis or neural foraminal narrowing. L3-L4: No significant disc herniation. No spinal canal stenosis or neural foraminal narrowing. L4-L5: No significant disc herniation. No spinal canal stenosis or neural foraminal narrowing. L5-S1: No significant disc herniation. No spinal canal stenosis or neural foraminal narrowing. IMPRESSION: 1. No acute or focal lumbar spine abnormality to explain bilateral lower extremity weakness. Electronically signed by: Lonni Necessary MD 02/26/2024 06:33 PM EST RP Workstation: [id number]   CT CHEST ABDOMEN PELVIS W CONTRAST Result Date: 02/25/2024 CLINICAL DATA:  Groin lymphadenopathy. EXAM: CT CHEST, ABDOMEN, AND PELVIS WITH CONTRAST TECHNIQUE: Multidetector CT imaging of the chest, abdomen and pelvis was performed following the standard protocol during bolus administration of intravenous contrast. RADIATION DOSE REDUCTION: This exam was performed according to the departmental dose-optimization program which includes automated exposure control, adjustment of the mA and/or kV according to patient size and/or use of iterative reconstruction technique. CONTRAST:  75mL OMNIPAQUE  IOHEXOL  350 MG/ML SOLN COMPARISON:  Abdominopelvic CT 06/17/2023, chest radiograph 02/23/2024 FINDINGS: CT CHEST FINDINGS Cardiovascular: The heart is normal in size. Trace pericardial effusion. No obvious central pulmonary embolus, exam not tailored to pulmonary artery assessment. The thoracic aorta is normal in caliber. Mediastinum/Nodes: No enlarged  mediastinal or hilar lymph nodes. No axillary adenopathy. Patulous esophagus without wall thickening. No visible thyroid nodule. Lungs/Pleura: Right greater than left lower lobe consolidation with air bronchograms. Diffuse ground-glass densities with a slightly geographic distribution. Occasional areas of superimposed tree-in-bud opacity and nodularity. Trace pleural effusions. Musculoskeletal: There are no acute or suspicious osseous abnormalities. CT ABDOMEN PELVIS FINDINGS Hepatobiliary: Stable scattered cysts throughout both lobes of the liver. Focal fatty infiltration adjacent to the falciform ligament. No suspicious liver lesion decompressed gallbladder. Pancreas: No ductal dilatation or inflammation. Spleen: Multiple hypodense lesions again seen, slightly increased. Largest lesion measures 4.4 cm, series 5, image 45, previously 4 cm. No splenomegaly. Adrenals/Urinary Tract: The adrenal glands are normal. No hydronephrosis. No renal stones or focal renal lesion. No evidence of renal inflammation. Urinary bladder is decompressed by Foley catheter. Stomach/Bowel: Bowel assessment is limited in the absence of enteric contrast. The stomach is nondistended. No small bowel obstruction or inflammatory change. Fluid/liquid stool throughout the colon. There is no colonic wall thickening or pericolonic edema. The sigmoid colon is redundant. The appendix is normal. Vascular/Lymphatic: Normal caliber abdominal aorta. Portal and splenic veins are patent. Included inguinal lymph nodes are not enlarged by size criteria and demonstrate normal fatty hila. The largest lymph  node is in the left inguinal region measuring 8 mm short axis. No suspicious lymphadenopathy in the abdomen or pelvis. Reproductive: Prominent left periuterine and adnexal vascularity with dilatation of the ovarian vein. The uterus is anteverted. Small amount of fluid in the vagina. No adnexal mass. Other: Trace free fluid in the pelvis. No focal fluid  collection. Tiny fat containing umbilical hernia. No ascites. Musculoskeletal: There are no acute or suspicious osseous abnormalities. IMPRESSION: 1. No suspicious lymphadenopathy in the chest, abdomen, or pelvis. 2. Right greater than left lower lobe consolidation with air bronchograms, suspicious for pneumonia. Diffuse ground-glass densities with a slightly geographic distribution, with occasional areas of superimposed tree-in-bud opacity and nodularity. Findings are suspicious for typically infectious. 3. Multiple hypodense lesions in the spleen, slightly increased in size from prior exam. These are nonspecific. Splenic lesions are typically benign, although the possibility of superimposed infection is considered. 4. Fluid/liquid stool throughout the colon, can be seen with diarrheal illness. 5. Prominent left periuterine and adnexal vascularity with dilatation of the ovarian vein, can be seen with pelvic congestion syndrome in the appropriate clinical setting. Electronically Signed   By: Andrea Gasman M.D.   On: 02/25/2024 15:44   DG Chest 2 View Result Date: 02/25/2024 CLINICAL DATA:  fever, decreased SPO2, tachypnea, HIV+ EXAM: CHEST - 2 VIEW COMPARISON:  None available. FINDINGS: Fairly diffuse bilateral perihilar interstitial opacities throughout the lungs. No lobar consolidation, pleural effusion, or pneumothorax. No cardiomegaly.No acute fracture or destructive lesion. IMPRESSION: Fine reticular opacities throughout both lungs, worrisome for a atypical infection or viral pneumonia. No lobar pneumonia or pleural effusion. Electronically Signed   By: Rogelia Myers M.D.   On: 02/25/2024 08:32   DG Chest Port 1 View Result Date: 02/23/2024 CLINICAL DATA:  Fever, generalized weakness, tachypnea EXAM: PORTABLE CHEST 1 VIEW COMPARISON:  02/02/2016, 02/20/2024 FINDINGS: Mild bilateral mid and lower lung interstitial opacities, nonspecific for atypical or viral pneumonia versus early edema. Prominent  heart size. No effusion or pneumothorax. Trachea midline. No osseous abnormality. IMPRESSION: Mid and lower lung symmetric interstitial opacities, nonspecific for atypical pneumonia or early edema. Electronically Signed   By: CHRISTELLA.  Shick M.D.   On: 02/23/2024 11:01     Subjective: - no chest pain, shortness of breath, no abdominal pain, nausea or vomiting.   Discharge Exam: BP 124/81   Pulse 100   Temp 97.6 F (36.4 C)   Resp 18   Ht 5' 2 (1.575 m)   Wt 73 kg   LMP 01/24/2024   SpO2 93%   BMI 29.45 kg/m   General: Pt is alert, awake, not in acute distress Cardiovascular: RRR, S1/S2 +, no rubs, no gallops Respiratory: CTA bilaterally, no wheezing, no rhonchi Abdominal: Soft, NT, ND, bowel sounds + Extremities: no edema, no cyanosis    The results of significant diagnostics from this hospitalization (including imaging, microbiology, ancillary and laboratory) are listed below for reference.     Microbiology: Recent Results (from the past 240 hours)  Urine Culture     Status: Abnormal   Collection Time: 02/20/24  5:57 PM   Specimen: Urine, Clean Catch  Result Value Ref Range Status   Specimen Description URINE, CLEAN CATCH  Final   Special Requests   Final    NONE Performed at [facility], [address], [place], [place] [address]    Culture MULTIPLE SPECIES PRESENT, SUGGEST RECOLLECTION (A)  Final   Report Status 02/21/2024 FINAL  Final  Culture, blood (routine x 2)  Status: None   Collection Time: 02/23/24 10:16 AM   Specimen: BLOOD  Result Value Ref Range Status   Specimen Description BLOOD RIGHT ANTECUBITAL  Final   Special Requests   Final    BOTTLES DRAWN AEROBIC AND ANAEROBIC Blood Culture adequate volume   Culture   Final    NO GROWTH 5 DAYS Performed at [facility], [address], [place], [place] [address]    Report Status 02/28/2024 FINAL  Final  Resp panel by RT-PCR (RSV, Flu A&B, Covid) Anterior Nasal Swab     Status: None    Collection Time: 02/23/24 10:17 AM   Specimen: Anterior Nasal Swab  Result Value Ref Range Status   SARS Coronavirus 2 by RT PCR NEGATIVE NEGATIVE Final   Influenza A by PCR NEGATIVE NEGATIVE Final   Influenza B by PCR NEGATIVE NEGATIVE Final    Comment: (NOTE) The Xpert Xpress SARS-CoV-2/FLU/RSV plus assay is intended as an aid in the diagnosis of influenza from Nasopharyngeal swab specimens and should not be used as a sole basis for treatment. Nasal washings and aspirates are unacceptable for Xpert Xpress SARS-CoV-2/FLU/RSV testing.  Fact Sheet for Patients: bloggercourse.com  Fact Sheet for Healthcare Providers: seriousbroker.it  This test is not yet approved or cleared by the [place]  FDA and has been authorized for detection and/or diagnosis of SARS-CoV-2 by FDA under an Emergency Use Authorization (EUA). This EUA will remain in effect (meaning this test can be used) for the duration of the COVID-19 declaration under Section 564(b)(1) of the Act, 21 U.S.C. section 360bbb-3(b)(1), unless the authorization is terminated or revoked.     Resp Syncytial Virus by PCR NEGATIVE NEGATIVE Final    Comment: (NOTE) Fact Sheet for Patients: bloggercourse.com  Fact Sheet for Healthcare Providers: seriousbroker.it  This test is not yet approved or cleared by the [place]  FDA and has been authorized for detection and/or diagnosis of SARS-CoV-2 by FDA under an Emergency Use Authorization (EUA). This EUA will remain in effect (meaning this test can be used) for the duration of the COVID-19 declaration under Section 564(b)(1) of the Act, 21 U.S.C. section 360bbb-3(b)(1), unless the authorization is terminated or revoked.  Performed at [facility], [address], [place], [place] [address]   Respiratory (~20 pathogens) panel by PCR     Status: Abnormal   Collection  Time: 02/23/24  9:31 PM   Specimen: Nasopharyngeal Swab; Respiratory  Result Value Ref Range Status   Adenovirus NOT DETECTED NOT DETECTED Final   Coronavirus 229E NOT DETECTED NOT DETECTED Final    Comment: (NOTE) The Coronavirus on the Respiratory Panel, DOES NOT test for the novel  Coronavirus (2019 nCoV)    Coronavirus HKU1 NOT DETECTED NOT DETECTED Final   Coronavirus NL63 NOT DETECTED NOT DETECTED Final   Coronavirus OC43 NOT DETECTED NOT DETECTED Final   Metapneumovirus NOT DETECTED NOT DETECTED Final   Rhinovirus / Enterovirus DETECTED (A) NOT DETECTED Final   Influenza A NOT DETECTED NOT DETECTED Final   Influenza B NOT DETECTED NOT DETECTED Final   Parainfluenza Virus 1 NOT DETECTED NOT DETECTED Final   Parainfluenza Virus 2 NOT DETECTED NOT DETECTED Final   Parainfluenza Virus 3 NOT DETECTED NOT DETECTED Final   Parainfluenza Virus 4 NOT DETECTED NOT DETECTED Final   Respiratory Syncytial Virus NOT DETECTED NOT DETECTED Final   Bordetella pertussis NOT DETECTED NOT DETECTED Final   Bordetella Parapertussis NOT DETECTED NOT DETECTED Final   Chlamydophila pneumoniae NOT DETECTED NOT  DETECTED Final   Mycoplasma pneumoniae NOT DETECTED NOT DETECTED Final    Comment: Performed at [facility], [address], [place], [place] [address]  Culture, blood (routine x 2)     Status: None   Collection Time: 02/23/24 10:09 PM   Specimen: BLOOD  Result Value Ref Range Status   Specimen Description BLOOD RIGHT ANTECUBITAL  Final   Special Requests   Final    BOTTLES DRAWN AEROBIC AND ANAEROBIC Blood Culture adequate volume   Culture   Final    NO GROWTH 5 DAYS Performed at [facility], [address] [place], [place] [address]    Report Status 02/28/2024 FINAL  Final     Labs: Basic Metabolic Panel: Recent Labs  Lab 02/23/24 1035 02/23/24 1055 02/25/24 0531 02/27/24 0729  NA 137 135  135 138 139  K 3.4* 4.7  4.6 4.1 3.2*  CL 97* 96* 101 103  CO2 28  --   27 27  GLUCOSE 98 97 136* 76  BUN 5* 6 8 9   CREATININE 0.82 0.80 0.47 0.51  CALCIUM 8.4*  --  8.3* 8.6*  MG  --   --  2.0 1.9   Liver Function Tests: Recent Labs  Lab 02/23/24 1035 02/25/24 0531  AST 23 17  ALT 8 8  ALKPHOS 76 72  BILITOT 0.6 <0.2  PROT 7.0 6.7  ALBUMIN 1.8* 1.6*   CBC: Recent Labs  Lab 02/23/24 1035 02/23/24 1055 02/25/24 0531 02/27/24 0729  WBC 8.6  --  3.7* 8.4  NEUTROABS 6.9  --   --   --   HGB 9.5* 10.2*  10.5* 9.1* 9.8*  HCT 30.8* 30.0*  31.0* 29.1* 31.5*  MCV 84.6  --  83.4 84.2  PLT 373  --  416* 455*   CBG: No results for input(s): GLUCAP in the last 168 hours. Hgb A1c No results for input(s): HGBA1C in the last 72 hours. Lipid Profile No results for input(s): CHOL, HDL, LDLCALC, TRIG, CHOLHDL, LDLDIRECT in the last 72 hours. Thyroid function studies No results for input(s): TSH, T4TOTAL, T3FREE, THYROIDAB in the last 72 hours.  Invalid input(s): FREET3 Urinalysis    Component Value Date/Time   COLORURINE AMBER (A) 02/24/2024 1100   APPEARANCEUR CLEAR 02/24/2024 1100   LABSPEC >1.030 (H) 02/24/2024 1100   PHURINE 6.0 02/24/2024 1100   GLUCOSEU NEGATIVE 02/24/2024 1100   HGBUR NEGATIVE 02/24/2024 1100   BILIRUBINUR NEGATIVE 02/24/2024 1100   BILIRUBINUR small (A) 02/20/2024 1747   KETONESUR 15 (A) 02/24/2024 1100   PROTEINUR 100 (A) 02/24/2024 1100   UROBILINOGEN 0.2 02/20/2024 1747   UROBILINOGEN 1.0 01/22/2020 1359   NITRITE NEGATIVE 02/24/2024 1100   LEUKOCYTESUR NEGATIVE 02/24/2024 1100    FURTHER DISCHARGE INSTRUCTIONS:   Get Medicines reviewed and adjusted: Please take all your medications with you for your next visit with your Primary MD   Laboratory/radiological data: Please request your Primary MD to go over all hospital tests and procedure/radiological results at the follow up, please ask your Primary MD to get all Hospital records sent to his/her office.   In some cases, they will be  blood work, cultures and biopsy results pending at the time of your discharge. Please request that your primary care M.D. goes through all the records of your hospital data and follows up on these results.   Also Note the following: If you experience worsening of your admission symptoms, develop shortness of breath, life threatening emergency, suicidal or  homicidal thoughts you must seek medical attention immediately by calling 911 or calling your MD immediately  if symptoms less severe.   You must read complete instructions/literature along with all the possible adverse reactions/side effects for all the Medicines you take and that have been prescribed to you. Take any new Medicines after you have completely understood and accpet all the possible adverse reactions/side effects.    Do not drive when taking Pain medications or sleeping medications (Benzodaizepines)   Do not take more than prescribed Pain, Sleep and Anxiety Medications. It is not advisable to combine anxiety,sleep and pain medications without talking with your primary care practitioner   Special Instructions: If you have smoked or chewed Tobacco  in the last 2 yrs please stop smoking, stop any regular Alcohol  and or any Recreational drug use.   Wear Seat belts while driving.   Please note: You were cared for by a hospitalist during your hospital stay. Once you are discharged, your primary care physician will handle any further medical issues. Please note that NO REFILLS for any discharge medications will be authorized once you are discharged, as it is imperative that you return to your primary care physician (or establish a relationship with a primary care physician if you do not have one) for your post hospital discharge needs so that they can reassess your need for medications and monitor your lab values.  Time coordinating discharge: 35 minutes  SIGNED:  Nilda Fendt, MD, PhD 02/29/2024, 9:48 AM

## 2024-03-01 LAB — FUNGITELL BETA-D-GLUCAN

## 2024-03-01 LAB — GLUCOSE 6 PHOSPHATE DEHYDROGENASE
G6PDH: 9.1 U/g{Hb} (ref 4.7–14.6)
Hemoglobin: 9.6 g/dL — ABNORMAL LOW (ref 11.1–15.9)

## 2024-03-07 ENCOUNTER — Telehealth: Payer: Self-pay

## 2024-03-07 NOTE — Telephone Encounter (Signed)
 Copied from CRM #[id number]. Topic: Clinical - Home Health Verbal Orders >> Mar 07, 2024  3:05 PM Amber H wrote: Caller/Agency: Jerel from  [facility]  Callback Number: [phone] Service Requested: Physical Therapy Frequency: 1x pr week for 7 weeks  Any new concerns about the patient? Yes, D/c from the hospital 02/29/2024 due to pnemonia.

## 2024-03-08 NOTE — Telephone Encounter (Signed)
 VO given.

## 2024-03-11 LAB — HIV GENOSURE(R) MG

## 2024-03-11 LAB — HIV-1 RNA, PCR (GRAPH) RFX/GENO EDI
HIV-1 RNA BY PCR: 11900 {copies}/mL
HIV-1 RNA Quant, Log: 4.076 {Log_copies}/mL

## 2024-03-11 LAB — REFLEX TO GENOSURE(R) MG EDI

## 2024-03-12 LAB — HIV GENOSURE REFLEX - HIVGTY - ELECTRONIC RECORD

## 2024-03-13 ENCOUNTER — Telehealth: Payer: Self-pay

## 2024-03-13 NOTE — Telephone Encounter (Signed)
 Vo given to [employer]

## 2024-03-13 NOTE — Telephone Encounter (Unsigned)
 Copied from CRM #[id number]. Topic: Clinical - Home Health Verbal Orders >> Mar 07, 2024  3:05 PM Amber H wrote: Caller/Agency: Jerel from  [facility]  Callback Number: [phone] Service Requested: Physical Therapy Frequency: 1x pr week for 7 weeks  Any new concerns about the patient? Yes, D/c from the hospital 02/29/2024 due to pnemonia. >> Mar 13, 2024  3:29 PM Emylou G wrote: Jerel w/[facility].. [phone] after assessment last week w/medication interaction:  oxycodone  5mg  and symtuza  ( they do have interaction with each other )  >> Mar 13, 2024  9:34 AM Leonette SQUIBB wrote: Physical Therapist called back this morning asking about this verbal request.

## 2024-03-13 NOTE — Telephone Encounter (Signed)
 Copied from CRM #[id number]. Topic: Clinical - Home Health Verbal Orders >> Mar 07, 2024  3:05 PM Amber H wrote: Caller/Agency: Jerel from  [facility]  Callback Number: [phone] Service Requested: Physical Therapy Frequency: 1x pr week for 7 weeks  Any new concerns about the patient? Yes, D/c from the hospital 02/29/2024 due to pnemonia. >> Mar 13, 2024  9:34 AM Leonette SQUIBB wrote: Physical Therapist called back this morning asking about this verbal request.

## 2024-03-21 LAB — GENOSURE INTEGRASE HIV EDI: HIV Genosure Integrase PDF 2: 1

## 2024-03-28 ENCOUNTER — Other Ambulatory Visit (HOSPITAL_COMMUNITY)
Admission: RE | Admit: 2024-03-28 | Discharge: 2024-03-28 | Disposition: A | Discharge status: Home / Self Care | Source: Ambulatory Visit | Attending: Infectious Diseases | Admitting: Infectious Diseases

## 2024-03-28 ENCOUNTER — Encounter: Payer: Self-pay | Admitting: Infectious Diseases

## 2024-03-28 ENCOUNTER — Ambulatory Visit: Payer: Self-pay | Admitting: Infectious Diseases

## 2024-03-28 ENCOUNTER — Other Ambulatory Visit: Payer: Self-pay

## 2024-03-28 VITALS — BP 131/93 | HR 89 | Temp 97.8°F | Ht 62.0 in | Wt 172.0 lb

## 2024-03-28 DIAGNOSIS — Z113 Encounter for screening for infections with a predominantly sexual mode of transmission: Secondary | ICD-10-CM

## 2024-03-28 DIAGNOSIS — B20 Human immunodeficiency virus [HIV] disease: Secondary | ICD-10-CM | POA: Diagnosis present

## 2024-03-28 DIAGNOSIS — Z79899 Other long term (current) drug therapy: Secondary | ICD-10-CM

## 2024-03-28 DIAGNOSIS — Z0000 Encounter for general adult medical examination without abnormal findings: Secondary | ICD-10-CM | POA: Insufficient documentation

## 2024-03-28 DIAGNOSIS — Z7185 Encounter for immunization safety counseling: Secondary | ICD-10-CM | POA: Insufficient documentation

## 2024-03-28 DIAGNOSIS — Z2821 Immunization not carried out because of patient refusal: Secondary | ICD-10-CM

## 2024-03-28 LAB — T-HELPER CELLS (CD4) COUNT (NOT AT ARMC)
CD4 % Helper T Cell: 8 % — ABNORMAL LOW (ref 33–65)
CD4 T Cell Abs: 84 /uL — ABNORMAL LOW (ref 400–1790)

## 2024-03-28 MED ORDER — SULFAMETHOXAZOLE-TRIMETHOPRIM 400-80 MG PO TABS: 1.0000 | ORAL_TABLET | Freq: Every day | ORAL | 5 refills | Status: AC

## 2024-03-28 MED ORDER — DARUN-COBIC-EMTRICIT-TENOFAF 800-150-200-10 MG PO TABS: 1.0000 | ORAL_TABLET | Freq: Every day | ORAL | 5 refills | Status: AC

## 2024-03-28 NOTE — Progress Notes (Unsigned)
 "                                                                                                                                                                                                      [address], [place], [place], [address]                                                                  Phn. [phone]; Fax: [phone]                                                                             Date: 03/28/24  Reason for Visit: HIV follow up/re-engage in care  HPI: [name] is a 39 y.o.old female with a history of HIV/AIDs, Asthma, Depression who is here for for HFU after recent hospital admission 12/3-12/9 for possible CAP.   Compliant with symtuza  and bactrim , denies missed doses or concerns. Feeling better. Declined flu vaccine. Planning to move to [facility] at the end of this month, plan to make a fu in 6 weeks unless she moves there.   Outpatient Encounter Medications as of 03/28/2024  Medication Sig Note   albuterol  (VENTOLIN  HFA) 108 (90 Base) MCG/ACT inhaler Inhale 2 puffs into the lungs every 6 (six) hours as needed for wheezing or shortness of breath (Cough).    Multiple Vitamins-Minerals (WOMENS MULTIVITAMIN) TABS Take 1 tablet by mouth daily.    ondansetron  (ZOFRAN -ODT) 4 MG disintegrating tablet Take 1 tablet (4 mg total) by mouth every 8 (eight) hours as needed for nausea or vomiting.    oxyCODONE  (OXY IR/ROXICODONE ) 5 MG immediate release tablet Take 1 tablet (5 mg total) by mouth every 6 (six) hours as needed for moderate pain (pain score 4-6) or severe pain (pain score 7-10).    Darunavir -Cobicistat-Emtricitabine -Tenofovir  Alafenamide (SYMTUZA ) 800-150-200-10 MG TABS Take 1 tablet by mouth daily with breakfast.    metroNIDAZOLE  (METROGEL ) 0.75 % vaginal gel Insert vaginally using applicator nightly for 5 nights.  Abstain from sexual intercourse or tampon use until treatment is complete. (Patient not  taking: Reported on 03/28/2024) 02/23/2024: No fill history  found; patient endorses picking up and starting the medication. 5 day course; patient has used for 1 night.   sulfamethoxazole -trimethoprim  (BACTRIM ) 400-80 MG tablet Take 1 tablet by mouth daily.    [DISCONTINUED] Darunavir -Cobicistat-Emtricitabine -Tenofovir  Alafenamide (SYMTUZA ) 800-150-200-10 MG TABS Take 1 tablet by mouth daily with breakfast.    [DISCONTINUED] sulfamethoxazole -trimethoprim  (BACTRIM ) 400-80 MG tablet Take 1 tablet by mouth daily.    No facility-administered encounter medications on file as of 03/28/2024.   Allergies[1]  Past Medical History:  Diagnosis Date   Asthma    Depression    Postpartum after 1st pregnancy   Hemorrhoids    HIV positive (HCC)    Past Surgical History:  Procedure Laterality Date   DILATION AND CURETTAGE OF UTERUS     LAPAROSCOPY  01/06/2011   Procedure: LAPAROSCOPY OPERATIVE;  Surgeon: Olam DELENA Mill, MD;  Location: [facility];  Service: Gynecology;  Laterality: N/A;   LAPAROTOMY  01/06/2011   Procedure: LAPAROTOMY;  Surgeon: Olam DELENA Mill, MD;  Location: [facility];  Service: Gynecology;  Laterality: N/A;   SALPINGOOPHORECTOMY     Social History   Socioeconomic History   Marital status: Single    Spouse name: Not on file   Number of children: Not on file   Years of education: Not on file   Highest education level: Not on file  Occupational History   Not on file  Tobacco Use   Smoking status: Former    Current packs/day: 0.00    Average packs/day: 0.5 packs/day for 7.0 years (3.5 ttl pk-yrs)    Types: Cigarettes    Start date: 03/28/2004    Quit date: 03/29/2011    Years since quitting: 13.0   Smokeless tobacco: Never  Vaping Use   Vaping status: Never Used  Substance and Sexual Activity   Alcohol use: No   Drug use: No    Comment: denies mj use   Sexual activity: Yes    Birth control/protection: Condom, None  Other Topics Concern   Not on file  Social History Narrative   Not on file   Social Drivers of Health    Tobacco Use: Medium Risk (03/28/2024)   Patient History    Smoking Tobacco Use: Former    Smokeless Tobacco Use: Never    Passive Exposure: Not on Actuary Strain: Not on file  Food Insecurity: No Food Insecurity (02/24/2024)   Epic    Worried About Programme Researcher, Broadcasting/film/video in the Last Year: Never true    Ran Out of Food in the Last Year: Never true  Transportation Needs: No Transportation Needs (02/24/2024)   Epic    Lack of Transportation (Medical): No    Lack of Transportation (Non-Medical): No  Physical Activity: Not on file  Stress: Not on file  Social Connections: Not on file  Intimate Partner Violence: Not At Risk (02/24/2024)   Epic    Fear of Current or Ex-Partner: No    Emotionally Abused: No    Physically Abused: No    Sexually Abused: No  Depression (PHQ2-9): Low Risk (03/28/2024)   Depression (PHQ2-9)    PHQ-2 Score: 0  Alcohol Screen: Low Risk (04/08/2022)   Alcohol Screen    Last Alcohol Screening Score (AUDIT): 0  Housing: Low Risk (02/24/2024)   Epic    Unable to Pay for Housing in the Last Year: No    Number of Times Moved in the Last Year: 0  Homeless in the Last Year: No  Utilities: Not At Risk (02/24/2024)   Epic    Threatened with loss of utilities: No  Health Literacy: Not on file   Family History  Problem Relation Age of Onset   Diabetes Mother    Hypertension Mother    Hyperlipidemia Mother    Kidney disease Mother    Diabetes Brother    Diabetes Maternal Aunt    Hypertension Maternal Aunt    Asthma Daughter    Vitals BP (!) 131/93   Pulse 89   Temp 97.8 F (36.6 C)   Ht 5' 2 (1.575 m)   Wt 172 lb (78 kg)   SpO2 99%   BMI 31.46 kg/m    Examination  Gen: Awake, Alert and oriented, no acute distress HEENT: [place]/AT, no scleral icterus, no pale conjunctivae, hearing normal, oral mucosa moist Neck: Supple Cardio: Normal HR; +S1 and S2 Resp: Normal Breath Sounds  GI: non-distended, soft  GU: Musc: Extremities: No  edema Skin: No rashes Neuro: grossly non focal  Psych: Calm, cooperative  Lab Results HIV 1 RNA Quant (copies/mL)  Date Value  02/23/2024 17,000  04/24/2014 <20  10/05/2013 <20   CD4 T Cell Abs (/uL)  Date Value  02/23/2024 <35 (L)  04/24/2014 540  10/05/2013 570   No results found for: HIV1GENOSEQ Lab Results  Component Value Date   WBC 8.4 02/27/2024   HGB 9.6 (L) 02/28/2024   HCT 31.5 (L) 02/27/2024   MCV 84.2 02/27/2024   PLT 455 (H) 02/27/2024    Lab Results  Component Value Date   CREATININE 0.51 02/27/2024   BUN 9 02/27/2024   NA 139 02/27/2024   K 3.2 (L) 02/27/2024   CL 103 02/27/2024   CO2 27 02/27/2024   Lab Results  Component Value Date   ALT 8 02/25/2024   AST 17 02/25/2024   ALKPHOS 72 02/25/2024   BILITOT <0.2 02/25/2024    Lab Results  Component Value Date   CHOL 151 04/08/2022   TRIG 121 04/08/2022   HDL 41 (L) 04/08/2022   LDLCALC 88 04/08/2022   Lab Results  Component Value Date   HAV NEG 07/19/2009   Lab Results  Component Value Date   HEPBSAG NEGATIVE 04/01/2016   HEPBSAB POS (A) 07/19/2009   Lab Results  Component Value Date   HCVAB NEG 07/19/2009   Lab Results  Component Value Date   CHLAMYDIAWP Negative 02/25/2024   N Negative 02/25/2024   Lab Results  Component Value Date   GCPROBEAPT NEGATIVE 10/25/2013   No results found for: QUANTGOLD  02/25/24 HIV genosure   Component Ref Range & Units (hover) 4 wk ago  Integrase - Mutation Summary Comment  Comment: (NOTE) Mutations Detected: L74I  Bictegravir Sensitive  Comment: Associated Mutations: L74I  Dolutegravir  Resistance Sensitive  Comment: Associated Mutations: L74I  Elvitegravir Resistance Sensitive  Comment: Associated Mutations: L74I  Raltegravir Resistance Sensitive  Comment: (NOTE) Associated Mutations: L74I   Component Ref Range & Units (hover) 1 mo ago  [place]ade/Subtype B  NNRTI - Mutation Summary Comment  Comment: (NOTE) Mutations  Detected: E138A  Sustiva (Efavirenz) Sensitive  Comment: No Mutations Detected  Viramune (Nevirapine) Sensitive  Comment: No Mutations Detected  Intelence(Etravirine) Sensitive  Comment: Associated Mutations: E138A  Edurant (Rilpivirine) Resistant  Comment: Associated Mutations: E138A  Doravirine (Pifeltro) Sensitive  Comment: Associated Mutations: E138A  NRTI - Mutation Summary Comment  Comment: No Mutations Detected  Epivir  (Lamivudine ) Sensitive  Comment: No Mutations Detected  Retrovir  (Zidovudine ) Sensitive  Comment: No Mutations Detected  Videx (Didanosine) Sensitive  Comment: No Mutations Detected  Zerit (Stavudine) Sensitive  Comment: No Mutations Detected  Ziagen  (Abacavir ) Sensitive  Comment: No Mutations Detected  Viread  (Tenofovir  (TDF)) Sensitive  Comment: No Mutations Detected  Emtriva  (Emtricitabine ) Sensitive  Comment: No Mutations Detected  PI - Mutation Summary Comment  Comment: (NOTE) Mutations Detected: I62V  Lexiva/r (Fosamprenavir/r) Sensitive  Comment: No Mutations Detected  Crixivan/r (Indinavir/r) Sensitive  Comment: No Mutations Detected  Invirase/r (Saquinavir/r) Sensitive  Comment: Associated Mutations: I62V  Norvir  (Ritonavir ) Sensitive  Comment: No Mutations Detected  Viracept (Nelfinavir) Sensitive  Comment: No Mutations Detected  Kaletra (Lopinavir/r) Sensitive  Comment: No Mutations Detected  Reyataz /r (Atazanavir /r) Sensitive  Comment: Associated Mutations: I62V  Aptivus/r (Tipranavir/r) Sensitive  Comment: No Mutations Detected  Prezista /r (Darunavir /r) Sensitive  Comment: No Mutations Detected  RT Mutations Summary Comment  Comment: (NOTE) Mutations Detected: RT: V35M, T39A, Q102K, E138A, R211K, P243S, E248N, V276T,    V292I, V293I, E297E/A/K/T, A304A/E, K311R  PR Mutations Summary Comment  Comment: (NOTE) Mutations Detected: PR: T12P, L19Q, M36M/T, N37C, R41K, I62V, L63P, V77I GenoSure MG HIV is a DNA sequence assay based  on primer extension and chain termination that analyzes the protease (amino acids 1-99) and reverse transcriptase (amino acids 1-320) coding regions in HIV-1. This test is validated for testing specimens with HIV-1 viral loads equal to or above 500 copies/mL and should be interpreted only on such specimens. This test was developed and its performance characteristics determined by [facility]. It has not been cleared or approved by the Food and Drug Administration. [employer] is a subsidiary of [employer], using the brand [facility]. The results should not be used as the sole criteria for patient management. This document contains private and confidential health information protected by state and federal law. If you have received this document in error, please call [phone]. Performed At: [facility] [address] [place], [place] [id number] Josepha Force MD Ey:[id number]   Health Maintenance: Immunization History  Administered Date(s) Administered   Influenza Split 01/08/2011   Influenza,inj,Quad PF,6+ Mos 02/28/2013, 04/01/2016   Influenza-Unspecified 04/07/2015   PPD Test 09/05/2010   Pneumococcal Polysaccharide-23 09/05/2010   Tdap 07/07/2016   Assessment/Plan: # HIV/AIDs - Adherence assessed, side effects reviewed/discussed and DDIs reviewed  - Continue Symtuza  1 tab p.o. daily, Bactrim  SS 1 tab p.o. daily for PJP prophylaxis. Meds refilled  - Labs today - Follow-up in 6 weeks  # STD screening  - Urine GC + RPR  # Immunization  - declined including flu   # Healthcare maintenance - Hepatitis A, B and C serology - QuantiFERON, toxoplasma serology - 04/01/2016 pap smear negative for intraepithelial lesions or malignancy, HPV not detected.  - Dental care to be discussed    I personally spent a total of 30  minutes in the care of the patient today including preparing to see the patient,  getting/reviewing separately obtained history, performing a medically appropriate exam/evaluation, counseling and educating, placing orders, documenting clinical information in the EHR, independently interpreting results, and communicating results. Electronically signed by:  Annalee Orem, MD Infectious Disease Physician [facility] [address] [place], [place] [address] Phone: [phone]  Fax: [phone]      [1]  Allergies Allergen Reactions   Benadryl  [Diphenhydramine ] Itching   Penicillins Other (See Comments)    Unknown reaction   "

## 2024-03-29 LAB — URINE CYTOLOGY ANCILLARY ONLY
Chlamydia: NEGATIVE
Comment: NEGATIVE
Comment: NORMAL
Neisseria Gonorrhea: NEGATIVE

## 2024-03-31 ENCOUNTER — Ambulatory Visit: Payer: Self-pay | Admitting: Infectious Diseases

## 2024-03-31 LAB — LIPID PANEL
Cholesterol: 190 mg/dL
HDL: 69 mg/dL
LDL Cholesterol (Calc): 106 mg/dL — ABNORMAL HIGH
Non-HDL Cholesterol (Calc): 121 mg/dL
Total CHOL/HDL Ratio: 2.8 (calc)
Triglycerides: 61 mg/dL

## 2024-03-31 LAB — HEPATITIS B SURFACE ANTIBODY,QUALITATIVE: Hep B S Ab: REACTIVE — AB

## 2024-03-31 LAB — COMPREHENSIVE METABOLIC PANEL WITH GFR
AG Ratio: 1.1 (calc) (ref 1.0–2.5)
ALT: 7 U/L (ref 6–29)
AST: 13 U/L (ref 10–30)
Albumin: 3.9 g/dL (ref 3.6–5.1)
Alkaline phosphatase (APISO): 140 U/L — ABNORMAL HIGH (ref 31–125)
BUN: 7 mg/dL (ref 7–25)
CO2: 30 mmol/L (ref 20–32)
Calcium: 9.3 mg/dL (ref 8.6–10.2)
Chloride: 104 mmol/L (ref 98–110)
Creat: 0.63 mg/dL (ref 0.50–0.97)
Globulin: 3.5 g/dL (ref 1.9–3.7)
Glucose, Bld: 90 mg/dL (ref 65–99)
Potassium: 4.1 mmol/L (ref 3.5–5.3)
Sodium: 138 mmol/L (ref 135–146)
Total Bilirubin: 0.3 mg/dL (ref 0.2–1.2)
Total Protein: 7.4 g/dL (ref 6.1–8.1)
eGFR: 117 mL/min/1.73m2

## 2024-03-31 LAB — HEPATITIS A ANTIBODY, TOTAL: Hepatitis A AB,Total: REACTIVE — AB

## 2024-03-31 LAB — QUANTIFERON-TB GOLD PLUS
Mitogen-NIL: 5.15 [IU]/mL
NIL: 0.04 [IU]/mL
QuantiFERON-TB Gold Plus: NEGATIVE
TB1-NIL: 0 [IU]/mL
TB2-NIL: 0 [IU]/mL

## 2024-03-31 LAB — HIV RNA, RTPCR W/R GT (RTI, PI,INT)
HIV 1 RNA Quant: 324 {copies}/mL — ABNORMAL HIGH
HIV-1 RNA Quant, Log: 2.51 {Log_copies}/mL — ABNORMAL HIGH

## 2024-03-31 LAB — HEPATITIS C ANTIBODY: Hepatitis C Ab: NONREACTIVE

## 2024-03-31 LAB — SYPHILIS: RPR W/REFLEX TO RPR TITER AND TREPONEMAL ANTIBODIES, TRADITIONAL SCREENING AND DIAGNOSIS ALGORITHM: RPR Ser Ql: NONREACTIVE

## 2024-03-31 LAB — HEPATITIS B CORE ANTIBODY, TOTAL: Hep B Core Total Ab: NONREACTIVE

## 2024-03-31 LAB — TOXOPLASMA GONDII ANTIBODY, IGG: Toxoplasma IgG Ratio: <7.2 [IU]/mL

## 2024-03-31 LAB — HEPATITIS B SURFACE ANTIGEN: Hepatitis B Surface Ag: NONREACTIVE

## 2024-05-11 ENCOUNTER — Ambulatory Visit: Payer: Self-pay | Admitting: Infectious Diseases
